# Patient Record
Sex: Female | Born: 1980
Health system: Southern US, Community
[De-identification: ages and names within clinical notes are randomized; demographics above are authoritative.]

## PROBLEM LIST (undated history)

## (undated) ENCOUNTER — Inpatient Hospital Stay (HOSPITAL_COMMUNITY): Payer: Self-pay

## (undated) DIAGNOSIS — E669 Obesity, unspecified: Secondary | ICD-10-CM

## (undated) DIAGNOSIS — R519 Headache, unspecified: Secondary | ICD-10-CM

## (undated) DIAGNOSIS — Z87891 Personal history of nicotine dependence: Secondary | ICD-10-CM

## (undated) DIAGNOSIS — K219 Gastro-esophageal reflux disease without esophagitis: Secondary | ICD-10-CM

## (undated) DIAGNOSIS — IMO0002 Reserved for concepts with insufficient information to code with codable children: Secondary | ICD-10-CM

## (undated) DIAGNOSIS — R87629 Unspecified abnormal cytological findings in specimens from vagina: Secondary | ICD-10-CM

## (undated) DIAGNOSIS — K449 Diaphragmatic hernia without obstruction or gangrene: Secondary | ICD-10-CM

## (undated) DIAGNOSIS — R51 Headache: Secondary | ICD-10-CM

## (undated) DIAGNOSIS — E079 Disorder of thyroid, unspecified: Secondary | ICD-10-CM

## (undated) DIAGNOSIS — B009 Herpesviral infection, unspecified: Secondary | ICD-10-CM

## (undated) DIAGNOSIS — F32A Depression, unspecified: Secondary | ICD-10-CM

## (undated) DIAGNOSIS — Z8669 Personal history of other diseases of the nervous system and sense organs: Secondary | ICD-10-CM

## (undated) DIAGNOSIS — T7840XA Allergy, unspecified, initial encounter: Secondary | ICD-10-CM

## (undated) HISTORY — PX: CHOLECYSTECTOMY: SHX55

## (undated) HISTORY — DX: Obesity, unspecified: E66.9

## (undated) HISTORY — DX: Depression, unspecified: F32.A

## (undated) HISTORY — DX: Allergy, unspecified, initial encounter: T78.40XA

## (undated) HISTORY — DX: Disorder of thyroid, unspecified: E07.9

## (undated) HISTORY — DX: Diaphragmatic hernia without obstruction or gangrene: K44.9

## (undated) HISTORY — DX: Personal history of other diseases of the nervous system and sense organs: Z86.69

## (undated) HISTORY — DX: Personal history of nicotine dependence: Z87.891

## (undated) HISTORY — DX: Gastro-esophageal reflux disease without esophagitis: K21.9

## (undated) HISTORY — DX: Herpesviral infection, unspecified: B00.9

## (undated) HISTORY — PX: ESOPHAGOGASTRODUODENOSCOPY: SHX1529

## (undated) HISTORY — DX: Reserved for concepts with insufficient information to code with codable children: IMO0002

---

## 2000-11-18 DIAGNOSIS — R87619 Unspecified abnormal cytological findings in specimens from cervix uteri: Secondary | ICD-10-CM

## 2000-11-18 DIAGNOSIS — IMO0002 Reserved for concepts with insufficient information to code with codable children: Secondary | ICD-10-CM

## 2000-11-18 HISTORY — PX: TONSILLECTOMY AND ADENOIDECTOMY: SHX28

## 2000-11-18 HISTORY — PX: COLPOSCOPY: SHX161

## 2000-11-18 HISTORY — DX: Unspecified abnormal cytological findings in specimens from cervix uteri: R87.619

## 2000-11-18 HISTORY — DX: Reserved for concepts with insufficient information to code with codable children: IMO0002

## 2002-11-18 HISTORY — PX: CHOLECYSTECTOMY, LAPAROSCOPIC: SHX56

## 2004-01-18 ENCOUNTER — Other Ambulatory Visit: Admission: RE | Admit: 2004-01-18 | Discharge: 2004-01-18 | Payer: Self-pay | Admitting: Obstetrics and Gynecology

## 2004-07-20 ENCOUNTER — Other Ambulatory Visit: Admission: RE | Admit: 2004-07-20 | Discharge: 2004-07-20 | Payer: Self-pay | Admitting: *Deleted

## 2005-03-19 ENCOUNTER — Other Ambulatory Visit: Admission: RE | Admit: 2005-03-19 | Discharge: 2005-03-19 | Payer: Self-pay | Admitting: Obstetrics and Gynecology

## 2006-02-27 ENCOUNTER — Other Ambulatory Visit: Admission: RE | Admit: 2006-02-27 | Discharge: 2006-02-27 | Payer: Self-pay | Admitting: Obstetrics & Gynecology

## 2006-04-25 ENCOUNTER — Other Ambulatory Visit: Admission: RE | Admit: 2006-04-25 | Discharge: 2006-04-25 | Payer: Self-pay | Admitting: Obstetrics & Gynecology

## 2007-04-27 ENCOUNTER — Other Ambulatory Visit: Admission: RE | Admit: 2007-04-27 | Discharge: 2007-04-27 | Payer: Self-pay | Admitting: Obstetrics and Gynecology

## 2007-07-17 ENCOUNTER — Other Ambulatory Visit: Admission: RE | Admit: 2007-07-17 | Discharge: 2007-07-17 | Payer: Self-pay | Admitting: Obstetrics and Gynecology

## 2007-10-28 ENCOUNTER — Other Ambulatory Visit: Admission: RE | Admit: 2007-10-28 | Discharge: 2007-10-28 | Payer: Self-pay | Admitting: Obstetrics and Gynecology

## 2008-05-02 ENCOUNTER — Other Ambulatory Visit: Admission: RE | Admit: 2008-05-02 | Discharge: 2008-05-02 | Payer: Self-pay | Admitting: Obstetrics and Gynecology

## 2010-02-16 DIAGNOSIS — B009 Herpesviral infection, unspecified: Secondary | ICD-10-CM

## 2010-02-16 HISTORY — DX: Herpesviral infection, unspecified: B00.9

## 2011-01-17 ENCOUNTER — Other Ambulatory Visit: Payer: Self-pay | Admitting: Gastroenterology

## 2011-01-17 DIAGNOSIS — R1012 Left upper quadrant pain: Secondary | ICD-10-CM

## 2011-01-22 ENCOUNTER — Ambulatory Visit
Admission: RE | Admit: 2011-01-22 | Discharge: 2011-01-22 | Disposition: A | Discharge status: Home / Self Care | Payer: BC Managed Care – PPO | Source: Ambulatory Visit | Attending: Gastroenterology | Admitting: Gastroenterology

## 2011-01-22 DIAGNOSIS — R1012 Left upper quadrant pain: Secondary | ICD-10-CM

## 2011-01-22 MED ORDER — IOHEXOL 300 MG/ML  SOLN
100.0000 mL | Freq: Once | INTRAMUSCULAR | Status: AC | PRN
  Administered 2011-01-22: 100 mL via INTRAVENOUS

## 2011-11-17 ENCOUNTER — Ambulatory Visit (INDEPENDENT_AMBULATORY_CARE_PROVIDER_SITE_OTHER): Payer: BC Managed Care – PPO

## 2011-11-17 DIAGNOSIS — J029 Acute pharyngitis, unspecified: Secondary | ICD-10-CM

## 2011-11-17 DIAGNOSIS — J069 Acute upper respiratory infection, unspecified: Secondary | ICD-10-CM

## 2012-07-24 ENCOUNTER — Ambulatory Visit (INDEPENDENT_AMBULATORY_CARE_PROVIDER_SITE_OTHER): Payer: BC Managed Care – PPO | Admitting: Physician Assistant

## 2012-07-24 VITALS — BP 103/67 | HR 59 | Temp 97.8°F | Resp 16 | Ht 63.0 in | Wt 145.0 lb

## 2012-07-24 DIAGNOSIS — R21 Rash and other nonspecific skin eruption: Secondary | ICD-10-CM

## 2012-07-24 DIAGNOSIS — J029 Acute pharyngitis, unspecified: Secondary | ICD-10-CM

## 2012-07-24 LAB — POCT CBC
Granulocyte percent: 58 %G (ref 37–80)
HCT, POC: 46.3 % (ref 37.7–47.9)
Hemoglobin: 14.4 g/dL (ref 12.2–16.2)
Lymph, poc: 2.9 (ref 0.6–3.4)
MCH, POC: 30.4 pg (ref 27–31.2)
MCHC: 31.1 g/dL — AB (ref 31.8–35.4)
MCV: 97.7 fL — AB (ref 80–97)
MID (cbc): 0.7 (ref 0–0.9)
MPV: 8.9 fL (ref 0–99.8)
POC Granulocyte: 5 (ref 2–6.9)
POC LYMPH PERCENT: 33.9 %L (ref 10–50)
POC MID %: 8.1 %M (ref 0–12)
Platelet Count, POC: 379 10*3/uL (ref 142–424)
RBC: 4.74 M/uL (ref 4.04–5.48)
RDW, POC: 12.7 %
WBC: 8.6 10*3/uL (ref 4.6–10.2)

## 2012-07-24 MED ORDER — DOXYCYCLINE HYCLATE 100 MG PO TABS: 100.0000 mg | ORAL_TABLET | Freq: Two times a day (BID) | ORAL | Status: DC

## 2012-07-24 NOTE — Progress Notes (Signed)
Subjective:    Patient ID: [name] [name], female    DOB: November 27, 1980, 31 y.o.   MRN: [id number]  HPI Pt presents to clinic with several problems that she is not sure if they are related.  About 2 wks ago she developed cold like symptoms with congestion and sore throat with fatigue and 24h fever at 101F.  The symptoms have mostly resolved but she still has intermittent sore throat throughout the day.  About the same time frame she noticed a bump on her L side that had a dark center but ignored it.  Then over the last couple of days since she has not been feeling normal she relooked at the spot on her L side and noticed that the rash was bigger but the dark spot was gone.  She then got concerned that the dark area might have been a tick.  She is not really having myalgias.   Review of Systems  Constitutional: Positive for fever (resolved) and fatigue. Negative for chills.  HENT: Positive for congestion (improved) and sore throat (improved no exposures to strep known).   Respiratory: Negative for cough.   Skin: Positive for rash (no itching or pain).  Neurological: Negative for headaches.       Objective:   Physical Exam  Vitals reviewed. Constitutional: She is oriented to person, place, and time. She appears well-developed and well-nourished.  HENT:  Head: Normocephalic and atraumatic.  Right Ear: Hearing, tympanic membrane, external ear and ear canal normal.  Left Ear: Hearing, tympanic membrane, external ear and ear canal normal.  Nose: Nose normal.  Mouth/Throat: Uvula is midline.  Neck: Neck supple.  Cardiovascular: Normal rate, regular rhythm and normal heart sounds.  Exam reveals no gallop.   No murmur heard. Pulmonary/Chest: Effort normal and breath sounds normal.  Lymphadenopathy:    She has cervical adenopathy (AC nodes 1+ nontender).  Neurological: She is alert and oriented to person, place, and time.  Skin: Skin is warm and dry. Petechiae and rash noted. Rash is  urticarial.     Psychiatric: She has a normal mood and affect. Her behavior is normal. Judgment and thought content normal.     Results for orders placed in visit on 07/24/12  POCT CBC      Component Value Range   WBC 8.6  4.6 - 10.2 K/uL   Lymph, poc 2.9  0.6 - 3.4   POC LYMPH PERCENT 33.9  10 - 50 %L   MID (cbc) 0.7  0 - 0.9   POC MID % 8.1  0 - 12 %M   POC Granulocyte 5.0  2 - 6.9   Granulocyte percent 58.0  37 - 80 %G   RBC 4.74  4.04 - 5.48 M/uL   Hemoglobin 14.4  12.2 - 16.2 g/dL   HCT, POC 78.4  69.6 - 47.9 %   MCV 97.7 (*) 80 - 97 fL   MCH, POC 30.4  27 - 31.2 pg   MCHC 31.1 (*) 31.8 - 35.4 g/dL   RDW, POC 29.5     Platelet Count, POC 379  142 - 424 K/uL   MPV 8.9  0 - 99.8 fL      Assessment & Plan:   1. Rash  POCT CBC, doxycycline (VIBRA-TABS) 100 MG tablet  2. Sore throat  POCT CBC   I think pt did have a tick bite even though she did not pull off the tick so I will cover for Lyme disease.  Pt can use OTC  antihistamine for allergic reaction.  Can continue to use OTC hydrocortisone crm.

## 2012-07-26 ENCOUNTER — Ambulatory Visit (INDEPENDENT_AMBULATORY_CARE_PROVIDER_SITE_OTHER): Payer: BC Managed Care – PPO | Admitting: Family Medicine

## 2012-07-26 VITALS — BP 121/77 | HR 76 | Temp 98.1°F | Resp 16 | Ht 63.0 in | Wt 144.0 lb

## 2012-07-26 DIAGNOSIS — T148 Other injury of unspecified body region: Secondary | ICD-10-CM

## 2012-07-26 DIAGNOSIS — R21 Rash and other nonspecific skin eruption: Secondary | ICD-10-CM

## 2012-07-26 DIAGNOSIS — A6920 Lyme disease, unspecified: Secondary | ICD-10-CM

## 2012-07-26 DIAGNOSIS — W57XXXA Bitten or stung by nonvenomous insect and other nonvenomous arthropods, initial encounter: Secondary | ICD-10-CM

## 2012-07-26 LAB — POCT CBC
Granulocyte percent: 68.8 %G (ref 37–80)
HCT, POC: 46.2 % (ref 37.7–47.9)
Hemoglobin: 14.3 g/dL (ref 12.2–16.2)
Lymph, poc: 2.3 (ref 0.6–3.4)
MCH, POC: 30.1 pg (ref 27–31.2)
MCHC: 31 g/dL — AB (ref 31.8–35.4)
MCV: 97.3 fL — AB (ref 80–97)
MID (cbc): 0.6 (ref 0–0.9)
MPV: 8.9 fL (ref 0–99.8)
POC Granulocyte: 6.4 (ref 2–6.9)
POC LYMPH PERCENT: 24.8 %L (ref 10–50)
POC MID %: 6.4 %M (ref 0–12)
Platelet Count, POC: 360 10*3/uL (ref 142–424)
RBC: 4.75 M/uL (ref 4.04–5.48)
RDW, POC: 13.1 %
WBC: 9.3 10*3/uL (ref 4.6–10.2)

## 2012-07-26 MED ORDER — DOXYCYCLINE HYCLATE 100 MG PO TABS: 100.0000 mg | ORAL_TABLET | Freq: Two times a day (BID) | ORAL | Status: AC

## 2012-07-26 NOTE — Progress Notes (Signed)
Subjective: Patient was concerned because the rash seems to have gotten a little worse. It feels hot to touch. Does not really itch much. She has a few little paresthesias, but she says she gets that from her other medication anyhow. She is concerned about the possibility of lyme.  Objective: No lymphadenopathy. No hepatosplenomegaly. Rash measured 20 x 9.3 cm. We took a photograph.  Assessment: Dermatitis, probably secondary to Lyme disease  Plan: Lyme titer and CBC. Continue the doxycycline for a total of 3 weeks.  Results for orders placed in visit on 07/26/12  POCT CBC      Component Value Range   WBC 9.3  4.6 - 10.2 K/uL   Lymph, poc 2.3  0.6 - 3.4   POC LYMPH PERCENT 24.8  10 - 50 %L   MID (cbc) 0.6  0 - 0.9   POC MID % 6.4  0 - 12 %M   POC Granulocyte 6.4  2 - 6.9   Granulocyte percent 68.8  37 - 80 %G   RBC 4.75  4.04 - 5.48 M/uL   Hemoglobin 14.3  12.2 - 16.2 g/dL   HCT, POC 04.5  40.9 - 47.9 %   MCV 97.3 (*) 80 - 97 fL   MCH, POC 30.1  27 - 31.2 pg   MCHC 31.0 (*) 31.8 - 35.4 g/dL   RDW, POC 81.1     Platelet Count, POC 360  142 - 424 K/uL   MPV 8.9  0 - 99.8 fL

## 2012-07-26 NOTE — Patient Instructions (Signed)
Continue the doxycycline for 3 weeks. I called him one refill for you.  Return in one week, sooner if problems

## 2012-07-28 LAB — B. BURGDORFI ANTIBODIES: B burgdorferi Ab IgG+IgM: 0.46 {ISR}

## 2012-07-30 ENCOUNTER — Other Ambulatory Visit: Payer: Self-pay | Admitting: Family Medicine

## 2012-07-30 DIAGNOSIS — R21 Rash and other nonspecific skin eruption: Secondary | ICD-10-CM

## 2012-08-04 ENCOUNTER — Telehealth: Payer: Self-pay

## 2012-08-04 DIAGNOSIS — R21 Rash and other nonspecific skin eruption: Secondary | ICD-10-CM

## 2012-08-04 NOTE — Telephone Encounter (Signed)
[name] would like referral to Dr. Lovenia Kim at [facility] if can be seen within a month for rash.  If not, then first available dermatology office.    [phone]

## 2012-08-04 NOTE — Telephone Encounter (Signed)
Dr. Alwyn Ren, do you want to refer patient or have them RTC?

## 2012-08-04 NOTE — Telephone Encounter (Signed)
Make patient a referral to dermatology.( If necessary pass this message on to [place].  )  Called patient, and tell her that if the rash is worse or if she is feeling worse she should come back in before she sees the dermatologist if it takes too long, but proceed on making the referral.

## 2012-08-05 NOTE — Telephone Encounter (Signed)
Referral made, have called patient to advise.

## 2012-08-18 ENCOUNTER — Other Ambulatory Visit (INDEPENDENT_AMBULATORY_CARE_PROVIDER_SITE_OTHER): Payer: BC Managed Care – PPO | Admitting: Family Medicine

## 2012-08-18 VITALS — BP 100/60 | Wt 146.6 lb

## 2012-08-18 DIAGNOSIS — A6920 Lyme disease, unspecified: Secondary | ICD-10-CM

## 2012-08-18 DIAGNOSIS — R21 Rash and other nonspecific skin eruption: Secondary | ICD-10-CM

## 2012-08-19 ENCOUNTER — Encounter: Payer: Self-pay | Admitting: Family Medicine

## 2012-08-19 LAB — B. BURGDORFI ANTIBODIES: B burgdorferi Ab IgG+IgM: 1.09 {ISR} — ABNORMAL HIGH

## 2012-08-20 ENCOUNTER — Telehealth: Payer: Self-pay

## 2012-08-20 LAB — B. BURGDORFI ANTIBODIES BY WB
B burgdorferi IgG Abs (IB): NEGATIVE
B burgdorferi IgM Abs (IB): NEGATIVE

## 2012-08-20 NOTE — Telephone Encounter (Signed)
Pt is calling about blood work results, please contact pt [phone]

## 2012-08-21 LAB — HM PAP SMEAR

## 2012-08-21 NOTE — Telephone Encounter (Signed)
Her Lyme antibody test done on 08/18/12 did come back higher than her initial test 3 weeks ago, but it still falls in the "equivocal" range, meaning we do not definitively know if she was in fact exposed to Lyme.  However, she has completed a course of doxycycline, which is the treatment of choice, so there is no further evaluation or treatment that she needs.

## 2012-08-21 NOTE — Telephone Encounter (Signed)
I see that Dr Alwyn Ren reviewed the results, but do not see any comments from him as to what to advise pt about results. This was a repeat test done two weeks after initial test - checking to see if the result increased since first test. Can you please comment on results vs previous results and what that means for pt's Dx of Lyme?

## 2012-08-21 NOTE — Telephone Encounter (Signed)
Patient is wondering what the results are, nobody ever called her to tell her we had received the results and what they are.  Best [phone]

## 2012-08-21 NOTE — Telephone Encounter (Signed)
Please review

## 2012-08-21 NOTE — Telephone Encounter (Signed)
Labs have been reviewed, what are her specific questions?

## 2012-08-21 NOTE — Telephone Encounter (Signed)
Explained results to pt and that she has had the tx of choice for Lyme. Pt reports that the rash did resolve after about 12 days of tx, but she is still experiencing a great deal of fatigue. Advised pt that she should RTC if fatigue persists/worsens. Pt agreed.

## 2012-08-23 ENCOUNTER — Telehealth: Payer: Self-pay

## 2012-09-02 ENCOUNTER — Ambulatory Visit (INDEPENDENT_AMBULATORY_CARE_PROVIDER_SITE_OTHER): Payer: BC Managed Care – PPO | Admitting: Family Medicine

## 2012-09-02 VITALS — BP 100/64 | HR 63 | Temp 97.9°F | Resp 16 | Ht 63.0 in | Wt 147.0 lb

## 2012-09-02 DIAGNOSIS — R5383 Other fatigue: Secondary | ICD-10-CM

## 2012-09-02 DIAGNOSIS — R5381 Other malaise: Secondary | ICD-10-CM

## 2012-09-02 LAB — COMPREHENSIVE METABOLIC PANEL
ALT: 9 U/L (ref 0–35)
AST: 12 U/L (ref 0–37)
Albumin: 4.2 g/dL (ref 3.5–5.2)
Alkaline Phosphatase: 51 U/L (ref 39–117)
BUN: 15 mg/dL (ref 6–23)
CO2: 20 mEq/L (ref 19–32)
Calcium: 9.3 mg/dL (ref 8.4–10.5)
Chloride: 110 mEq/L (ref 96–112)
Creat: 0.81 mg/dL (ref 0.50–1.10)
Glucose, Bld: 80 mg/dL (ref 70–99)
Potassium: 4.4 mEq/L (ref 3.5–5.3)
Sodium: 140 mEq/L (ref 135–145)
Total Bilirubin: 0.5 mg/dL (ref 0.3–1.2)
Total Protein: 6.9 g/dL (ref 6.0–8.3)

## 2012-09-02 LAB — POCT CBC
Granulocyte percent: 51.8 %G (ref 37–80)
HCT, POC: 47.1 % (ref 37.7–47.9)
Hemoglobin: 14.5 g/dL (ref 12.2–16.2)
Lymph, poc: 2.5 (ref 0.6–3.4)
MCH, POC: 30.1 pg (ref 27–31.2)
MCHC: 30.8 g/dL — AB (ref 31.8–35.4)
MCV: 97.9 fL — AB (ref 80–97)
MID (cbc): 0.5 (ref 0–0.9)
MPV: 8.9 fL (ref 0–99.8)
POC Granulocyte: 3.2 (ref 2–6.9)
POC LYMPH PERCENT: 40.4 %L (ref 10–50)
POC MID %: 7.8 %M (ref 0–12)
Platelet Count, POC: 353 10*3/uL (ref 142–424)
RBC: 4.81 M/uL (ref 4.04–5.48)
RDW, POC: 13.7 %
WBC: 6.1 10*3/uL (ref 4.6–10.2)

## 2012-09-02 LAB — TSH: TSH: 1.289 u[IU]/mL (ref 0.350–4.500)

## 2012-09-02 LAB — POCT SEDIMENTATION RATE: POCT SED RATE: 27 mm/hr — AB (ref 0–22)

## 2012-09-02 NOTE — Progress Notes (Signed)
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  She was tested for Lyme and treated for it x 3 weeks with doxycycline.  She saw a dermatologist last week who felt that clinically the patient had Lyme rash.  Now for several weeks she has had extreme exhaustion.  Objective:  We reviewed her rash photograph from her i-phone  Alert, cooperative. Eyes, including fundi, normal Tm's normal Oroph: normal Neck: no adenopathy Chest:  Clear Heart: reg without gallop or murmur Abdomen: no HSM, tenderness or mass Skin:  Clear  Assessment: extreme fatigue following bull's eye rash on left flank  Plan: 1. Fatigue  POCT CBC, POCT SEDIMENTATION RATE, Epstein-Barr virus VCA antibody panel, Comprehensive metabolic panel

## 2012-09-03 LAB — EPSTEIN-BARR VIRUS VCA ANTIBODY PANEL
EBV EA IgG: <5 U/mL (ref <9.0)
EBV NA IgG: 58.5 U/mL — ABNORMAL HIGH (ref <18.0)
EBV VCA IgG: 567 U/mL — ABNORMAL HIGH (ref <18.0)
EBV VCA IgM: <10 U/mL (ref <36.0)

## 2013-02-05 ENCOUNTER — Telehealth: Payer: Self-pay | Admitting: Nurse Practitioner

## 2013-02-05 NOTE — Telephone Encounter (Signed)
Patient started Wellbutrin 6 weeks ago and reports that although symptoms have improved she has had hair loss that started subtly and has increased to very noticeable.  She likes this med but not willing to tolerate this side effect.  Would like to try SSRI again even though she did not like in the past.  Advised patient she may need OV but will check with provider and call Monday.

## 2013-02-05 NOTE — Telephone Encounter (Signed)
[name] [name] is she willing to see Lyanne Co first and see which SSRI he suggest for her situation.  Or He can call me to discuss. Thanks, [employer]

## 2013-02-05 NOTE — Telephone Encounter (Signed)
Pt is having side effects

## 2013-02-05 NOTE — Telephone Encounter (Signed)
Pt is having a side effect to a medication

## 2013-02-07 ENCOUNTER — Telehealth: Payer: Self-pay | Admitting: *Deleted

## 2013-02-08 ENCOUNTER — Telehealth: Payer: Self-pay | Admitting: *Deleted

## 2013-02-08 NOTE — Telephone Encounter (Signed)
PATIENT NOTIFIED OF PATTY GRUBB RESPONSE TO MEDICATION MANAGEMENT OF WELLBUTRIN. PATIENT STATES WILL CALL DR. ED LUREY TO DISCUSS THIS . SUE

## 2013-02-09 ENCOUNTER — Telehealth: Payer: Self-pay | Admitting: *Deleted

## 2013-02-09 NOTE — Telephone Encounter (Signed)
Kennon Rounds did you talk to Henrietta again? Thanks Patty.  I still have her on my in basket, just making sure

## 2013-02-10 NOTE — Telephone Encounter (Signed)
Dr Lovett Sox calling to talk to you concerning Thomas. Please return the call to [phone]

## 2013-02-11 MED ORDER — ESCITALOPRAM OXALATE 10 MG PO TABS: 10.0000 mg | ORAL_TABLET | Freq: Every day | ORAL | Status: DC

## 2013-02-11 NOTE — Telephone Encounter (Signed)
Dr. Lyanne Co calls back 02/11/13 and feels that patient would do well on Lexapro low dose and titrate up.  He will continue to see her and monitor outcome of meds.  Would you send order for Lexapro 10 mg 1 tab daily #90/ no refill. To pharmacy and let pt. Know RX is sent in. Thanks, PG

## 2013-02-11 NOTE — Telephone Encounter (Signed)
PATIENT NOTIFIED OF RESPONSE FROM DR. E. LUREY AND PATTY GRUBB. MEDICATION SENT IN TO [facility],[place].

## 2013-04-29 ENCOUNTER — Telehealth: Payer: Self-pay | Admitting: Nurse Practitioner

## 2013-04-29 ENCOUNTER — Other Ambulatory Visit: Payer: Self-pay | Admitting: Nurse Practitioner

## 2013-04-29 MED ORDER — ESCITALOPRAM OXALATE 10 MG PO TABS: 10.0000 mg | ORAL_TABLET | Freq: Every day | ORAL | Status: DC

## 2013-04-29 NOTE — Telephone Encounter (Signed)
Dr. Lyanne Co calls with a progress report on [name] [name].  He thinks she is doing very well on Lexapro and he wants her to continue with med's.  She felt the Wellbutrin makes her hair thinned out so is not on this.  He will continue to see her in therapy.  She will need a refill on med's.  That will be sent to pharmacy.

## 2013-08-19 ENCOUNTER — Other Ambulatory Visit: Payer: Self-pay | Admitting: Nurse Practitioner

## 2013-08-19 NOTE — Telephone Encounter (Signed)
Rx denied refill given 04/29/13 #90 w/4 refills cm

## 2013-10-19 ENCOUNTER — Other Ambulatory Visit: Payer: Self-pay | Admitting: Nurse Practitioner

## 2013-10-25 ENCOUNTER — Encounter: Payer: Self-pay | Admitting: Nurse Practitioner

## 2013-10-25 ENCOUNTER — Ambulatory Visit (INDEPENDENT_AMBULATORY_CARE_PROVIDER_SITE_OTHER): Payer: BC Managed Care – PPO | Admitting: Nurse Practitioner

## 2013-10-25 VITALS — BP 112/70 | HR 60 | Resp 16 | Ht 63.25 in | Wt 161.0 lb

## 2013-10-25 DIAGNOSIS — Z0000 Encounter for general adult medical examination without abnormal findings: Secondary | ICD-10-CM

## 2013-10-25 DIAGNOSIS — R8290 Unspecified abnormal findings in urine: Secondary | ICD-10-CM

## 2013-10-25 DIAGNOSIS — R82998 Other abnormal findings in urine: Secondary | ICD-10-CM

## 2013-10-25 DIAGNOSIS — Z01419 Encounter for gynecological examination (general) (routine) without abnormal findings: Secondary | ICD-10-CM

## 2013-10-25 LAB — POCT URINALYSIS DIPSTICK
Bilirubin, UA: NEGATIVE
Blood, UA: NEGATIVE
Glucose, UA: NEGATIVE
Ketones, UA: NEGATIVE
Nitrite, UA: NEGATIVE
Protein, UA: NEGATIVE
Urobilinogen, UA: NEGATIVE
pH, UA: 8

## 2013-10-25 LAB — HEMOGLOBIN, FINGERSTICK: Hemoglobin, fingerstick: 14.9 g/dL (ref 12.0–16.0)

## 2013-10-25 MED ORDER — NONFORMULARY OR COMPOUNDED ITEM: Status: DC

## 2013-10-25 MED ORDER — METRONIDAZOLE 0.75 % VA GEL: 1.0000 | Freq: Every day | VAGINAL | Status: DC

## 2013-10-25 MED ORDER — NORETHIN ACE-ETH ESTRAD-FE 1-20 MG-MCG PO TABS: ORAL_TABLET | ORAL | Status: DC

## 2013-10-25 NOTE — Progress Notes (Signed)
Patient ID: [name] [name], female   DOB: 1980-11-28, 32 y.o.   MRN: [id number] 32 y.o. G0P0 Single Caucasian Fe here for annual exam.   Menses lasting 4 days. Moderate to light.  Not SA in 3 years.  Still seeing Dr. Cyndia Skeeters and now reduced Lexapro to 5 mg. for 1 month.  No change in appetite but now has regained her weight.   Now on gluten free diet.  Has vaginal discharge with odor on and off.  Has recently been off Boric acid capsules.    Patient's last menstrual period was 07/26/2013.          Sexually active: no  The current method of family planning is OCP (estrogen/progesterone).    Exercising: yes  walking one mile daily Smoker:  no  Health Maintenance: Pap: 08/21/12, WNL TD: 2004 - needs TDaP will get at PCP Gardasil: completed in 2007 Labs: HB: 14.9  Urine: 1+ WBC, pH 8.0   reports that she has quit smoking. She has never used smokeless tobacco. She reports that she drinks about 1.0 ounces of alcohol per week. She reports that she does not use illicit drugs.  Past Medical History  Diagnosis Date  . Former smoker   . Obesity   . HSV-1 (herpes simplex virus 1) infection 4/11    positive on peri-anal c & s  . Abnormal Pap smear 2002    Colpo and Laser HPV    Past Surgical History  Procedure Laterality Date  . Cholecystectomy, laparoscopic  2004  . Tonsillectomy and adenoidectomy  2002  . Colposcopy  2002    HPV, laser    Current Outpatient Prescriptions  Medication Sig Dispense Refill  . escitalopram (LEXAPRO) 10 MG tablet Take 5 mg by mouth daily.      . montelukast (SINGULAIR) 10 MG tablet Take 10 mg by mouth at bedtime.      . norethindrone-ethinyl estradiol (JUNEL FE,GILDESS FE,LOESTRIN FE) 1-20 MG-MCG tablet 1 tablet daily of continuous active pills.  4 packs=3 months  4 Package  3  . rizatriptan (MAXALT) 10 MG tablet Take 10 mg by mouth as needed. May repeat in 2 hours if needed      . topiramate (TOPAMAX) 200 MG tablet Take 200 mg by mouth 2 (two) times daily.       . valACYclovir (VALTREX) 500 MG tablet Take 500 mg by mouth 2 (two) times daily.      . metroNIDAZOLE (METROGEL) 0.75 % vaginal gel Place 1 Applicatorful vaginally at bedtime.  70 g  0  . NONFORMULARY OR COMPOUNDED ITEM Boric acid suppository 200mg , size 0 gelatin capsule,1 pv twice weekly.  With flare use daily pv for 3 days.  30 each  12   No current facility-administered medications for this visit.    Family History  Problem Relation Age of Onset  . Family history unknown: Yes    ROS:  Pertinent items are noted in HPI.  Otherwise, a comprehensive ROS was negative.  Exam:   BP 112/70  Pulse 60  Resp 16  Ht 5' 3.25" (1.607 m)  Wt 161 lb (73.029 kg)  BMI 28.28 kg/m2  LMP 07/26/2013 Height: 5' 3.25" (160.7 cm)  Ht Readings from Last 3 Encounters:  10/25/13 5' 3.25" (1.607 m)  09/02/12 5\' 3"  (1.6 m)  07/26/12 5\' 3"  (1.6 m)  weight 139 in January 2014  General appearance: alert, cooperative and appears stated age Head: Normocephalic, without obvious abnormality, atraumatic Neck: no adenopathy, supple, symmetrical, trachea midline and  thyroid normal to inspection and palpation Lungs: clear to auscultation bilaterally Breasts: normal appearance, no masses or tenderness Heart: regular rate and rhythm Abdomen: soft, non-tender; no masses,  no organomegaly.  No mass but there is a small lymph node or lipoma right mid abdomen that is non tender and mobile. Extremities: extremities normal, atraumatic, no cyanosis or edema Skin: Skin color, texture, turgor normal. No rashes or lesions Lymph nodes: Cervical, supraclavicular, and axillary nodes normal. No abnormal inguinal nodes palpated Neurologic: Grossly normal   Pelvic: External genitalia:  no lesions              Urethra:  normal appearing urethra with no masses, tenderness or lesions              Bartholin's and Skene's: normal                 Vagina: normal appearing vagina with normal color and discharge, no lesions               Cervix: anteverted              Pap taken: no Bimanual Exam:  Uterus:  normal size, contour, position, consistency, mobility, non-tender              Adnexa: no mass, fullness, tenderness               Rectovaginal: Confirms               Anus:  normal sphincter tone, no lesions  A:  Well Woman with normal exam  Contraception for cycle regulation  Weight gain - wants a recheck on thyroid   History of vaginal odor on / off with history of BV  Recent lower abdominal pain with coughing episode that has improved  History of abnormal pap 2002 - normal since  P:   Pap smear as per guidelines not done  Refill on Boric acid capsule and Metrogel to use prn after menses  Will follow with labs.  If continues with pain in abdomen to call back. (CT scan of abdomen 2012 normal)   Counseled on breast self exam, adequate intake of calcium and vitamin D, diet and exercise return annually or prn  An After Visit Summary was printed and given to the patient.

## 2013-10-25 NOTE — Patient Instructions (Signed)

## 2013-10-26 LAB — LIPID PANEL
Cholesterol: 143 mg/dL (ref 0–200)
HDL: 71 mg/dL (ref >39)
LDL Cholesterol: 61 mg/dL (ref 0–99)
Total CHOL/HDL Ratio: 2 Ratio
Triglycerides: 53 mg/dL (ref <150)
VLDL: 11 mg/dL (ref 0–40)

## 2013-10-26 LAB — THYROID PANEL WITH TSH
Free Thyroxine Index: 3.5 (ref 1.0–3.9)
T3 Uptake: 29.1 % (ref 22.5–37.0)
T4, Total: 12.1 ug/dL (ref 5.0–12.5)
TSH: 1.302 u[IU]/mL (ref 0.350–4.500)

## 2013-10-28 LAB — URINE CULTURE: Colony Count: 50000

## 2013-10-28 NOTE — Progress Notes (Signed)
Reviewed personally.  M. Suzanne Zulay Corrie, MD.  

## 2014-07-06 ENCOUNTER — Other Ambulatory Visit: Payer: Self-pay | Admitting: Nurse Practitioner

## 2014-07-06 NOTE — Telephone Encounter (Signed)
Last AEX 10/25/13 Last refill 04/29/13 #90/ 4 R No future Appt.   Please advise.

## 2014-07-06 NOTE — Telephone Encounter (Signed)
Patient will need phone call of status with medication, per last note she was only taking 5 mg.  And is she in counseling.

## 2014-07-06 NOTE — Telephone Encounter (Signed)
LM to call back.

## 2014-07-07 NOTE — Telephone Encounter (Signed)
S/W with pt and she stated she is seeing Dr. Rubye OaksLaury once a month. Pt states she started on 10 mg and then went to 5mg  and she has been back on 10mg  for the past 3mths. AEX has been scheduled for 10/31/14

## 2014-10-31 ENCOUNTER — Ambulatory Visit: Payer: Self-pay | Admitting: Nurse Practitioner

## 2014-11-24 ENCOUNTER — Ambulatory Visit (INDEPENDENT_AMBULATORY_CARE_PROVIDER_SITE_OTHER): Payer: BLUE CROSS/BLUE SHIELD | Admitting: Nurse Practitioner

## 2014-11-24 ENCOUNTER — Encounter: Payer: Self-pay | Admitting: Nurse Practitioner

## 2014-11-24 VITALS — BP 110/64 | HR 60 | Ht 63.25 in | Wt 176.0 lb

## 2014-11-24 DIAGNOSIS — Z0000 Encounter for general adult medical examination without abnormal findings: Secondary | ICD-10-CM

## 2014-11-24 DIAGNOSIS — Z01419 Encounter for gynecological examination (general) (routine) without abnormal findings: Secondary | ICD-10-CM

## 2014-11-24 DIAGNOSIS — N760 Acute vaginitis: Secondary | ICD-10-CM

## 2014-11-24 DIAGNOSIS — A609 Anogenital herpesviral infection, unspecified: Secondary | ICD-10-CM

## 2014-11-24 DIAGNOSIS — Z658 Other specified problems related to psychosocial circumstances: Secondary | ICD-10-CM

## 2014-11-24 DIAGNOSIS — F439 Reaction to severe stress, unspecified: Secondary | ICD-10-CM

## 2014-11-24 LAB — POCT URINALYSIS DIPSTICK
Bilirubin, UA: NEGATIVE
Blood, UA: NEGATIVE
Glucose, UA: NEGATIVE
Ketones, UA: NEGATIVE
Leukocytes, UA: NEGATIVE
Nitrite, UA: NEGATIVE
Protein, UA: NEGATIVE
Urobilinogen, UA: NEGATIVE
pH, UA: 7

## 2014-11-24 MED ORDER — NORETHIN ACE-ETH ESTRAD-FE 1-20 MG-MCG PO TABS: ORAL_TABLET | ORAL | Status: DC

## 2014-11-24 MED ORDER — ESCITALOPRAM OXALATE 10 MG PO TABS: 10.0000 mg | ORAL_TABLET | Freq: Every day | ORAL | Status: DC

## 2014-11-24 MED ORDER — VALACYCLOVIR HCL 500 MG PO TABS: 500.0000 mg | ORAL_TABLET | Freq: Two times a day (BID) | ORAL | Status: DC

## 2014-11-24 NOTE — Progress Notes (Signed)
Patient ID: [name] [name], female   DOB: 1981/09/30, 34 y.o.   MRN: [id number] 34 y.o. G0P0 Single Caucasian Fe here for annual exam.  Not SA. She had been off maintenance of Valtrex for a few years since not SA.  Had an outbreak of HSV before Thanksgiving.  She also had URI symptoms that could have made this outbreak worse.  Took 4 days of Valtrex and symptoms were control  Since then on / off vagina irritation despite treatment with Diflucan for yeast.  Sees no other lesions from HSV.  she is also very concerned about her ability to conceive if she desires to have sperm donor - wants to have AMH testing done.   Patient's last menstrual period was 11/16/2014.          Sexually active: no  The current method of family planning is OCP (estrogen/progesterone).  Exercising: yes walking one mile daily  Smoker: no   Health Maintenance:  Pap: 08/21/12, negative, colpo and laser in 2002, normal since 2005 TD: 2004 - needs TDaP will get at work [employer]) Gardasil: completed in 2007  Labs:  HB:  14.5   Urine:  Negative     reports that she has quit smoking. She has never used smokeless tobacco. She reports that she drinks about 1.0 oz of alcohol per week. She reports that she does not use illicit drugs.  Past Medical History  Diagnosis Date  . Former smoker   . Obesity   . HSV-1 (herpes simplex virus 1) infection 4/11    positive on peri-anal c & s  . Abnormal Pap smear 2002    Colpo and Laser HPV    Past Surgical History  Procedure Laterality Date  . Cholecystectomy, laparoscopic  2004  . Tonsillectomy and adenoidectomy  2002  . Colposcopy  2002    HPV, laser    Current Outpatient Prescriptions  Medication Sig Dispense Refill  . baclofen (LIORESAL) 10 MG tablet Take 1 tablet by mouth 2 (two) times daily as needed. Limit to 2 times per week  0  . escitalopram (LEXAPRO) 10 MG tablet Take 1 tablet (10 mg total) by mouth daily. 90 tablet 3  . NONFORMULARY OR COMPOUNDED ITEM Boric  acid suppository , size 0 gelatin capsule,1 pv twice weekly.  With flare use daily pv for 3 days. 30 each 12  . norethindrone-ethinyl estradiol (JUNEL FE,GILDESS FE,LOESTRIN FE) 1-20 MG-MCG tablet 1 tablet daily of continuous active pills.  4 packs=3 months 4 Package 3  . rizatriptan (MAXALT) 10 MG tablet Take 10 mg by mouth as needed. May repeat in 2 hours if needed    . topiramate (TOPAMAX) 200 MG tablet Take 200 mg by mouth at bedtime.     . valACYclovir (VALTREX) 500 MG tablet Take 1 tablet (500 mg total) by mouth 2 (two) times daily. 90 tablet 3   No current facility-administered medications for this visit.    Family History  Problem Relation Age of Onset  . Family history unknown: Yes    ROS:  Pertinent items are noted in HPI.  Otherwise, a comprehensive ROS was negative.  Exam:   BP 110/64 mmHg  Pulse 60  Ht 5' 3.25" (1.607 m)  Wt 176 lb (79.833 kg)  BMI 30.91 kg/m2  LMP 11/16/2014 Height: 5' 3.25" (160.7 cm)  Ht Readings from Last 3 Encounters:  11/24/14 5' 3.25" (1.607 m)  10/25/13 5' 3.25" (1.607 m)  09/02/12  (1.6 m)    General appearance: alert,  cooperative and appears stated age Head: Normocephalic, without obvious abnormality, atraumatic Neck: no adenopathy, supple, symmetrical, trachea midline and thyroid normal to inspection and palpation Lungs: clear to auscultation bilaterally Breasts: normal appearance, no masses or tenderness Heart: regular rate and rhythm Abdomen: soft, non-tender; no masses,  no organomegaly Extremities: extremities normal, atraumatic, no cyanosis or edema Skin: Skin color, texture, turgor normal. No rashes or lesions Lymph nodes: Cervical, supraclavicular, and axillary nodes normal. No abnormal inguinal nodes palpated Neurologic: Grossly normal   Pelvic: External genitalia:  no lesions              Urethra:  normal appearing urethra with no masses, tenderness or lesions              Bartholin's and Skene's: normal                  Vagina: normal appearing vagina with normal color and discharge, no lesions              Cervix: anteverted              Pap taken: No. Bimanual Exam:  Uterus:  normal size, contour, position, consistency, mobility, non-tender              Adnexa: no mass, fullness, tenderness               Rectovaginal: Confirms               Anus:  normal sphincter tone, no lesions  A:  Well Woman with normal exam  Contraception for cycle regulation Weight gain - sees [name] [name] History of vaginal odor and irritation on / off with history of BV History of abnormal pap 2002 - normal since  Situational stressors  P:   Reviewed health and wellness pertinent to exam  Pap smear not taken today  Affirm testing today along with HV  Refill on Junel 1/20, Valtrex, and Lexapro for a year  Counseled on breast self exam, use and side effects of OCP's, adequate intake of calcium and vitamin D, diet and exercise return annually or prn  An After Visit Summary was printed and given to the patient.

## 2014-11-24 NOTE — Patient Instructions (Signed)

## 2014-11-25 LAB — WET PREP BY MOLECULAR PROBE
Candida species: NEGATIVE
Gardnerella vaginalis: NEGATIVE
Trichomonas vaginosis: NEGATIVE

## 2014-11-25 LAB — HEMOGLOBIN, FINGERSTICK: Hemoglobin, fingerstick: 14.5 g/dL (ref 12.0–16.0)

## 2014-11-25 LAB — HIV ANTIBODY (ROUTINE TESTING W REFLEX): HIV 1&2 Ab, 4th Generation: NONREACTIVE

## 2014-11-27 NOTE — Progress Notes (Signed)
Encounter reviewed by Dr. Beckham Capistran Silva.  

## 2014-11-29 LAB — ANTI MULLERIAN HORMONE: AMH AssessR: 1.63 ng/mL

## 2014-11-30 ENCOUNTER — Telehealth: Payer: Self-pay | Admitting: Nurse Practitioner

## 2014-11-30 NOTE — Telephone Encounter (Signed)
Left a message to call back about test results.

## 2014-12-01 NOTE — Telephone Encounter (Signed)
Patient calls back to discuss results of AMH.  Discussed with her that I spoke to Dr. Edward JollySilva and we feel her best bet to see if fertility is still viable option for her over the next several years is to see an infertility specialist.  Given this test results and his information may be a better marker than this test alone.  She is agreeable and will consider an consult especially if she is still considering freezing eggs vs. egg donor in the future plans.

## 2015-02-14 ENCOUNTER — Telehealth: Payer: Self-pay | Admitting: Nurse Practitioner

## 2015-02-14 ENCOUNTER — Other Ambulatory Visit: Payer: Self-pay | Admitting: Nurse Practitioner

## 2015-02-14 MED ORDER — ESCITALOPRAM OXALATE 20 MG PO TABS: 20.0000 mg | ORAL_TABLET | Freq: Every day | ORAL | Status: DC

## 2015-02-14 NOTE — Telephone Encounter (Signed)
Routing message to PG to review and advise if okay for increase in dosage.

## 2015-02-14 NOTE — Telephone Encounter (Signed)
I approved for 90 days of med's - she will need a follow up visit in 90 days.  She may get her therapist to see her and send us a note in place of us seeing her.

## 2015-02-14 NOTE — Telephone Encounter (Signed)
Spoke with patient and message from Lauro FranklinPatricia Rolen-Grubb, FNP given. She states she sees therapist weekly and will have therapist send us a note on her progress. Pharmacy changed in system to reflect new preference. Routing to provider for final review. Patient agreeable to disposition. Will close encounter

## 2015-02-14 NOTE — Telephone Encounter (Signed)
Pt's therapist switched the dosage on the Lexapro from 10mg  to 20mg . Would like a rx called in to the [facility]/ [address] at [phone].

## 2015-03-28 ENCOUNTER — Ambulatory Visit (INDEPENDENT_AMBULATORY_CARE_PROVIDER_SITE_OTHER): Payer: BLUE CROSS/BLUE SHIELD | Admitting: Certified Nurse Midwife

## 2015-03-28 ENCOUNTER — Encounter: Payer: Self-pay | Admitting: Certified Nurse Midwife

## 2015-03-28 ENCOUNTER — Telehealth: Payer: Self-pay | Admitting: Certified Nurse Midwife

## 2015-03-28 VITALS — BP 102/68 | HR 68 | Temp 97.5°F | Resp 16 | Ht 63.25 in | Wt 179.0 lb

## 2015-03-28 DIAGNOSIS — N390 Urinary tract infection, site not specified: Secondary | ICD-10-CM | POA: Diagnosis not present

## 2015-03-28 DIAGNOSIS — R319 Hematuria, unspecified: Secondary | ICD-10-CM

## 2015-03-28 DIAGNOSIS — Z113 Encounter for screening for infections with a predominantly sexual mode of transmission: Secondary | ICD-10-CM

## 2015-03-28 LAB — POCT URINALYSIS DIPSTICK
Bilirubin, UA: NEGATIVE
Glucose, UA: NEGATIVE
Ketones, UA: NEGATIVE
Nitrite, UA: NEGATIVE
Protein, UA: NEGATIVE
Urobilinogen, UA: NEGATIVE
pH, UA: 5

## 2015-03-28 MED ORDER — NITROFURANTOIN MONOHYD MACRO 100 MG PO CAPS: 100.0000 mg | ORAL_CAPSULE | Freq: Two times a day (BID) | ORAL | Status: DC

## 2015-03-28 MED ORDER — PHENAZOPYRIDINE HCL 200 MG PO TABS: 200.0000 mg | ORAL_TABLET | Freq: Three times a day (TID) | ORAL | Status: DC | PRN

## 2015-03-28 NOTE — Patient Instructions (Signed)

## 2015-03-28 NOTE — Telephone Encounter (Signed)
Patient calling requesting an appointment today for a "uti?"

## 2015-03-28 NOTE — Progress Notes (Signed)
33 y.o.Single white female g0p0 here with complaint of UTI, with onset  on 3 days ago. Patient complaining of urinary frequency/urgency/ and pain with urination. Patient denies fever, chills, nausea or back pain. No new personal products. Patient feels related to sexual activity. Denies any vaginal symptoms.  Contraception is condoms. Patient taking adequate water intake. Desires Gc,Chlamydia screening also   O: Healthy female WDWN Affect: Normal, orientation x 3 Skin : warm and dry CVAT: Negative bilateral Abdomen: positive for suprapubic tenderness  Pelvic exam: External genital area: normal, no lesions Bladder,Urethra tender, Urethral meatus: tender, red Vagina: normal vaginal discharge, normal appearance   Cervix: normal, non tender Uterus:normal,non tender Adnexa: normal non tender, no fullness or masses   A: UTI Normal pelvic exam STD screening  P: Reviewed findings of UTI and need for treatment. Rx Macrobid see order Rx Pyridium see order ZOX:[id number]:Urine micro, culture Reviewed warning signs and symptoms of UTI and need to advise if occurring. Encouraged to limit soda, tea, and coffee. Increase water intake Discussed condom use for protection for STD. Lab Gc,Chlamydia  Poct urine- wbc 1+, rbc 2+ RV prn

## 2015-03-28 NOTE — Telephone Encounter (Signed)
Spoke with patient. She requests office visit for today for dysuria. Patient at work, requests appointment for after 3 PM. Patient given appointment for 1600 with Verner Choleborah S. Leonard CNM  okay per Billie RuddySally Yeakley, RN.  Routing to provider for final review. Patient agreeable to disposition. Will close encounter.

## 2015-03-29 LAB — URINALYSIS, MICROSCOPIC ONLY
Casts: NONE SEEN
Crystals: NONE SEEN

## 2015-03-30 LAB — URINE CULTURE: Colony Count: 55000

## 2015-03-31 ENCOUNTER — Telehealth: Payer: Self-pay | Admitting: Certified Nurse Midwife

## 2015-03-31 ENCOUNTER — Other Ambulatory Visit: Payer: Self-pay | Admitting: Certified Nurse Midwife

## 2015-03-31 DIAGNOSIS — N390 Urinary tract infection, site not specified: Secondary | ICD-10-CM

## 2015-03-31 LAB — IPS N GONORRHOEA AND CHLAMYDIA BY PCR

## 2015-03-31 MED ORDER — AMOXICILLIN 250 MG PO CAPS: 250.0000 mg | ORAL_CAPSULE | Freq: Three times a day (TID) | ORAL | Status: DC

## 2015-03-31 MED ORDER — FLUCONAZOLE 150 MG PO TABS: 150.0000 mg | ORAL_TABLET | Freq: Once | ORAL | Status: DC

## 2015-03-31 NOTE — Telephone Encounter (Signed)
Ok to do Diflucan  X 1

## 2015-03-31 NOTE — Telephone Encounter (Signed)
Patient calling for her recent results. Her pharmacy called to let her know there is an Rx ready for her and she is not sure why.

## 2015-03-31 NOTE — Telephone Encounter (Signed)
Rx for Diflucan 150 mg #1 0RF sent to pharmacy on file.   Routing to provider for final review. Patient agreeable to disposition. Patient aware provider will review message and nurse will return call with any additional instructions or change of disposition. Will close encounter.

## 2015-03-31 NOTE — Telephone Encounter (Signed)
Spoke with patient. Advised of results as seen below from Verner Choleborah S. Leonard CNM. Patient is agreeable and verbalizes understanding. Patient states "I am really prone to yeast infections. I already take a daily probiotic. Is there any way she could call in diflucan for me to have just in case? I always get a yeast infection when I take antibiotics." Advised would speak with Verner Choleborah S. Leonard CNM and return call with further recommendations. "I am a pharmacist at the pharmacy you have on file so I will see if something gets called in. You don't have to call me." Advised will speak with Verner Choleborah S. Leonard CNM regarding rx. Patient is agreeable.  Notes Recorded by Verner Choleborah S Leonard, CNM on 03/31/2015 at 10:15 AM Notify patient that urine micro is positive for bacteria, urine culture positive for Group B strep. Patient needs medication change to Amoxicillin 250 mg one tid x 3 days. Start on oral probiotic to prevent yeast infection. Order in [facility], Chlamydia pending

## 2015-04-03 ENCOUNTER — Ambulatory Visit (INDEPENDENT_AMBULATORY_CARE_PROVIDER_SITE_OTHER): Payer: BLUE CROSS/BLUE SHIELD | Admitting: Nurse Practitioner

## 2015-04-03 ENCOUNTER — Telehealth: Payer: Self-pay | Admitting: Certified Nurse Midwife

## 2015-04-03 ENCOUNTER — Encounter: Payer: Self-pay | Admitting: Nurse Practitioner

## 2015-04-03 VITALS — BP 104/64 | Temp 98.2°F | Resp 20 | Ht 63.25 in | Wt 180.0 lb

## 2015-04-03 DIAGNOSIS — B373 Candidiasis of vulva and vagina: Secondary | ICD-10-CM

## 2015-04-03 DIAGNOSIS — N390 Urinary tract infection, site not specified: Secondary | ICD-10-CM | POA: Diagnosis not present

## 2015-04-03 DIAGNOSIS — R319 Hematuria, unspecified: Secondary | ICD-10-CM

## 2015-04-03 DIAGNOSIS — B3731 Acute candidiasis of vulva and vagina: Secondary | ICD-10-CM

## 2015-04-03 LAB — POCT URINALYSIS DIPSTICK
Urobilinogen, UA: NEGATIVE
pH, UA: 5

## 2015-04-03 MED ORDER — FLUCONAZOLE 150 MG PO TABS: 150.0000 mg | ORAL_TABLET | Freq: Once | ORAL | Status: DC

## 2015-04-03 NOTE — Telephone Encounter (Signed)
Patient was sen recently 03/28/15 for a uti. Patient says her symptoms have returned after taking medication.

## 2015-04-03 NOTE — Telephone Encounter (Addendum)
Spoke with patient. She states she completed her amoxicillin treatment yesterday and this morning woke up with lower abdominal pressure and hematuria, again. States this is how prior infection presented itself.  No fevers or flank pain.  Patient requests appointment with Lauro FranklinPatricia Rolen-Grubb, FNP her primary provider here at this office and appointment scheduled for today at 3:30 for recurring UTI symptoms.   Routing to provider for final review. Patient agreeable to disposition. Will close encounter.

## 2015-04-03 NOTE — Progress Notes (Signed)
S:  34 y.o.Single white female G0 presents with complaint of UTI. Symptoms began about 5/7.   With symptoms of burning with urination, dysuria. She came in to be seen and was started on Macrobid 5/10.  The urine culture showed + beta strep and antibiotic was changed to Amoxil for 3 days.  As soon as she completed antibiotics she had a 'raging' urethral discomfort and a yeast infection.  She was given Diflucan X 1 with some help.Pertinent negatives include The patient is having no constitutional symptoms, denying fever, chills, anorexia, or weight loss. Sexually active: yes.  Symptoms related to post coital Yes.   Current method of birth control is condoms.  Same partner for just a month. Last  UTI documented a week ago.  Her lst visit the [facility] and Chlamydia also is negative.  ROS: no weight loss, fever, night sweats and feels well  O alert, oriented to person, place, and time   healthy,  alert,  not in acute distress, well developed and well nourished  Abdomen: mild  No CVA tenderness  cervix normal in appearance, external genitalia normal, no adnexal masses or tenderness, no cervical motion tenderness and pink tinged thick vaginak discharge.   Diagnostic Test:    Urinalysis:  2+ RBC, 1+ WBC's   urine culture, micro  Affirm is done  Assessment: R/O UTI   R/O yeast  Plan:  Maintain adequate hydration. Follow up if symptoms not improving, and as needed.   Medication Therapy: she has Macrobid on hand and will start that back until culture is returned   FGH:WEXH follow with Affirm testing  RV

## 2015-04-03 NOTE — Patient Instructions (Signed)

## 2015-04-03 NOTE — Progress Notes (Signed)
Reviewed personally.  M. Suzanne Nikaya Nasby, MD.  

## 2015-04-04 LAB — URINALYSIS, MICROSCOPIC ONLY
Bacteria, UA: NONE SEEN
Casts: NONE SEEN
Crystals: NONE SEEN

## 2015-04-04 LAB — WET PREP BY MOLECULAR PROBE
Candida species: NEGATIVE
Gardnerella vaginalis: NEGATIVE
Trichomonas vaginosis: NEGATIVE

## 2015-04-05 LAB — URINE CULTURE
Colony Count: NO GROWTH
Organism ID, Bacteria: NO GROWTH

## 2015-04-06 ENCOUNTER — Telehealth: Payer: Self-pay | Admitting: Nurse Practitioner

## 2015-04-06 NOTE — Telephone Encounter (Signed)
Patient calling for results.

## 2015-04-06 NOTE — Telephone Encounter (Signed)
Spoke with patient. Advised of results as seen below. Patient is agreeable. Denies any current symptoms. Will complete the two Diflucan. Will monitor symptoms and if they return will return call to office to be seen for further evaluation.  Notes Recorded by Verner Choleborah S Leonard, CNM on 04/05/2015 at 5:28 PM Notify patient that Urine culture is negative Notes Recorded by Ria CommentPatricia Grubb, FNP on 04/04/2015 at 8:04 AM Results via my chart:  Delice Bisonara, the Affirm test is negative, however I do want you to complete 2 of the Diflucan for yeast now as we discussed. No other bacterial infection like trichomonas. Urine culture is pending.  Routing to provider for final review. Patient agreeable to disposition. Will close encounter.

## 2015-04-09 NOTE — Progress Notes (Signed)
Encounter reviewed by Dr. Conley SimmondsBrook Silva. G/CT both negative on 03/29/15.

## 2015-04-14 ENCOUNTER — Telehealth: Payer: Self-pay | Admitting: *Deleted

## 2015-04-14 ENCOUNTER — Encounter: Payer: Self-pay | Admitting: Nurse Practitioner

## 2015-04-14 ENCOUNTER — Other Ambulatory Visit: Payer: Self-pay | Admitting: Nurse Practitioner

## 2015-04-14 MED ORDER — NITROFURANTOIN MONOHYD MACRO 100 MG PO CAPS
ORAL_CAPSULE | ORAL | Status: DC
Start: 2015-04-14 — End: 2015-04-14

## 2015-04-14 MED ORDER — NITROFURANTOIN MONOHYD MACRO 100 MG PO CAPS: ORAL_CAPSULE | ORAL | Status: DC

## 2015-04-14 NOTE — Telephone Encounter (Signed)
See My Chart encounter. Was seen in office on 5-10 and 04-03-15 for UTI.  Patient reports history of frequent UTI's in past. No evidence of this noted in Epic but patient states this was 6-7 years ago. Reports she has a new partner. States she discussed this with Patty at last OV and STD testing was negative.  She took one Macrobid this am and has one left, Complains of urgency, burning at end of urine flow and slow urine stream. Denies back pain, fever, pelvic pain or vaginal discharge. Advised this is not usually treated over phone and OV recommended. Will review with provider but may not be available. Recommend urgent care over weekend if worsens before can be seen.

## 2015-04-14 NOTE — Telephone Encounter (Signed)
I will refill Macrobid for her and will follow if symptoms persist.

## 2015-04-14 NOTE — Telephone Encounter (Signed)
Macrobid refill sent to pharmacy last in computer.  Call to patient, left message that prescription was refilled and will need to call back if symptoms persist or if any questions.  Encounter closed.

## 2015-05-17 ENCOUNTER — Other Ambulatory Visit: Payer: Self-pay | Admitting: Nurse Practitioner

## 2015-05-17 NOTE — Telephone Encounter (Signed)
Medication refill request: Lexapro  Last AEX:  11-24-14  Next AEX: not scheduled yet Last MMG (if hormonal medication request): N/A Refill authorized: please advise

## 2015-07-10 ENCOUNTER — Encounter: Payer: Self-pay | Admitting: Nurse Practitioner

## 2015-07-10 ENCOUNTER — Telehealth: Payer: Self-pay | Admitting: Nurse Practitioner

## 2015-07-10 ENCOUNTER — Ambulatory Visit (INDEPENDENT_AMBULATORY_CARE_PROVIDER_SITE_OTHER): Payer: BLUE CROSS/BLUE SHIELD | Admitting: Nurse Practitioner

## 2015-07-10 VITALS — BP 110/72 | HR 64 | Ht 63.25 in | Wt 189.0 lb

## 2015-07-10 DIAGNOSIS — N926 Irregular menstruation, unspecified: Secondary | ICD-10-CM

## 2015-07-10 LAB — HCG, QUANTITATIVE, PREGNANCY: hCG, Beta Chain, Quant, S: <2 m[IU]/mL

## 2015-07-10 LAB — POCT URINE PREGNANCY: Preg Test, Ur: NEGATIVE

## 2015-07-10 NOTE — Telephone Encounter (Signed)
Spoke with patient. Patient states that she had "very light spotting" for 6 days then had light bleeding for 2 days on 8/11-8/12. Patient is currently having cramping like she is going to start her cycle, but has not had any further bleeding. LMP was 7/14. Not currently on any form of birth control. Took UPT which was negative. Patient is concerned as she states "My cycles are usually very regular." Advised will need to be seen in office for further evaluation with Lauro Franklin, FNP. Patient is agreeable and verbalizes understanding. Appointment scheduled for today 8/22 at 12:45pm with Lauro Franklin, FNP. Patient is agreeable to date and time.  Routing to provider for final review. Patient agreeable to disposition. Will close encounter.   Patient aware provider will review message and nurse will return call if any additional advice or change of disposition.

## 2015-07-10 NOTE — Telephone Encounter (Signed)
Patient has a question for Patty Grubb's nurse. No details given.

## 2015-07-10 NOTE — Progress Notes (Signed)
Subjective:     Patient ID: [name] [name], female   DOB: 1981/09/24, 34 y.o.   MRN: [id number]  HPI this 34 yo SW female presents with a history of normal menses on 06/01/15.  She then had spotting on 8/6 - 8/11 with a spell of BRB on 8/11 and 8/12.  She has associated breast tenderness, nausea, and lower pelvic cramping like a menses was going to start.  No concerns about STD's.   She has been off OCP since January 2016.  She had not been SA.  She then started dating someone for 3 months and was using condoms.  They broke up by mutual consent but, she was with him again on July 21 and August 1st with using withdrawal only for birth control.  She has taken several OTC UPT and they were all negative.     Review of Systems  Constitutional: Negative for fever, diaphoresis and fatigue.  Respiratory: Negative.   Cardiovascular: Negative.   Gastrointestinal: Positive for nausea.       Slight nausea at times  Genitourinary: Positive for vaginal bleeding and pelvic pain. Negative for vaginal discharge and vaginal pain.       Cramps that are mild.  abnormal bleeding as noted.  Musculoskeletal: Negative.   Skin: Negative.   Neurological: Negative.   Psychiatric/Behavioral: Negative.        Objective:   Physical Exam  Constitutional: She is oriented to person, place, and time. She appears well-developed and well-nourished. No distress.  Cardiovascular: Normal heart sounds.   Pulmonary/Chest: Effort normal.  Abdominal: Soft. She exhibits no distension and no mass. There is no tenderness. There is no rebound and no guarding.  Genitourinary:  Cervical os is closed.  No problems with adnexal pain. Not able to illicit same pain as she felt.  No vaginal discharge.  No vaginal bleeding  Neurological: She is alert and oriented to person, place, and time.  Psychiatric: She has a normal mood and affect. Her behavior is normal. Judgment and thought content normal.   UPT is negative    Assessment:     Irregular menses Recent sexual activity off OCP    Plan:     Serum HCG - stat and follow If she continues to have pelvic pain or abdominal pain to call and will recheck STD"s       She is called with STAT HCG results and it was normal

## 2015-07-11 ENCOUNTER — Encounter: Payer: Self-pay | Admitting: Nurse Practitioner

## 2015-07-13 NOTE — Progress Notes (Signed)
Encounter reviewed by Dr. Brook Amundson C. Silva.  

## 2015-10-20 ENCOUNTER — Telehealth: Payer: Self-pay | Admitting: Nurse Practitioner

## 2015-10-20 NOTE — Telephone Encounter (Signed)
Patient called and requested some name of OB doctors. She said, "I am pregnant and already verified it with a blood test so I don't need to come and see Patty. I just need some names of practices that do OB care." Gave patient three names off of our referral list for OB's. Patient appreciative. FYI only.

## 2015-10-26 NOTE — Telephone Encounter (Signed)
Patient is still waiting on call with Patty Grubb's OB recommendation.

## 2015-10-26 NOTE — Telephone Encounter (Signed)
Sharma CovertStarla Curl provided the patient with three locations off our referrals list. Patient would like name of specific provider to see for OB from Ria CommentPatricia Grubb, FNP. Routing to PepsiCoDeborah Leonard CNM as Ria CommentPatricia Grubb, FNP is out of the office today.

## 2015-11-06 ENCOUNTER — Inpatient Hospital Stay (HOSPITAL_COMMUNITY)
Admission: AD | Admit: 2015-11-06 | Discharge: 2015-11-06 | Disposition: A | Discharge status: Home / Self Care | Payer: BLUE CROSS/BLUE SHIELD | Source: Ambulatory Visit | Attending: Obstetrics and Gynecology | Admitting: Obstetrics and Gynecology

## 2015-11-06 ENCOUNTER — Encounter: Payer: Self-pay | Admitting: Nurse Practitioner

## 2015-11-06 ENCOUNTER — Encounter (HOSPITAL_COMMUNITY): Payer: Self-pay | Admitting: *Deleted

## 2015-11-06 DIAGNOSIS — O30001 Twin pregnancy, unspecified number of placenta and unspecified number of amniotic sacs, first trimester: Secondary | ICD-10-CM | POA: Insufficient documentation

## 2015-11-06 DIAGNOSIS — Z87891 Personal history of nicotine dependence: Secondary | ICD-10-CM | POA: Insufficient documentation

## 2015-11-06 DIAGNOSIS — N9489 Other specified conditions associated with female genital organs and menstrual cycle: Secondary | ICD-10-CM

## 2015-11-06 DIAGNOSIS — O23591 Infection of other part of genital tract in pregnancy, first trimester: Secondary | ICD-10-CM | POA: Diagnosis not present

## 2015-11-06 DIAGNOSIS — A499 Bacterial infection, unspecified: Secondary | ICD-10-CM | POA: Diagnosis not present

## 2015-11-06 DIAGNOSIS — Z3A01 Less than 8 weeks gestation of pregnancy: Secondary | ICD-10-CM | POA: Diagnosis not present

## 2015-11-06 DIAGNOSIS — N760 Acute vaginitis: Secondary | ICD-10-CM | POA: Diagnosis not present

## 2015-11-06 DIAGNOSIS — N898 Other specified noninflammatory disorders of vagina: Secondary | ICD-10-CM | POA: Diagnosis present

## 2015-11-06 DIAGNOSIS — B9689 Other specified bacterial agents as the cause of diseases classified elsewhere: Secondary | ICD-10-CM

## 2015-11-06 HISTORY — DX: Unspecified abnormal cytological findings in specimens from vagina: R87.629

## 2015-11-06 HISTORY — DX: Headache: R51

## 2015-11-06 HISTORY — DX: Headache, unspecified: R51.9

## 2015-11-06 LAB — WET PREP, GENITAL
Sperm: NONE SEEN
Trich, Wet Prep: NONE SEEN
Yeast Wet Prep HPF POC: NONE SEEN

## 2015-11-06 LAB — URINE MICROSCOPIC-ADD ON

## 2015-11-06 LAB — URINALYSIS, ROUTINE W REFLEX MICROSCOPIC
Bilirubin Urine: NEGATIVE
Glucose, UA: NEGATIVE mg/dL
Ketones, ur: NEGATIVE mg/dL
Nitrite: NEGATIVE
Protein, ur: NEGATIVE mg/dL
Specific Gravity, Urine: >1.03 — ABNORMAL HIGH (ref 1.005–1.030)
pH: 5.5 (ref 5.0–8.0)

## 2015-11-06 LAB — POCT PREGNANCY, URINE: Preg Test, Ur: POSITIVE — AB

## 2015-11-06 MED ORDER — METRONIDAZOLE 500 MG PO TABS: 500.0000 mg | ORAL_TABLET | Freq: Two times a day (BID) | ORAL | Status: AC

## 2015-11-06 NOTE — MAU Note (Signed)
Twin gestation 

## 2015-11-06 NOTE — MAU Note (Signed)
Patient presents with lower abdominal cramping and yellow vaginal discharge fishy odor.

## 2015-11-06 NOTE — Discharge Instructions (Signed)

## 2015-11-06 NOTE — MAU Provider Note (Signed)
Chief Complaint: Abdominal Cramping and Vaginal Discharge   First Provider Initiated Contact with Patient 11/06/15 1852     SUBJECTIVE HPI  [name] [name] is a 34 y.o. G1P0 at 1314w4d by LMP who presents to maternity admissions reporting intermittent pelvic cramping with new onset yellow vaginal discharge today, with slight odor. She denies vaginal bleeding, vaginal itching/burning, urinary symptoms, h/a, dizziness, n/v, or fever/chills.    This is a twin gestation conceived through injectables and IUI.  US in the office confirmed two viable fetuses.  Plans to go to [facility] but not until January. 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 No bleeding.    Past Medical History  Diagnosis Date  . Former smoker   . Obesity   . HSV-1 (herpes simplex virus 1) infection 4/11    positive on peri-anal c & s  . Abnormal Pap smear 2002    Colpo and Laser HPV  . Vaginal Pap smear, abnormal   . Headache     migraines   Past Surgical History  Procedure Laterality Date  . Cholecystectomy, laparoscopic  2004  . Tonsillectomy and adenoidectomy  2002  . Colposcopy  2002    HPV, laser   Social History   Social History  . Marital Status: Single    Spouse Name: N/A  . Number of Children: 0  . Years of Education: N/A   Occupational History  .     Social History Main Topics  . Smoking status: Former Games developermoker  . Smokeless tobacco: Never Used  . Alcohol Use: 1.2 oz/week    2 Standard drinks or equivalent per week  . Drug Use: No  . Sexual Activity:    Partners: Male    Birth Control/ Protection: Condom   Other Topics Concern  . Not on file   Social History Narrative   No current facility-administered medications on file prior to encounter.   Current Outpatient Prescriptions on File Prior to Encounter  Medication Sig Dispense Refill  . baclofen (LIORESAL) 10 MG tablet Take 1 tablet by mouth 2 (two) times daily as needed. Limit to 2 times per week  0  . escitalopram  (LEXAPRO) 20 MG tablet take 1 tablet by mouth once daily 90 tablet 0  . fluconazole (DIFLUCAN) 150 MG tablet Take 1 tablet (150 mg total) by mouth once. Take one tablet.  Repeat in 48 hours if symptoms are not completely resolved. 2 tablet 1  . NONFORMULARY OR COMPOUNDED ITEM Boric acid suppository 200mg , size 0 gelatin capsule,1 pv twice weekly.  With flare use daily pv for 3 days. 30 each 12  . topiramate (TOPAMAX) 200 MG tablet Take 200 mg by mouth at bedtime.     . valACYclovir (VALTREX) 500 MG tablet Take 1 tablet (500 mg total) by mouth 2 (two) times daily. 90 tablet 3  . zolmitriptan (ZOMIG) 5 MG nasal solution Place into the nose as needed for migraine.     No Known Allergies  ROS:  Review of Systems   Constitutional: Negative for fever and chills.  Gastrointestinal: Negative for nausea, vomiting, diarrhea and constipation.  Positive for cramping Genitourinary: Negative for dysuria.  Musculoskeletal: Negative for back pain.  Neurological: Negative for dizziness and weakness.   I have reviewed patient's Past Medical Hx, Surgical Hx, Family Hx, Social Hx, medications and allergies.  Physical Exam   Physical Exam  Patient Vitals for the past 24 hrs:  BP Temp Temp src Pulse Resp Height Weight  11/06/15 1730 124/71  mmHg 97.9 F (36.6 C) Oral 66 18  (1.575 m) 89.721 kg (197 lb 12.8 oz)   Constitutional: Well-developed, well-nourished female in no acute distress.  Cardiovascular: normal rate Respiratory: normal effort GI: Abd soft, non-tender. Pos BS x 4 MS: Extremities nontender, no edema, normal ROM Neurologic: Alert and oriented x 4.  GU: Neg CVAT.  PELVIC EXAM: Cervix pink, visually closed, without lesion, scant yellow creamy discharge, vaginal walls and external genitalia normal Bimanual exam: Cervix 0/long/high, firm, anterior, neg CMT, uterus nontender, nonenlarged, adnexa without tenderness, enlargement, or mass  FHT visible on bedside US, two embryos seen   LAB  RESULTS Results for orders placed or performed during the hospital encounter of 11/06/15 (from the past 24 hour(s))  Urinalysis, Routine w reflex microscopic (not at [facility])     Status: Abnormal   Collection Time: 11/06/15  5:30 PM  Result Value Ref Range   Color, Urine YELLOW YELLOW   APPearance HAZY (A) CLEAR   Specific Gravity, Urine >1.030 (H) 1.005 - 1.030   pH 5.5 5.0 - 8.0   Glucose, UA NEGATIVE NEGATIVE mg/dL   Hgb urine dipstick SMALL (A) NEGATIVE   Bilirubin Urine NEGATIVE NEGATIVE   Ketones, ur NEGATIVE NEGATIVE mg/dL   Protein, ur NEGATIVE NEGATIVE mg/dL   Nitrite NEGATIVE NEGATIVE   Leukocytes, UA SMALL (A) NEGATIVE  Urine microscopic-add on     Status: Abnormal   Collection Time: 11/06/15  5:30 PM  Result Value Ref Range   Squamous Epithelial / LPF 6-30 (A) NONE SEEN   WBC, UA 0-5 0 - 5 WBC/hpf   RBC / HPF 0-5 0 - 5 RBC/hpf   Bacteria, UA RARE (A) NONE SEEN  Pregnancy, urine POC     Status: Abnormal   Collection Time: 11/06/15  6:44 PM  Result Value Ref Range   Preg Test, Ur POSITIVE (A) NEGATIVE  Wet prep, genital     Status: Abnormal   Collection Time: 11/06/15  7:26 PM  Result Value Ref Range   Yeast Wet Prep HPF POC NONE SEEN NONE SEEN   Trich, Wet Prep NONE SEEN NONE SEEN   Clue Cells Wet Prep HPF POC PRESENT (A) NONE SEEN   WBC, Wet Prep HPF POC MODERATE (A) NONE SEEN   Sperm NONE SEEN     IMAGING No results found.  MAU Management/MDM: Ordered labs and reviewed results.   Pt stable at time of discharge.   ASSESSMENT Twin IUP at [redacted]w[redacted]d  Uterine cramping with closed cervix, probably due to rapid growth of uterus Bacterial Vaginosis  PLAN Discharge home Rx Flagyl x 7 days for BV Discussed probiotics for prevention Follow up at [facility] for prenatal care    Medication List    ASK your doctor about these medications        baclofen 10 MG tablet  Commonly known as:  LIORESAL  Take 1 tablet by mouth 2 (two) times daily as needed. Limit to 2 times  per week     escitalopram 20 MG tablet  Commonly known as:  LEXAPRO  take 1 tablet by mouth once daily     fluconazole 150 MG tablet  Commonly known as:  DIFLUCAN  Take 1 tablet (150 mg total) by mouth once. Take one tablet.  Repeat in 48 hours if symptoms are not completely resolved.     NONFORMULARY OR COMPOUNDED ITEM  Boric acid suppository , size 0 gelatin capsule,1 pv twice weekly.  With flare use daily pv for 3 days.  topiramate 200 MG tablet  Commonly known as:  TOPAMAX  Take 200 mg by mouth at bedtime.     valACYclovir 500 MG tablet  Commonly known as:  VALTREX  Take 1 tablet (500 mg total) by mouth 2 (two) times daily.     zolmitriptan 5 MG nasal solution  Commonly known as:  ZOMIG  Place into the nose as needed for migraine.         Wynelle Bourgeois CNM, MSN Certified Nurse-Midwife 11/06/2015  7:50 PM

## 2015-11-06 NOTE — MAU Note (Signed)
Single mom. Went to fertility specialist to become pregnant.

## 2015-11-07 ENCOUNTER — Telehealth: Payer: Self-pay | Admitting: Emergency Medicine

## 2015-11-07 NOTE — Telephone Encounter (Signed)
Message left to return call to [place] at [phone] for follow up.

## 2015-11-07 NOTE — Telephone Encounter (Signed)
Patient seen in Maternal Admissions Unit at [facility]  11/06/15. Telephone call to patient and Message left to return call to [place] at [phone] for follow up.

## 2015-11-07 NOTE — Telephone Encounter (Signed)
Chief Complaint  Patient presents with  . Advice Only    Patient sent mychart message    Patient sent mychart message with request for appointment on 11/06/15 when office was closed.    I tried to call the office but the phones are off and I can not leave a message. I am pregnant and I do not see my OB until jan 2017. Today I started to have a yellow discharge and was wondering if I could come in for an appt with patty. If possible to call me back today Monday due to the fact I work 12 hour days and would need to find coverage for a sick visit appointment.. thanks you   Patient seen in Maternal Admissions Unit at [facility] on 11/06/15: Plan per Notes:  ASSESSMENT Twin IUP at 5256w4d  Uterine cramping with closed cervix, probably due to rapid growth of uterus Bacterial Vaginosis  PLAN Discharge home Rx Flagyl x 7 days for BV Discussed probiotics for prevention Follow up at [facility] for prenatal care

## 2015-11-07 NOTE — Telephone Encounter (Signed)
Okay for provider to close encounter or is further follow up necessary? °

## 2015-11-22 LAB — OB RESULTS CONSOLE ABO/RH: RH Type: POSITIVE

## 2015-11-22 LAB — OB RESULTS CONSOLE HIV ANTIBODY (ROUTINE TESTING): HIV: NONREACTIVE

## 2015-11-22 LAB — OB RESULTS CONSOLE HEPATITIS B SURFACE ANTIGEN: Hepatitis B Surface Ag: NEGATIVE

## 2015-11-22 LAB — OB RESULTS CONSOLE RPR: RPR: NONREACTIVE

## 2015-11-22 LAB — OB RESULTS CONSOLE GC/CHLAMYDIA
Chlamydia: NEGATIVE
Gonorrhea: NEGATIVE

## 2015-11-22 LAB — OB RESULTS CONSOLE GBS: GBS: POSITIVE

## 2015-11-22 LAB — OB RESULTS CONSOLE ANTIBODY SCREEN: Antibody Screen: NEGATIVE

## 2015-11-22 LAB — OB RESULTS CONSOLE RUBELLA ANTIBODY, IGM: Rubella: NON-IMMUNE/NOT IMMUNE

## 2015-12-01 NOTE — Telephone Encounter (Signed)
Alexia Freestoneatty, is this message okay to close?  Patient was seen in Maternal Admissions Unit at [facility] on 11/07/15.

## 2015-12-01 NOTE — Telephone Encounter (Signed)
Called patient back and unable to leave a MSG both at home and work number - will close the encounter.

## 2016-03-07 ENCOUNTER — Inpatient Hospital Stay (HOSPITAL_COMMUNITY)
Admission: AD | Admit: 2016-03-07 | Discharge: 2016-03-07 | Disposition: A | Discharge status: Home / Self Care | Payer: 59 | Source: Ambulatory Visit | Attending: Obstetrics and Gynecology | Admitting: Obstetrics and Gynecology

## 2016-03-07 ENCOUNTER — Encounter (HOSPITAL_COMMUNITY): Payer: Self-pay | Admitting: *Deleted

## 2016-03-07 DIAGNOSIS — O1202 Gestational edema, second trimester: Secondary | ICD-10-CM

## 2016-03-07 DIAGNOSIS — Z3A24 24 weeks gestation of pregnancy: Secondary | ICD-10-CM | POA: Insufficient documentation

## 2016-03-07 DIAGNOSIS — Z8669 Personal history of other diseases of the nervous system and sense organs: Secondary | ICD-10-CM

## 2016-03-07 HISTORY — DX: Personal history of other diseases of the nervous system and sense organs: Z86.69

## 2016-03-07 NOTE — Discharge Instructions (Signed)
Second Trimester of Pregnancy The second trimester is from week 13 through week 28, months 4 through 6. The second trimester is often a time when you feel your best. Your body has also adjusted to being pregnant, and you begin to feel better physically. Usually, morning sickness has lessened or quit completely, you may have more energy, and you may have an increase in appetite. The second trimester is also a time when the fetus is growing rapidly. At the end of the sixth month, the fetus is about 9 inches long and weighs about 1 pounds. You will likely begin to feel the baby move (quickening) between 18 and 20 weeks of the pregnancy. BODY CHANGES Your body goes through many changes during pregnancy. The changes vary from woman to woman.   Your weight will continue to increase. You will notice your lower abdomen bulging out.  You may begin to get stretch marks on your hips, abdomen, and breasts.  You may develop headaches that can be relieved by medicines approved by your health care provider.  You may urinate more often because the fetus is pressing on your bladder.  You may develop or continue to have heartburn as a result of your pregnancy.  You may develop constipation because certain hormones are causing the muscles that push waste through your intestines to slow down.  You may develop hemorrhoids or swollen, bulging veins (varicose veins).  You may have back pain because of the weight gain and pregnancy hormones relaxing your joints between the bones in your pelvis and as a result of a shift in weight and the muscles that support your balance.  Your breasts will continue to grow and be tender.  Your gums may bleed and may be sensitive to brushing and flossing.  Dark spots or blotches (chloasma, mask of pregnancy) may develop on your face. This will likely fade after the baby is born.  A dark line from your belly button to the pubic area (linea nigra) may appear. This will likely fade  after the baby is born.  You may have changes in your hair. These can include thickening of your hair, rapid growth, and changes in texture. Some women also have hair loss during or after pregnancy, or hair that feels dry or thin. Your hair will most likely return to normal after your baby is born. WHAT TO EXPECT AT YOUR PRENATAL VISITS During a routine prenatal visit:  You will be weighed to make sure you and the fetus are growing normally.  Your blood pressure will be taken.  Your abdomen will be measured to track your baby's growth.  The fetal heartbeat will be listened to.  Any test results from the previous visit will be discussed. Your health care provider may ask you:  How you are feeling.  If you are feeling the baby move.  If you have had any abnormal symptoms, such as leaking fluid, bleeding, severe headaches, or abdominal cramping.  If you are using any tobacco products, including cigarettes, chewing tobacco, and electronic cigarettes.  If you have any questions. Other tests that may be performed during your second trimester include:  Blood tests that check for:  Low iron levels (anemia).  Gestational diabetes (between 24 and 28 weeks).  Rh antibodies.  Urine tests to check for infections, diabetes, or protein in the urine.  An ultrasound to confirm the proper growth and development of the baby.  An amniocentesis to check for possible genetic problems.  Fetal screens for spina bifida  and Down syndrome.  HIV (human immunodeficiency virus) testing. Routine prenatal testing includes screening for HIV, unless you choose not to have this test. HOME CARE INSTRUCTIONS   Avoid all smoking, herbs, alcohol, and unprescribed drugs. These chemicals affect the formation and growth of the baby.  Do not use any tobacco products, including cigarettes, chewing tobacco, and electronic cigarettes. If you need help quitting, ask your health care provider. You may receive  counseling support and other resources to help you quit.  Follow your health care provider's instructions regarding medicine use. There are medicines that are either safe or unsafe to take during pregnancy.  Exercise only as directed by your health care provider. Experiencing uterine cramps is a good sign to stop exercising.  Continue to eat regular, healthy meals.  Wear a good support bra for breast tenderness.  Do not use hot tubs, steam rooms, or saunas.  Wear your seat belt at all times when driving.  Avoid raw meat, uncooked cheese, cat litter boxes, and soil used by cats. These carry germs that can cause birth defects in the baby.  Take your prenatal vitamins.  Take 1500-2000 mg of calcium daily starting at the 20th week of pregnancy until you deliver your baby.  Try taking a stool softener (if your health care provider approves) if you develop constipation. Eat more high-fiber foods, such as fresh vegetables or fruit and whole grains. Drink plenty of fluids to keep your urine clear or pale yellow.  Take warm sitz baths to soothe any pain or discomfort caused by hemorrhoids. Use hemorrhoid cream if your health care provider approves.  If you develop varicose veins, wear support hose. Elevate your feet for 15 minutes, 3-4 times a day. Limit salt in your diet.  Avoid heavy lifting, wear low heel shoes, and practice good posture.  Rest with your legs elevated if you have leg cramps or low back pain.  Visit your dentist if you have not gone yet during your pregnancy. Use a soft toothbrush to brush your teeth and be gentle when you floss.  A sexual relationship may be continued unless your health care provider directs you otherwise.  Continue to go to all your prenatal visits as directed by your health care provider. SEEK MEDICAL CARE IF:   You have dizziness.  You have mild pelvic cramps, pelvic pressure, or nagging pain in the abdominal area.  You have persistent nausea,  vomiting, or diarrhea.  You have a bad smelling vaginal discharge.  You have pain with urination. SEEK IMMEDIATE MEDICAL CARE IF:   You have a fever.  You are leaking fluid from your vagina.  You have spotting or bleeding from your vagina.  You have severe abdominal cramping or pain.  You have rapid weight gain or loss.  You have shortness of breath with chest pain.  You notice sudden or extreme swelling of your face, hands, ankles, feet, or legs.  You have not felt your baby move in over an hour.  You have severe headaches that do not go away with medicine.  You have vision changes.   This information is not intended to replace advice given to you by your health care provider. Make sure you discuss any questions you have with your health care provider.   Document Released: 10/29/2001 Document Revised: 11/25/2014 Document Reviewed: 01/05/2013 Elsevier Interactive Patient Education 2016 Elsevier Inc. Edema Edema is an abnormal buildup of fluids in your bodytissues. Edema is somewhatdependent on gravity to pull the fluid to the  lowest place in your body. That makes the condition more common in the legs and thighs (lower extremities). Painless swelling of the feet and ankles is common and becomes more likely as you get older. It is also common in looser tissues, like around your eyes.  When the affected area is squeezed, the fluid may move out of that spot and leave a dent for a few moments. This dent is called pitting.  CAUSES  There are many possible causes of edema. Eating too much salt and being on your feet or sitting for a long time can cause edema in your legs and ankles. Hot weather may make edema worse. Common medical causes of edema include:  Heart failure.  Liver disease.  Kidney disease.  Weak blood vessels in your legs.  Cancer.  An injury.  Pregnancy.  Some medications.  Obesity. SYMPTOMS  Edema is usually painless.Your skin may look swollen or  shiny.  DIAGNOSIS  Your health care provider may be able to diagnose edema by asking about your medical history and doing a physical exam. You may need to have tests such as X-rays, an electrocardiogram, or blood tests to check for medical conditions that may cause edema.  TREATMENT  Edema treatment depends on the cause. If you have heart, liver, or kidney disease, you need the treatment appropriate for these conditions. General treatment may include:  Elevation of the affected body part above the level of your heart.  Compression of the affected body part. Pressure from elastic bandages or support stockings squeezes the tissues and forces fluid back into the blood vessels. This keeps fluid from entering the tissues.  Restriction of fluid and salt intake.  Use of a water pill (diuretic). These medications are appropriate only for some types of edema. They pull fluid out of your body and make you urinate more often. This gets rid of fluid and reduces swelling, but diuretics can have side effects. Only use diuretics as directed by your health care provider. HOME CARE INSTRUCTIONS   Keep the affected body part above the level of your heart when you are lying down.   Do not sit still or stand for prolonged periods.   Do not put anything directly under your knees when lying down.  Do not wear constricting clothing or garters on your upper legs.   Exercise your legs to work the fluid back into your blood vessels. This may help the swelling go down.   Wear elastic bandages or support stockings to reduce ankle swelling as directed by your health care provider.   Eat a low-salt diet to reduce fluid if your health care provider recommends it.   Only take medicines as directed by your health care provider. SEEK MEDICAL CARE IF:   Your edema is not responding to treatment.  You have heart, liver, or kidney disease and notice symptoms of edema.  You have edema in your legs that does not  improve after elevating them.   You have sudden and unexplained weight gain. SEEK IMMEDIATE MEDICAL CARE IF:   You develop shortness of breath or chest pain.   You cannot breathe when you lie down.  You develop pain, redness, or warmth in the swollen areas.   You have heart, liver, or kidney disease and suddenly get edema.  You have a fever and your symptoms suddenly get worse. MAKE SURE YOU:   Understand these instructions.  Will watch your condition.  Will get help right away if you are not doing  well or get worse.   This information is not intended to replace advice given to you by your health care provider. Make sure you discuss any questions you have with your health care provider.   Document Released: 11/04/2005 Document Revised: 11/25/2014 Document Reviewed: 08/27/2013 Elsevier Interactive Patient Education [employer]2016 Elsevier Inc.

## 2016-03-07 NOTE — [facility] Provider Note (Signed)
[name] [name], [name] [name] is a 35 yo, G1P0 at 24.6 weeks presents to [facility] with complaints of bilateral lower extremities non pitting edema with left ankle more edematous than other leg.   Report edema for past two weeks.  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  Wears compression hose during the day and elevates her legs at the end of the day and swelling typically goes away. This morning her swelling was persistent from last night.  Denies redness, soreness or pain when walking. Also reports a headache yesterday that resolved with fioricet. No headache today.  Denies abd pain, bleeding or LOF. BP has been normal.  [place] earlier today and was informed by Midwife on call that the symptoms she described are common discomforts of pregnancy. Pt opted to come to [facility] anyway because [place] a [employer] fast stream[employer]aby shower tomorrow and wanted to "make sure everything was all right".    History     Patient Active Problem List   Diagnosis Date Noted  . Edema during pregnancy in second trimester 03/07/2016  . Situational stress 11/24/2014  . HSV (herpes simplex virus) anogenital infection 11/24/2014    Chief Complaint  Patient presents with  . Leg Swelling   HPI  OB History    Gravida Para Term Preterm AB TAB SAB Ectopic Multiple Living   1 0              Past Medical History  Diagnosis Date  . Former smoker   . Obesity   . HSV-1 (herpes simplex virus 1) infection 4/11    positive on peri-anal c & s  . Abnormal Pap smear 2002    Colpo and Laser HPV  . Vaginal Pap smear, abnormal   . Headache     migraines    Past Surgical History  Procedure Laterality Date  . Cholecystectomy, laparoscopic  2004  . Tonsillectomy and adenoidectomy  2002  . Colposcopy  2002    HPV, laser    Family History  Problem Relation Age of Onset  . Lung cancer Paternal Grandfather     Social History  Substance Use Topics  . Smoking status: Former Games developermoker  .  Smokeless tobacco: Never Used  . Alcohol Use: 1.2 oz/week    2 Standard drinks or equivalent per week    Allergies:  Allergies  Allergen Reactions  . Other     steroid eye drops-causes glaucoma     Prescriptions prior to admission  Medication Sig Dispense Refill Last Dose  . acetaminophen (TYLENOL) 500 MG tablet Take 1,000 mg by mouth every 6 (six) hours as needed for moderate pain.   Past Week at Unknown time  . Butalbital-APAP-Caffeine 50-300-40 MG CAPS Take 1 tablet by mouth every 4 (four) hours as needed (headache).   0 03/06/2016 at Unknown time  . calcium carbonate (TUMS - DOSED IN MG ELEMENTAL CALCIUM) 500 MG chewable tablet Chew 2 tablets by mouth daily as needed for indigestion or heartburn.   03/06/2016 at Unknown time  . diphenhydrAMINE (BENADRYL) 25 MG tablet Take 25 mg by mouth every 6 (six) hours as needed for allergies (pain).   Past Week at Unknown time  . folic acid (FOLVITE) 400 MCG tablet Take 400 mcg by mouth daily.   03/07/2016 at Unknown time  . Prenatal Vit-Fe Fumarate-FA (PRENATAL MULTIVITAMIN) TABS tablet Take 1 tablet by mouth daily at 12 noon.   03/07/2016 at Unknown time  . escitalopram (  LEXAPRO) 20 MG tablet take 1 tablet by mouth once daily (Patient not taking: Reported on 03/07/2016) 90 tablet 0 Taking  . NONFORMULARY OR COMPOUNDED ITEM Boric acid suppository , size 0 gelatin capsule,1 pv twice weekly.  With flare use daily pv for 3 days. (Patient not taking: Reported on 03/07/2016) 30 each 12 Taking  . valACYclovir (VALTREX) 500 MG tablet Take 1 tablet (500 mg total) by mouth 2 (two) times daily. (Patient not taking: Reported on 03/07/2016) 90 tablet 3 Taking    ROS Physical Exam   Blood pressure 111/68, pulse 78, temperature 97.9 F (36.6 C), temperature source Oral, resp. rate 16, last menstrual period 06/01/2015.  No results found for this or any previous visit (from the past 24 hour(s)).   Physical Exam  Constitutional: She is oriented to person,  place, and time. She appears well-developed and well-nourished.  HENT:  Head: Normocephalic.  Eyes: Pupils are equal, round, and reactive to light.  Neck: Normal range of motion. Neck supple.  Cardiovascular: Normal rate and regular rhythm.   Respiratory: Effort normal and breath sounds normal.  GI: Soft.  Musculoskeletal: Normal range of motion. She exhibits edema.  Bilateral non pitting ankle and pedal edema.  No redness, tenderness, Negative Homan sign   Neurological: She is alert and oriented to person, place, and time. She has normal reflexes.  Skin: Skin is warm and dry.  Psychiatric: She has a normal mood and affect. Her behavior is normal. Judgment and thought content normal.    ED Course  Assessment: iuP 24.6 wks Bilateral ankle/pedal non pitting edema   Plan: DC home  Discussed common discomforts of pregnancy - HA & edema,  Continue to take tylenol, Fioricet for headaches  When traveling via air: - Wear compression hose -frequent walking/stretching recommended  Call PRN if symptoms worsen Seek medical attention/ if pain, redness and warmth with increased swelling noted in extremities  Keep scheduled return OB appointment with [facility]   Alphonzo Severance CNM, MSN 03/07/2016 5:17 PM

## 2016-03-07 NOTE — [facility] Note (Signed)
Urine sent to lab 

## 2016-03-07 NOTE — [facility]U Note (Signed)
Pt C/O edema in legs & feet for the last 2 weeks, has been wearing compression socks.  Is usually better in the mornings, but not this morning, now L ankle is swollen double the size of the R ankle.  HA yesterday, not today.  Denies abd pain, bleeding or LOF.  BP has been normal.

## 2016-04-29 ENCOUNTER — Inpatient Hospital Stay (HOSPITAL_COMMUNITY)
Admission: AD | Admit: 2016-04-29 | Discharge: 2016-04-29 | Disposition: A | Discharge status: Home / Self Care | Payer: 59 | Source: Ambulatory Visit | Attending: Obstetrics and Gynecology | Admitting: Obstetrics and Gynecology

## 2016-04-29 ENCOUNTER — Encounter (HOSPITAL_COMMUNITY): Payer: Self-pay

## 2016-04-29 DIAGNOSIS — B3731 Acute candidiasis of vulva and vagina: Secondary | ICD-10-CM

## 2016-04-29 DIAGNOSIS — B373 Candidiasis of vulva and vagina: Secondary | ICD-10-CM | POA: Insufficient documentation

## 2016-04-29 DIAGNOSIS — Z22330 Carrier of Group B streptococcus: Secondary | ICD-10-CM

## 2016-04-29 DIAGNOSIS — E860 Dehydration: Secondary | ICD-10-CM | POA: Diagnosis not present

## 2016-04-29 DIAGNOSIS — R1030 Lower abdominal pain, unspecified: Secondary | ICD-10-CM | POA: Diagnosis present

## 2016-04-29 DIAGNOSIS — Z87891 Personal history of nicotine dependence: Secondary | ICD-10-CM | POA: Diagnosis not present

## 2016-04-29 DIAGNOSIS — Z3A32 32 weeks gestation of pregnancy: Secondary | ICD-10-CM | POA: Diagnosis not present

## 2016-04-29 DIAGNOSIS — O98813 Other maternal infectious and parasitic diseases complicating pregnancy, third trimester: Secondary | ICD-10-CM | POA: Diagnosis not present

## 2016-04-29 DIAGNOSIS — O4703 False labor before 37 completed weeks of gestation, third trimester: Secondary | ICD-10-CM

## 2016-04-29 LAB — URINALYSIS, ROUTINE W REFLEX MICROSCOPIC
Bilirubin Urine: NEGATIVE
Glucose, UA: NEGATIVE mg/dL
Ketones, ur: 15 mg/dL — AB
Nitrite: NEGATIVE
Protein, ur: NEGATIVE mg/dL
Specific Gravity, Urine: <1.005 — ABNORMAL LOW (ref 1.005–1.030)
pH: 5.5 (ref 5.0–8.0)

## 2016-04-29 LAB — URINE MICROSCOPIC-ADD ON

## 2016-04-29 LAB — FETAL FIBRONECTIN: Fetal Fibronectin: NEGATIVE

## 2016-04-29 LAB — WET PREP, GENITAL
Clue Cells Wet Prep HPF POC: NONE SEEN
Sperm: NONE SEEN
Trich, Wet Prep: NONE SEEN

## 2016-04-29 MED ORDER — LACTATED RINGERS IV BOLUS (SEPSIS)
1000.0000 mL | Freq: Once | INTRAVENOUS | Status: AC
  Administered 2016-04-29: 1000 mL via INTRAVENOUS

## 2016-04-29 MED ORDER — NIFEDIPINE 10 MG PO CAPS: 10.0000 mg | ORAL_CAPSULE | Freq: Four times a day (QID) | ORAL | Status: DC | PRN

## 2016-04-29 MED ORDER — TERCONAZOLE 0.4 % VA CREA: 1.0000 | TOPICAL_CREAM | Freq: Every day | VAGINAL | Status: DC

## 2016-04-29 MED ORDER — NIFEDIPINE 10 MG PO CAPS
10.0000 mg | ORAL_CAPSULE | Freq: Once | ORAL | Status: AC
  Administered 2016-04-29: 10 mg via ORAL
  Filled 2016-04-29: qty 1

## 2016-04-29 MED ORDER — NIFEDIPINE 10 MG PO CAPS
10.0000 mg | ORAL_CAPSULE | Freq: Three times a day (TID) | ORAL | Status: DC
  Administered 2016-04-29: 10 mg via ORAL
  Filled 2016-04-29: qty 1

## 2016-04-29 NOTE — Discharge Instructions (Signed)
Monilial Vaginitis Vaginitis in a soreness, swelling and redness (inflammation) of the vagina and vulva. Monilial vaginitis is not a sexually transmitted infection. CAUSES  Yeast vaginitis is caused by yeast (candida) that is normally found in your vagina. With a yeast infection, the candida has overgrown in number to a point that upsets the chemical balance. SYMPTOMS   White, thick vaginal discharge.  Swelling, itching, redness and irritation of the vagina and possibly the lips of the vagina (vulva).  Burning or painful urination.  Painful intercourse. DIAGNOSIS  Things that may contribute to monilial vaginitis are:  Postmenopausal and virginal states.  Pregnancy.  Infections.  Being tired, sick or stressed, especially if you had monilial vaginitis in the past.  Diabetes. Good control will help lower the chance.  Birth control pills.  Tight fitting garments.  Using bubble bath, feminine sprays, douches or deodorant tampons.  Taking certain medications that kill germs (antibiotics).  Sporadic recurrence can occur if you become ill. TREATMENT  Your caregiver will give you medication.  There are several kinds of anti monilial vaginal creams and suppositories specific for monilial vaginitis. For recurrent yeast infections, use a suppository or cream in the vagina 2 times a week, or as directed.  Anti-monilial or steroid cream for the itching or irritation of the vulva may also be used. Get your caregiver's permission.  Painting the vagina with methylene blue solution may help if the monilial cream does not work.  Eating yogurt may help prevent monilial vaginitis. HOME CARE INSTRUCTIONS   Finish all medication as prescribed.  Do not have sex until treatment is completed or after your caregiver tells you it is okay.  Take warm sitz baths.  Do not douche.  Do not use tampons, especially scented ones.  Wear cotton underwear.  Avoid tight pants and panty  hose.  Tell your sexual partner that you have a yeast infection. They should go to their caregiver if they have symptoms such as mild rash or itching.  Your sexual partner should be treated as well if your infection is difficult to eliminate.  Practice safer sex. Use condoms.  Some vaginal medications cause latex condoms to fail. Vaginal medications that harm condoms are:  Cleocin cream.  Butoconazole (Femstat).  Terconazole (Terazol) vaginal suppository.  Miconazole (Monistat) (may be purchased over the counter). SEEK MEDICAL CARE IF:   You have a temperature by mouth above 102 F (38.9 C).  The infection is getting worse after 2 days of treatment.  The infection is not getting better after 3 days of treatment.  You develop blisters in or around your vagina.  You develop vaginal bleeding, and it is not your menstrual period.  You have pain when you urinate.  You develop intestinal problems.  You have pain with sexual intercourse.   This information is not intended to replace advice given to you by your health care provider. Make sure you discuss any questions you have with your health care provider.   Document Released: 08/14/2005 Document Revised: 01/27/2012 Document Reviewed: 05/08/2015 Elsevier Interactive Patient Education 2016 Elsevier Inc.       [employer] of the uterus can occur throughout pregnancy. Contractions are not always a sign that you are in labor.  WHAT ARE BRAXTON HICKS CONTRACTIONS?  Contractions that occur before labor are called Braxton Hicks contractions, or false labor. Toward the end of pregnancy (32-34 weeks), these contractions can develop more often and may become more forceful. This is not true labor  because these contractions do not result in opening (dilatation) and thinning of the cervix. They are sometimes difficult to tell apart from true labor because these contractions can be forceful and people have  different pain tolerances. You should not feel embarrassed if you go to the hospital with false labor. Sometimes, the only way to tell if you are in true labor is for your health care provider to look for changes in the cervix. If there are no prenatal problems or other health problems associated with the pregnancy, it is completely safe to be sent home with false labor and await the onset of true labor. HOW CAN YOU TELL THE DIFFERENCE BETWEEN TRUE AND FALSE LABOR? False Labor  The contractions of false labor are usually shorter and not as hard as those of true labor.   The contractions are usually irregular.   The contractions are often felt in the front of the lower abdomen and in the groin.   The contractions may go away when you walk around or change positions while lying down.   The contractions get weaker and are shorter lasting as time goes on.   The contractions do not usually become progressively stronger, regular, and closer together as with true labor.  True Labor  Contractions in true labor last 30-70 seconds, become very regular, usually become more intense, and increase in frequency.   The contractions do not go away with walking.   The discomfort is usually felt in the top of the uterus and spreads to the lower abdomen and low back.   True labor can be determined by your health care provider with an exam. This will show that the cervix is dilating and getting thinner.  WHAT TO REMEMBER  Keep up with your usual exercises and follow other instructions given by your health care provider.   Take medicines as directed by your health care provider.   Keep your regular prenatal appointments.   Eat and drink lightly if you think you are going into labor.   If Braxton Hicks contractions are making you uncomfortable:   Change your position from lying down or resting to walking, or from walking to resting.   Sit and rest in a tub of warm water.   Drink  2-3 glasses of water. Dehydration may cause these contractions.   Do slow and deep breathing several times an hour.  WHEN SHOULD I SEEK IMMEDIATE MEDICAL CARE? Seek immediate medical care if:  Your contractions become stronger, more regular, and closer together.   You have fluid leaking or gushing from your vagina.   You have a fever.   You pass blood-tinged mucus.   You have vaginal bleeding.   You have continuous abdominal pain.   You have low back pain that you never had before.   You feel your baby's head pushing down and causing pelvic pressure.   Your baby is not moving as much as it used to.    This information is not intended to replace advice given to you by your health care provider. Make sure you discuss any questions you have with your health care provider.   Document Released: 11/04/2005 Document Revised: 11/09/2013 Document Reviewed: 08/16/2013 Elsevier Interactive Patient Education 2016 Elsevier Inc. Dehydration, Adult Dehydration is a condition in which you do not have enough fluid or water in your body. It happens when you take in less fluid than you lose. Vital organs such as the kidneys, brain, and heart cannot function without a proper amount  of fluids. Any loss of fluids from the body can cause dehydration.  Dehydration can range from mild to severe. This condition should be treated right away to help prevent it from becoming severe. CAUSES  This condition may be caused by:  Vomiting.  Diarrhea.  Excessive sweating, such as when exercising in hot or humid weather.  Not drinking enough fluid during strenuous exercise or during an illness.  Excessive urine output.  Fever.  Certain medicines. RISK FACTORS This condition is more likely to develop in:  People who are taking certain medicines that cause the body to lose excess fluid (diuretics).   People who have a chronic illness, such as diabetes, that may increase urination.  Older  adults.   People who live at high altitudes.   People who participate in endurance sports.  SYMPTOMS  Mild Dehydration  Thirst.  Dry lips.  Slightly dry mouth.  Dry, warm skin. Moderate Dehydration  Very dry mouth.   Muscle cramps.   Dark urine and decreased urine production.   Decreased tear production.   Headache.   Light-headedness, especially when you stand up from a sitting position.  Severe Dehydration  Changes in skin.   Cold and clammy skin.   Skin does not spring back quickly when lightly pinched and released.   Changes in body fluids.   Extreme thirst.   No tears.   Not able to sweat when body temperature is high, such as in hot weather.   Minimal urine production.   Changes in vital signs.   Rapid, weak pulse (more than 100 beats per minute when you are sitting still).   Rapid breathing.   Low blood pressure.   Other changes.   Sunken eyes.   Cold hands and feet.   Confusion.  Lethargy and difficulty being awakened.  Fainting (syncope).   Short-term weight loss.   Unconsciousness. DIAGNOSIS  This condition may be diagnosed based on your symptoms. You may also have tests to determine how severe your dehydration is. These tests may include:   Urine tests.   Blood tests.  TREATMENT  Treatment for this condition depends on the severity. Mild or moderate dehydration can often be treated at home. Treatment should be started right away. Do not wait until dehydration becomes severe. Severe dehydration needs to be treated at the hospital. Treatment for Mild Dehydration  Drinking plenty of water to replace the fluid you have lost.   Replacing minerals in your blood (electrolytes) that you may have lost.  Treatment for Moderate Dehydration  Consuming oral rehydration solution (ORS). Treatment for Severe Dehydration  Receiving fluid through an IV tube.   Receiving electrolyte solution through a  feeding tube that is passed through your nose and into your stomach (nasogastric tube or NG tube).  Correcting any abnormalities in electrolytes. HOME CARE INSTRUCTIONS   Drink enough fluid to keep your urine clear or pale yellow.   Drink water or fluid slowly by taking small sips. You can also try sucking on ice cubes.  Have food or beverages that contain electrolytes. Examples include bananas and sports drinks.  Take over-the-counter and prescription medicines only as told by your health care provider.   Prepare ORS according to the manufacturer's instructions. Take sips of ORS every 5 minutes until your urine returns to normal.  If you have vomiting or diarrhea, continue to try to drink water, ORS, or both.   If you have diarrhea, avoid:   Beverages that contain caffeine.   Fruit juice.  Milk.   Carbonated soft drinks.  Do not take salt tablets. This can lead to the condition of having too much sodium in your body (hypernatremia).  SEEK MEDICAL CARE IF:  You cannot eat or drink without vomiting.  You have had moderate diarrhea during a period of more than 24 hours.  You have a fever. SEEK IMMEDIATE MEDICAL CARE IF:   You have extreme thirst.  You have severe diarrhea.  You have not urinated in 6-8 hours, or you have urinated only a small amount of very dark urine.  You have shriveled skin.  You are dizzy, confused, or both.   This information is not intended to replace advice given to you by your health care provider. Make sure you discuss any questions you have with your health care provider.   Document Released: 11/04/2005 Document Revised: 07/26/2015 Document Reviewed: 03/22/2015 Elsevier Interactive Patient Education [employer]

## 2016-04-29 NOTE — MAU Provider Note (Signed)
[name] [name] is a 35yo, G1P0, 32.4 wks presenting to [facility] announced for lower abdominal and back pain, pressure that started earlier today. Denies bleeding or leaking.   Endorse good FM. Feels more pressure when standing and walking, pain is not relieved with tylenol.    History     Patient Active Problem List   Diagnosis Date Noted  . Yeast infection of the vagina 04/30/2016  . Braxton Hicks contractions 04/30/2016  . Edema during pregnancy in second trimester 03/07/2016  . Hx of migraines 03/07/2016  . Situational stress 11/24/2014  . HSV (herpes simplex virus) anogenital infection 11/24/2014    Chief Complaint  Patient presents with  . Abdominal Pain  . Nausea   HPI  OB History    Gravida Para Term Preterm AB TAB SAB Ectopic Multiple Living   1 0              Past Medical History  Diagnosis Date  . Former smoker   . Obesity   . HSV-1 (herpes simplex virus 1) infection 4/11    positive on peri-anal c & s  . Abnormal Pap smear 2002    Colpo and Laser HPV  . Vaginal Pap smear, abnormal   . Headache     migraines    Past Surgical History  Procedure Laterality Date  . Cholecystectomy, laparoscopic  2004  . Tonsillectomy and adenoidectomy  2002  . Colposcopy  2002    HPV, laser    Family History  Problem Relation Age of Onset  . Lung cancer Paternal Grandfather     Social History  Substance Use Topics  . Smoking status: Former Games developer  . Smokeless tobacco: Never Used  . Alcohol Use: 1.2 oz/week    2 Standard drinks or equivalent per week    Allergies:  Allergies  Allergen Reactions  . Other Other (See Comments)    steroid eye drops-causes glaucoma     No prescriptions prior to admission    ROS Physical Exam   Blood pressure 117/64, pulse 85, temperature 98.3 F (36.8 C), temperature source Oral, resp. rate 18, last menstrual period 06/01/2015.    Results for orders placed or performed during the hospital encounter of 04/29/16 (from  the past 24 hour(s))  Urinalysis, Routine w reflex microscopic (not at [facility])     Status: Abnormal   Collection Time: 04/29/16  7:02 PM  Result Value Ref Range   Color, Urine YELLOW YELLOW   APPearance CLEAR CLEAR   Specific Gravity, Urine <1.005 (L) 1.005 - 1.030   pH 5.5 5.0 - 8.0   Glucose, UA NEGATIVE NEGATIVE mg/dL   Hgb urine dipstick SMALL (A) NEGATIVE   Bilirubin Urine NEGATIVE NEGATIVE   Ketones, ur 15 (A) NEGATIVE mg/dL   Protein, ur NEGATIVE NEGATIVE mg/dL   Nitrite NEGATIVE NEGATIVE   Leukocytes, UA LARGE (A) NEGATIVE  Urine microscopic-add on     Status: Abnormal   Collection Time: 04/29/16  7:02 PM  Result Value Ref Range   Squamous Epithelial / LPF 6-30 (A) NONE SEEN   WBC, UA TOO NUMEROUS TO COUNT 0 - 5 WBC/hpf   RBC / HPF 0-5 0 - 5 RBC/hpf   Bacteria, UA FEW (A) NONE SEEN  Fetal fibronectin     Status: None   Collection Time: 04/29/16  8:22 PM  Result Value Ref Range   Fetal Fibronectin NEGATIVE NEGATIVE  Wet prep, genital     Status: Abnormal   Collection Time: 04/29/16  8:22 PM  Result Value Ref Range   Yeast Wet Prep HPF POC PRESENT (A) NONE SEEN   Trich, Wet Prep NONE SEEN NONE SEEN   Clue Cells Wet Prep HPF POC NONE SEEN NONE SEEN   WBC, Wet Prep HPF POC MANY (A) NONE SEEN   Sperm NONE SEEN    FHT:  Cat 1 tracing, 130-140 bpm, moderate variability, +accels, no decels UC:  Irregular, 2-4 min, unable to palpate Dilation: Fingertip Effacement (%): 50 Station: Ballotable Exam by:: Alphonzo Severanceachel Miri Jose CNM  Physical Exam  ED Course  Assessment: IUP 32.4  Preterm contractions Vaginal Yeast infection Dehydration, +15 ketones FFN negative SVE:  Fingertip/50/ballotable unchanged Cat 1 FT GBS carrier  Plan: Procardia 10 mg , now (x2) IV hydration, LR DC home in stable condition Rx procardia 10mg  q6hrs, prn RX Terazol 7 Keep scheduled ROB  Call PRN Letter for out of work 05/02/16   Alphonzo Severanceachel Ayisha Pol CNM, MSN 04/30/2016 2:20 AM

## 2016-04-29 NOTE — MAU Note (Signed)
Pt presents complaining of lower abdominal and lower back cramping today. Denies bleeding or leaking. Reports good fetal movement. Having groin pain with walking. Some nausea this am. Tried 2 tylenol at 1515 with no relief.

## 2016-04-30 DIAGNOSIS — Z22330 Carrier of Group B streptococcus: Secondary | ICD-10-CM

## 2016-04-30 LAB — GC/CHLAMYDIA PROBE AMP (~~LOC~~) NOT AT ARMC
Chlamydia: NEGATIVE
Neisseria Gonorrhea: NEGATIVE

## 2016-06-19 ENCOUNTER — Encounter (HOSPITAL_COMMUNITY): Payer: Self-pay

## 2016-06-19 ENCOUNTER — Inpatient Hospital Stay (HOSPITAL_COMMUNITY)
Admission: AD | Admit: 2016-06-19 | Discharge: 2016-06-23 | DRG: 765 | Disposition: A | Discharge status: Home / Self Care | Payer: 59 | Source: Ambulatory Visit | Attending: Obstetrics and Gynecology | Admitting: Obstetrics and Gynecology

## 2016-06-19 DIAGNOSIS — O4202 Full-term premature rupture of membranes, onset of labor within 24 hours of rupture: Principal | ICD-10-CM | POA: Diagnosis present

## 2016-06-19 DIAGNOSIS — Z87891 Personal history of nicotine dependence: Secondary | ICD-10-CM

## 2016-06-19 DIAGNOSIS — O99824 Streptococcus B carrier state complicating childbirth: Secondary | ICD-10-CM | POA: Diagnosis present

## 2016-06-19 DIAGNOSIS — Z98891 History of uterine scar from previous surgery: Secondary | ICD-10-CM

## 2016-06-19 DIAGNOSIS — O99214 Obesity complicating childbirth: Secondary | ICD-10-CM | POA: Diagnosis present

## 2016-06-19 DIAGNOSIS — Z6841 Body Mass Index (BMI) 40.0 and over, adult: Secondary | ICD-10-CM | POA: Diagnosis not present

## 2016-06-19 DIAGNOSIS — Z3A39 39 weeks gestation of pregnancy: Secondary | ICD-10-CM

## 2016-06-19 LAB — POCT FERN TEST: POCT Fern Test: POSITIVE

## 2016-06-19 LAB — CBC
HCT: 36.4 % (ref 36.0–46.0)
Hemoglobin: 12.1 g/dL (ref 12.0–15.0)
MCH: 29.8 pg (ref 26.0–34.0)
MCHC: 33.2 g/dL (ref 30.0–36.0)
MCV: 89.7 fL (ref 78.0–100.0)
Platelets: 268 10*3/uL (ref 150–400)
RBC: 4.06 MIL/uL (ref 3.87–5.11)
RDW: 15.4 % (ref 11.5–15.5)
WBC: 11.4 10*3/uL — ABNORMAL HIGH (ref 4.0–10.5)

## 2016-06-19 LAB — TYPE AND SCREEN
ABO/RH(D): A POS
Antibody Screen: NEGATIVE

## 2016-06-19 MED ORDER — OXYCODONE-ACETAMINOPHEN 5-325 MG PO TABS: 1.0000 | ORAL_TABLET | ORAL | Status: DC | PRN

## 2016-06-19 MED ORDER — PENICILLIN G POTASSIUM 5000000 UNITS IJ SOLR
5.0000 10*6.[IU] | Freq: Once | INTRAVENOUS | Status: AC
  Administered 2016-06-19: 5 10*6.[IU] via INTRAVENOUS
  Filled 2016-06-19: qty 5

## 2016-06-19 MED ORDER — OXYTOCIN 40 UNITS IN LACTATED RINGERS INFUSION - SIMPLE MED
2.5000 [IU]/h | INTRAVENOUS | Status: DC
  Filled 2016-06-19: qty 1000

## 2016-06-19 MED ORDER — ONDANSETRON HCL 4 MG/2ML IJ SOLN: 4.0000 mg | Freq: Four times a day (QID) | INTRAMUSCULAR | Status: DC | PRN

## 2016-06-19 MED ORDER — ACETAMINOPHEN 325 MG PO TABS: 650.0000 mg | ORAL_TABLET | ORAL | Status: DC | PRN

## 2016-06-19 MED ORDER — LACTATED RINGERS IV SOLN: 500.0000 mL | INTRAVENOUS | Status: DC | PRN

## 2016-06-19 MED ORDER — ZOLPIDEM TARTRATE 5 MG PO TABS: 5.0000 mg | ORAL_TABLET | Freq: Every evening | ORAL | Status: DC | PRN

## 2016-06-19 MED ORDER — LIDOCAINE HCL (PF) 1 % IJ SOLN: 30.0000 mL | INTRAMUSCULAR | Status: DC | PRN

## 2016-06-19 MED ORDER — FLEET ENEMA 7-19 GM/118ML RE ENEM: 1.0000 | ENEMA | RECTAL | Status: DC | PRN

## 2016-06-19 MED ORDER — FENTANYL CITRATE (PF) 100 MCG/2ML IJ SOLN: 100.0000 ug | INTRAMUSCULAR | Status: DC | PRN

## 2016-06-19 MED ORDER — TERBUTALINE SULFATE 1 MG/ML IJ SOLN
0.2500 mg | Freq: Once | INTRAMUSCULAR | Status: DC | PRN
Start: 2016-06-19 — End: 2016-06-21

## 2016-06-19 MED ORDER — OXYTOCIN 40 UNITS IN LACTATED RINGERS INFUSION - SIMPLE MED
1.0000 m[IU]/min | INTRAVENOUS | Status: DC
  Administered 2016-06-19: 1 m[IU]/min via INTRAVENOUS

## 2016-06-19 MED ORDER — SOD CITRATE-CITRIC ACID 500-334 MG/5ML PO SOLN
30.0000 mL | ORAL | Status: DC | PRN
  Administered 2016-06-21: 30 mL via ORAL
  Filled 2016-06-19: qty 15

## 2016-06-19 MED ORDER — OXYCODONE-ACETAMINOPHEN 5-325 MG PO TABS: 2.0000 | ORAL_TABLET | ORAL | Status: DC | PRN

## 2016-06-19 MED ORDER — OXYTOCIN BOLUS FROM INFUSION: 500.0000 mL | Freq: Once | INTRAVENOUS | Status: DC

## 2016-06-19 MED ORDER — DEXTROSE 5 % IV SOLN
2.5000 10*6.[IU] | INTRAVENOUS | Status: DC
  Administered 2016-06-20 – 2016-06-21 (×7): 2.5 10*6.[IU] via INTRAVENOUS
  Filled 2016-06-19 (×10): qty 2.5

## 2016-06-19 MED ORDER — LACTATED RINGERS IV SOLN
INTRAVENOUS | Status: DC
  Administered 2016-06-20 – 2016-06-21 (×4): via INTRAVENOUS

## 2016-06-19 NOTE — MAU Note (Signed)
ROM and contractions 

## 2016-06-19 NOTE — H&P (Signed)
[name] [name] is a 35 y.o. female, G1P0 at 64 5/7 weeks, presenting for SROM around 5pm, leaking clear fluid, minimal contractions, +FM.  Denies HSV lesions or prodrome.  Patient Active Problem List   Diagnosis Date Noted  . GBS carrier 04/30/2016  . Edema during pregnancy in second trimester 03/07/2016  . Hx of migraines 03/07/2016  . Situational stress 11/24/2014  . HSV (herpes simplex virus) anogenital infection 11/24/2014    History of present pregnancy: Patient entered care at 9 2/7 weeks.   EDC of  06/21/16 was established by LMP.   Anatomy scan:  19 5/7 weeks, with limited anatomy and a posterior low-lying  placenta.   Additional [place] evaluations:   23 6/7 weeks:  Completion of anatomy, growth at 50%ile, normal placental position. 38 5/7 weeks:  Vtx, AFI 19.2, 80%ile. Significant prenatal events:  Initial twin pregnancy by 6 week [place], conceived by IUI, with fetal demise of Twin B at 8 weeks, single viable gestation by 12 week [place].  GBS positive on NOB urine, treated, still positive on TOC.  Elevated 1 hour GTT, single abnormal value on 3 hour GTT.  Pedal edema noted at 24 6/7 weeks.  MAU visit at 32 weeks for abd/back pain, UCs noted, given Procardia in MAU. Last evaluation:  06/17/16--3 cm, 70%, vtx, -3, BP 120/70, weight 235.  OB History    Gravida Para Term Preterm AB Living   1 0           SAB TAB Ectopic Multiple Live Births                 Past Medical History:  Diagnosis Date  . Abnormal Pap smear 2002   Colpo and Laser HPV  . Former smoker   . Headache    migraines  . HSV-1 (herpes simplex virus 1) infection 4/11   positive on peri-anal c & s  . Obesity   . Vaginal Pap smear, abnormal    Past Surgical History:  Procedure Laterality Date  . CHOLECYSTECTOMY, LAPAROSCOPIC  2004  . COLPOSCOPY  2002   HPV, laser  . TONSILLECTOMY AND ADENOIDECTOMY  2002   Family History: family history includes Lung cancer in her paternal grandfather. MGM cancer; MGF pulmonary  embolism.  Social History:  reports that she has quit smoking. She has never used smokeless tobacco. She reports that she drinks about 1.2 oz of alcohol per week . She reports that she does not use drugs.   She is Caucasian, post-grad educated, employed as a Teacher, early years/pre, single.   Prenatal Transfer Tool  Maternal Diabetes: No Genetic Screening: Normal 1st trimester screen and AFP Maternal Ultrasounds/Referrals: Normal Fetal Ultrasounds or other Referrals:  None Maternal Substance Abuse:  No Significant Maternal Medications:  None Significant Maternal Lab Results: Lab values include: Group B Strep positive  TDAP 2017 Flu 2017  ROS:  Leaking clear fluid, +FM  Allergies  Allergen Reactions  . Other Other (See Comments)    steroid eye drops-causes glaucoma      Dilation: 3 Effacement (%): 60 Station: -3 Exam by:: Ginnie Smart RN Blood pressure 129/78, pulse 97, temperature 98.3 F (36.8 C), temperature source Oral, resp. rate 16, height  (1.575 m), weight 105.2 kg (232 lb), last menstrual period 06/01/2015.  Chest clear Heart RRR without murmur Abd gravid, NT, FH 37 cm Pelvic: As above Ext: WNL.  FHR: Category 1 UCs:  Occasional  Prenatal labs: ABO, Rh: A/Positive/-- (01/04 0000)A+ Antibody: Negative (01/04 0000)Neg Rubella:  Immune  RPR: Nonreactive (01/04 0000) NR HBsAg: Negative (01/04 0000) NR HIV: Non-reactive (01/04 0000) NR GBS: Positive (01/04 0000)Positive Sickle cell/Hgb electrophoresis:  NA Pap:  10/2015 WNL GC:  Negative 04/29/16 Chlamydia:  Negative 04/29/16 Genetic screenings:  Normal 1st trimester screen and AFP Glucola:  Elevated 1 hour GTT, then single abnormal value on 3 hour GTT Other:   Hgb 13.3 at NOB, 11.2 at 28 weeks    Assessment/Plan: IUP at 39 5/7 weeks SROM at 5p, latent labor AMA Hx HSV 1, no recent/current lesions.  Plan: Admit to Birthing Suite per consult with Dr. Richardson Dopp Routine [facility] orders Pain med/epidural prn PCN G  for GBS prophylaxis  Discussed options for observation or augmentation with pitocin.  Patient agreeable with augmentation. Will recheck cervix after initial ATB dose infused, then start pitocin.  Nyra Capes, [place] 06/19/2016, 8:43 PM

## 2016-06-19 NOTE — Anesthesia Pain Management Evaluation Note (Signed)
  [facility] Pain Management Visit Note  Patient: [name] [name], 35 y.o., female  "Hello I am a member of the anesthesia team at [facility]. We have an anesthesia team available at all times to provide care throughout the hospital, including epidural management and anesthesia for C-section. I don't know your plan for the delivery whether it a natural birth, water birth, IV sedation, nitrous supplementation, doula or epidural, but we want to meet your pain goals."   1.Was your pain managed to your expectations on prior hospitalizations?   No prior hospitalizations  2.What is your expectation for pain management during this hospitalization?     Labor support without medications, Epidural, IV pain meds and Nitrous Oxide  3.How can we help you reach that goal? Pt is open to discussion about all pain control methods  Record the patient's initial score and the patient's pain goal.   Pain: 2  Pain Goal: 8 [facility] wants you to be able to say your pain was always managed very well.  Gerrell Tabet 06/19/2016

## 2016-06-19 NOTE — Progress Notes (Addendum)
  Subjective: Aware of some mild cramping, but not uncomfortable.  Objective: BP 108/65   Pulse 85   Temp 97.8 F (36.6 C) (Oral)   Resp 18   Ht 5\' 2"  (1.575 m)   Wt 105.2 kg (232 lb)   LMP 06/01/2015 (Exact Date)   BMI 42.43 kg/m  No intake/output data recorded. No intake/output data recorded.  FHT: Category 1 UC:   Occasional SVE:   2-3 cm, 70%, vtx, -3, posterior, tight outlet, EFW 8 lbs.  No HSV lesions noted on external/internal exam, no prodrome. Leaking clear fluid.  Assessment:  PROM at term, no labor GBS positive Hx HSV--no lesions or prodrome.  Plan: Discussed recommendation for pitocin augmentation--patient agreeable with plan. Pain med prn.  Nigel Bridgeman CNM 06/19/2016, 11:19 PM

## 2016-06-20 LAB — HIV ANTIBODY (ROUTINE TESTING W REFLEX): HIV Screen 4th Generation wRfx: NONREACTIVE

## 2016-06-20 LAB — RPR: RPR Ser Ql: NONREACTIVE

## 2016-06-20 LAB — ABO/RH: ABO/RH(D): A POS

## 2016-06-20 NOTE — Progress Notes (Addendum)
Shakilah Linnebur is a 35 y.o. G1P0 at [redacted]w[redacted]d admitted for  rupture of membranes  Subjective: Patient comfortable with contractions.   Objective: BP 126/77   Pulse 86   Temp 97.6 F (36.4 C) (Oral)   Resp 16   Ht 5\' 2"  (1.575 m)   Wt 105.2 kg (232 lb)   LMP 06/01/2015 (Exact Date)   BMI 42.43 kg/m  No intake/output data recorded. No intake/output data recorded.  FHT:  FHR: 140 bpm, variability: moderate,  accelerations:  Present,  decelerations:  Absent UC:   regular, every 2- 4 minutes SVE:   Dilation: 2.5 Effacement (%): 70 Station: -3 Exam by:: V Latham   IUPC placed in.  Cvx 3/50%/-3.    Labs: Lab Results  Component Value Date   WBC 11.4 (H) 06/19/2016   HGB 12.1 06/19/2016   HCT 36.4 06/19/2016   MCV 89.7 06/19/2016   PLT 268 06/19/2016    Assessment / Plan: Augmentation of labor after SROM on 06/19/16 at 1700  Labor: C/w pitocin titration.  Has been on pitocin since 11 pm last night.  Fetal Wellbeing:  Category I Pain Control:  Labor support without medications and may request and receive pain meds prn Anticipated MOD:  NSVD  Trinten Boudoin WAKURU 06/20/2016, 11:38 AM

## 2016-06-20 NOTE — Progress Notes (Signed)
Assuming care of [name] [name], 35 yo G1P0 @ 39.6 wks admitted for ROM since 5 pm on 06/19/16. Friend at bedside and put off by my request for an action plan as we approach 30 hrs of SROM.  Subjective: No pain. Used Nitrous earlier in the day for increased pain, but none lately. Leaking tiny amounts of clear amniotic fluid. Reports very little rest. +FM. No VB.  Objective: BP 109/62 (BP Location: Right Arm)   Pulse 89   Temp 98.8 F (37.1 C) (Oral)   Resp 15   Ht 5\' 2"  (1.575 m)   Wt 105.2 kg (232 lb)   LMP 06/01/2015 (Exact Date)   SpO2 100%   BMI 42.43 kg/m  No intake/output data recorded. No intake/output data recorded.  Today's Vitals   06/20/16 1904 06/20/16 2015 06/20/16 2145 06/20/16 2225  BP:  (!) 109/58 (!) 109/49 109/62  Pulse:  96 76 89  Resp:  19 16 15   Temp: 97.1 F (36.2 C) 98.6 F (37 C) 98.8 F (37.1 C)   TempSrc: Axillary Oral Oral   SpO2:      Weight:      Height:      PainSc:   Asleep     FHT: BL 135 w/ moderate variability, +accels, no decels UC:   none SVE:   Dilation: 3.5 Effacement (%): 50 Station: -3 Exam by:: Craige Cotta, RN @ [phone] Pitocin at 3 mU/min (stopped at 1835 and restarted at 2145)  Assessment:  IUP at 39.6 wks AMA Prolonged ROM -- 29+ hrs w/o s/s of infection - T max 98.6 GBS positive - adequately treated  Plan: Continue Pitocin w/ re-evaluation at midnight per pt's request. Monitor closely.  Sherre Scarlet, CNM 06/20/16, 10:51 PM  Addendum: Received call from RN. Pt desires change in plan - desires to be re-evaluated after discussion with aunt. Cat 1 tracing.  06/21/16, 12 am  Addendum: Called to bedside at ~ 1 AM. Plan of care discussed. Decision made by pt to proceed w/ c-section. After discussion w/ attending of record, Risks, Benefits, Alternatives including but not limited to bleeding, infection and injury were discussed with the patient. She  verbalized understanding and consent signed and witnessed. Ancef 2  grams IV on call to OR.  Sherre Scarlet, CNM 06/21/16, 1:15 AM

## 2016-06-20 NOTE — Progress Notes (Signed)
  Subjective: Has been sleeping at intervals.  Objective: BP 128/78   Pulse 75   Temp 97.6 F (36.4 C) (Oral)   Resp 16   Ht 5\' 2"  (1.575 m)   Wt 105.2 kg (232 lb)   LMP 06/01/2015 (Exact Date)   BMI 42.43 kg/m  No intake/output data recorded. No intake/output data recorded.  FHT: Category 1 UC:   irregular, every 2-6 minutes SVE:   Dilation: 2.5 Effacement (%): 70 Station: -3 Exam by:: V Shlonda Dolloff at  Pitocin at 19 mu/min  Assessment:  PROM at term x 14 hours GBS positive On pitocin  Plan: Continue current care.  Nigel Bridgeman CNM 06/20/2016, 6:44 AM

## 2016-06-20 NOTE — Progress Notes (Signed)
  Subjective: Feeling more cramping at times, but still comfortable.  Objective: BP 124/73   Pulse 76   Temp 98 F (36.7 C) (Oral)   Resp 18   Ht 5\' 2"  (1.575 m)   Wt 105.2 kg (232 lb)   LMP 06/01/2015 (Exact Date)   BMI 42.43 kg/m  No intake/output data recorded. No intake/output data recorded.  FHT: Category 1 UC:   irregular, every 3-5 minutes SVE:   Dilation: 2.5 Effacement (%): 70 Station: -3 Exam by:: Manfred Arch at 11:19p Pitocin at 9 mu/min  Assessment:  PROM at term x 10 hours GBS positive  Plan: Continue current care Pain med prn.  Nigel Bridgeman CNM 06/20/2016, 2:39 AM

## 2016-06-20 NOTE — Progress Notes (Signed)
Pitocin d/c'd for a three hour break per Dr Sallye Ober. Restart pitocin at 2135. Night shift RN aware. Patient verbalizes understanding with no further questions or concerns at this time. Will continue to monitor.

## 2016-06-21 ENCOUNTER — Encounter (HOSPITAL_COMMUNITY): Payer: Self-pay | Admitting: Anesthesiology

## 2016-06-21 ENCOUNTER — Encounter (HOSPITAL_COMMUNITY)
Admission: AD | Disposition: A | Discharge status: Home / Self Care | Payer: Self-pay | Source: Ambulatory Visit | Attending: Obstetrics and Gynecology

## 2016-06-21 ENCOUNTER — Inpatient Hospital Stay (HOSPITAL_COMMUNITY): Payer: 59 | Admitting: Anesthesiology

## 2016-06-21 LAB — CBC
HCT: 35 % — ABNORMAL LOW (ref 36.0–46.0)
Hemoglobin: 11.5 g/dL — ABNORMAL LOW (ref 12.0–15.0)
MCH: 29.6 pg (ref 26.0–34.0)
MCHC: 32.9 g/dL (ref 30.0–36.0)
MCV: 90.2 fL (ref 78.0–100.0)
Platelets: 241 10*3/uL (ref 150–400)
RBC: 3.88 MIL/uL (ref 3.87–5.11)
RDW: 15.4 % (ref 11.5–15.5)
WBC: 15.9 10*3/uL — ABNORMAL HIGH (ref 4.0–10.5)

## 2016-06-21 SURGERY — Surgical Case
Anesthesia: Spinal | Site: Abdomen

## 2016-06-21 MED ORDER — MORPHINE SULFATE (PF) 0.5 MG/ML IJ SOLN
INTRAMUSCULAR | Status: DC | PRN
  Administered 2016-06-21: .2 mg via INTRATHECAL

## 2016-06-21 MED ORDER — ACETAMINOPHEN 325 MG PO TABS
650.0000 mg | ORAL_TABLET | ORAL | Status: DC | PRN
  Administered 2016-06-23: 650 mg via ORAL
  Filled 2016-06-21: qty 2

## 2016-06-21 MED ORDER — SIMETHICONE 80 MG PO CHEW
80.0000 mg | CHEWABLE_TABLET | ORAL | Status: DC
  Administered 2016-06-21 – 2016-06-22 (×2): 80 mg via ORAL
  Filled 2016-06-21 (×2): qty 1

## 2016-06-21 MED ORDER — IBUPROFEN 600 MG PO TABS: 600.0000 mg | ORAL_TABLET | Freq: Four times a day (QID) | ORAL | Status: DC

## 2016-06-21 MED ORDER — PHENYLEPHRINE HCL 10 MG/ML IJ SOLN
INTRAMUSCULAR | Status: DC | PRN
  Administered 2016-06-21: 80 ug via INTRAVENOUS

## 2016-06-21 MED ORDER — SCOPOLAMINE 1 MG/3DAYS TD PT72
MEDICATED_PATCH | TRANSDERMAL | Status: AC
  Filled 2016-06-21: qty 1

## 2016-06-21 MED ORDER — PHENYLEPHRINE 8 MG IN D5W 100 ML (0.08MG/ML) PREMIX OPTIME
INJECTION | INTRAVENOUS | Status: AC
  Filled 2016-06-21: qty 100

## 2016-06-21 MED ORDER — DIPHENHYDRAMINE HCL 50 MG/ML IJ SOLN
12.5000 mg | INTRAMUSCULAR | Status: DC | PRN
Start: 2016-06-21 — End: 2016-06-23

## 2016-06-21 MED ORDER — SIMETHICONE 80 MG PO CHEW
80.0000 mg | CHEWABLE_TABLET | Freq: Three times a day (TID) | ORAL | Status: DC
  Administered 2016-06-21 – 2016-06-23 (×7): 80 mg via ORAL
  Filled 2016-06-21 (×7): qty 1

## 2016-06-21 MED ORDER — FENTANYL CITRATE (PF) 100 MCG/2ML IJ SOLN
INTRAMUSCULAR | Status: AC
  Filled 2016-06-21: qty 2

## 2016-06-21 MED ORDER — BUPIVACAINE IN DEXTROSE 0.75-8.25 % IT SOLN
INTRATHECAL | Status: DC | PRN
  Administered 2016-06-21: 1.6 mL via INTRATHECAL

## 2016-06-21 MED ORDER — SENNOSIDES-DOCUSATE SODIUM 8.6-50 MG PO TABS
2.0000 | ORAL_TABLET | ORAL | Status: DC
  Administered 2016-06-21 – 2016-06-22 (×2): 2 via ORAL
  Filled 2016-06-21 (×2): qty 2

## 2016-06-21 MED ORDER — IBUPROFEN 600 MG PO TABS
600.0000 mg | ORAL_TABLET | Freq: Four times a day (QID) | ORAL | Status: DC
  Administered 2016-06-21 – 2016-06-23 (×10): 600 mg via ORAL
  Filled 2016-06-21 (×10): qty 1

## 2016-06-21 MED ORDER — OXYTOCIN 40 UNITS IN LACTATED RINGERS INFUSION - SIMPLE MED: 2.5000 [IU]/h | INTRAVENOUS | Status: AC

## 2016-06-21 MED ORDER — SIMETHICONE 80 MG PO CHEW: 80.0000 mg | CHEWABLE_TABLET | ORAL | Status: DC | PRN

## 2016-06-21 MED ORDER — MENTHOL 3 MG MT LOZG: 1.0000 | LOZENGE | OROMUCOSAL | Status: DC | PRN

## 2016-06-21 MED ORDER — ONDANSETRON HCL 4 MG/2ML IJ SOLN
INTRAMUSCULAR | Status: AC
  Filled 2016-06-21: qty 2

## 2016-06-21 MED ORDER — DEXAMETHASONE SODIUM PHOSPHATE 4 MG/ML IJ SOLN
INTRAMUSCULAR | Status: AC
  Filled 2016-06-21: qty 1

## 2016-06-21 MED ORDER — CEFAZOLIN SODIUM-DEXTROSE 2-4 GM/100ML-% IV SOLN
2.0000 g | Freq: Once | INTRAVENOUS | Status: DC
Start: 2016-06-21 — End: 2016-06-21
  Filled 2016-06-21: qty 100

## 2016-06-21 MED ORDER — OXYCODONE-ACETAMINOPHEN 5-325 MG PO TABS: 2.0000 | ORAL_TABLET | ORAL | Status: DC | PRN

## 2016-06-21 MED ORDER — MEPERIDINE HCL 25 MG/ML IJ SOLN: 6.2500 mg | INTRAMUSCULAR | Status: DC | PRN

## 2016-06-21 MED ORDER — PRENATAL MULTIVITAMIN CH
1.0000 | ORAL_TABLET | Freq: Every day | ORAL | Status: DC
Start: 2016-06-21 — End: 2016-06-23
  Administered 2016-06-21 – 2016-06-23 (×3): 1 via ORAL
  Filled 2016-06-21 (×3): qty 1

## 2016-06-21 MED ORDER — NALBUPHINE HCL 10 MG/ML IJ SOLN: 5.0000 mg | INTRAMUSCULAR | Status: DC | PRN

## 2016-06-21 MED ORDER — COCONUT OIL OIL
1.0000 "application " | TOPICAL_OIL | Status: DC | PRN
  Administered 2016-06-22: 1 via TOPICAL
  Filled 2016-06-21: qty 120

## 2016-06-21 MED ORDER — PHENYLEPHRINE 8 MG IN D5W 100 ML (0.08MG/ML) PREMIX OPTIME
INJECTION | INTRAVENOUS | Status: DC | PRN
  Administered 2016-06-21: 80 ug/min via INTRAVENOUS
  Administered 2016-06-21: 60 ug/min via INTRAVENOUS

## 2016-06-21 MED ORDER — NALBUPHINE HCL 10 MG/ML IJ SOLN: 5.0000 mg | Freq: Once | INTRAMUSCULAR | Status: DC | PRN

## 2016-06-21 MED ORDER — DIPHENHYDRAMINE HCL 25 MG PO CAPS: 25.0000 mg | ORAL_CAPSULE | Freq: Four times a day (QID) | ORAL | Status: DC | PRN

## 2016-06-21 MED ORDER — KETOROLAC TROMETHAMINE 30 MG/ML IJ SOLN: 30.0000 mg | Freq: Once | INTRAMUSCULAR | Status: DC

## 2016-06-21 MED ORDER — KETOROLAC TROMETHAMINE 30 MG/ML IJ SOLN: 30.0000 mg | Freq: Four times a day (QID) | INTRAMUSCULAR | Status: DC | PRN

## 2016-06-21 MED ORDER — CEFAZOLIN SODIUM-DEXTROSE 2-3 GM-% IV SOLR
INTRAVENOUS | Status: DC | PRN
  Administered 2016-06-21: 2 g via INTRAVENOUS

## 2016-06-21 MED ORDER — FAMOTIDINE 20 MG PO TABS: 20.0000 mg | ORAL_TABLET | Freq: Every day | ORAL | Status: DC | PRN

## 2016-06-21 MED ORDER — OXYCODONE-ACETAMINOPHEN 5-325 MG PO TABS
1.0000 | ORAL_TABLET | ORAL | Status: DC | PRN
Start: 2016-06-21 — End: 2016-06-23
  Administered 2016-06-21 – 2016-06-23 (×7): 1 via ORAL
  Filled 2016-06-21 (×7): qty 1

## 2016-06-21 MED ORDER — CEFAZOLIN SODIUM-DEXTROSE 2-4 GM/100ML-% IV SOLN
INTRAVENOUS | Status: AC
  Filled 2016-06-21: qty 100

## 2016-06-21 MED ORDER — NALBUPHINE HCL 10 MG/ML IJ SOLN
5.0000 mg | INTRAMUSCULAR | Status: DC | PRN
Start: 2016-06-21 — End: 2016-06-23

## 2016-06-21 MED ORDER — DIBUCAINE 1 % RE OINT: 1.0000 "application " | TOPICAL_OINTMENT | RECTAL | Status: DC | PRN

## 2016-06-21 MED ORDER — KETOROLAC TROMETHAMINE 30 MG/ML IJ SOLN
30.0000 mg | Freq: Four times a day (QID) | INTRAMUSCULAR | Status: DC | PRN
  Administered 2016-06-21: 30 mg via INTRAMUSCULAR

## 2016-06-21 MED ORDER — PROMETHAZINE HCL 25 MG/ML IJ SOLN: 6.2500 mg | INTRAMUSCULAR | Status: DC | PRN

## 2016-06-21 MED ORDER — HYDROMORPHONE HCL 1 MG/ML IJ SOLN: 0.2500 mg | INTRAMUSCULAR | Status: DC | PRN

## 2016-06-21 MED ORDER — KETOROLAC TROMETHAMINE 30 MG/ML IJ SOLN
INTRAMUSCULAR | Status: AC
  Filled 2016-06-21: qty 1

## 2016-06-21 MED ORDER — SODIUM CHLORIDE 0.9% FLUSH: 3.0000 mL | INTRAVENOUS | Status: DC | PRN

## 2016-06-21 MED ORDER — ZOLPIDEM TARTRATE 5 MG PO TABS: 5.0000 mg | ORAL_TABLET | Freq: Every evening | ORAL | Status: DC | PRN

## 2016-06-21 MED ORDER — LACTATED RINGERS IV SOLN
INTRAVENOUS | Status: DC
  Administered 2016-06-21: 12:00:00 via INTRAVENOUS

## 2016-06-21 MED ORDER — SCOPOLAMINE 1 MG/3DAYS TD PT72
MEDICATED_PATCH | TRANSDERMAL | Status: DC | PRN
  Administered 2016-06-21: 1 via TRANSDERMAL

## 2016-06-21 MED ORDER — FENTANYL CITRATE (PF) 100 MCG/2ML IJ SOLN
INTRAMUSCULAR | Status: DC | PRN
  Administered 2016-06-21: 20 ug via INTRATHECAL

## 2016-06-21 MED ORDER — OXYTOCIN 10 UNIT/ML IJ SOLN
INTRAMUSCULAR | Status: AC
  Filled 2016-06-21: qty 4

## 2016-06-21 MED ORDER — NALOXONE HCL 0.4 MG/ML IJ SOLN: 0.4000 mg | INTRAMUSCULAR | Status: DC | PRN

## 2016-06-21 MED ORDER — ONDANSETRON HCL 4 MG/2ML IJ SOLN
INTRAMUSCULAR | Status: DC | PRN
  Administered 2016-06-21: 4 mg via INTRAVENOUS

## 2016-06-21 MED ORDER — SCOPOLAMINE 1 MG/3DAYS TD PT72
1.0000 | MEDICATED_PATCH | Freq: Once | TRANSDERMAL | Status: DC
  Filled 2016-06-21: qty 1

## 2016-06-21 MED ORDER — IBUPROFEN 600 MG PO TABS: 600.0000 mg | ORAL_TABLET | Freq: Four times a day (QID) | ORAL | Status: DC | PRN

## 2016-06-21 MED ORDER — DIPHENHYDRAMINE HCL 25 MG PO CAPS: 25.0000 mg | ORAL_CAPSULE | ORAL | Status: DC | PRN

## 2016-06-21 MED ORDER — TETANUS-DIPHTH-ACELL PERTUSSIS 5-2.5-18.5 LF-MCG/0.5 IM SUSP: 0.5000 mL | Freq: Once | INTRAMUSCULAR | Status: DC

## 2016-06-21 MED ORDER — ACETAMINOPHEN 500 MG PO TABS
1000.0000 mg | ORAL_TABLET | Freq: Four times a day (QID) | ORAL | Status: AC
  Administered 2016-06-21 (×2): 1000 mg via ORAL
  Filled 2016-06-21 (×2): qty 2

## 2016-06-21 MED ORDER — NALOXONE HCL 2 MG/2ML IJ SOSY
1.0000 ug/kg/h | PREFILLED_SYRINGE | INTRAVENOUS | Status: DC | PRN
  Filled 2016-06-21: qty 2

## 2016-06-21 MED ORDER — MORPHINE SULFATE-NACL 0.5-0.9 MG/ML-% IV SOSY
PREFILLED_SYRINGE | INTRAVENOUS | Status: AC
  Filled 2016-06-21: qty 1

## 2016-06-21 MED ORDER — ONDANSETRON HCL 4 MG/2ML IJ SOLN: 4.0000 mg | Freq: Three times a day (TID) | INTRAMUSCULAR | Status: DC | PRN

## 2016-06-21 MED ORDER — WITCH HAZEL-GLYCERIN EX PADS: 1.0000 "application " | MEDICATED_PAD | CUTANEOUS | Status: DC | PRN

## 2016-06-21 MED ORDER — DEXAMETHASONE SODIUM PHOSPHATE 4 MG/ML IJ SOLN
INTRAMUSCULAR | Status: DC | PRN
  Administered 2016-06-21: 4 mg via INTRAVENOUS

## 2016-06-21 MED ORDER — SODIUM CHLORIDE 0.9 % IR SOLN
Status: DC | PRN
  Administered 2016-06-21: 1000 mL

## 2016-06-21 SURGICAL SUPPLY — 38 items
APL SKNCLS STERI-STRIP NONHPOA (GAUZE/BANDAGES/DRESSINGS) ×1
BENZOIN TINCTURE PRP APPL 2/3 (GAUZE/BANDAGES/DRESSINGS) ×2 IMPLANT
CHLORAPREP W/TINT 26ML (MISCELLANEOUS) ×2 IMPLANT
CLAMP CORD UMBIL (MISCELLANEOUS) IMPLANT
CLOTH BEACON ORANGE TIMEOUT ST (SAFETY) ×2 IMPLANT
CONTAINER PREFILL 10% NBF 15ML (MISCELLANEOUS) IMPLANT
DRAIN JACKSON PRT FLT 10 (DRAIN) ×1 IMPLANT
DRSG OPSITE POSTOP 4X10 (GAUZE/BANDAGES/DRESSINGS) ×2 IMPLANT
ELECT REM PT RETURN 9FT ADLT (ELECTROSURGICAL) ×2
ELECTRODE REM PT RTRN 9FT ADLT (ELECTROSURGICAL) ×1 IMPLANT
EVACUATOR SILICONE 100CC (DRAIN) ×1 IMPLANT
EXTRACTOR VACUUM M CUP 4 TUBE (SUCTIONS) IMPLANT
GLOVE BIO SURGEON STRL SZ 6.5 (GLOVE) ×2 IMPLANT
GLOVE BIO SURGEON STRL SZ7 (GLOVE) ×1 IMPLANT
GLOVE BIOGEL PI IND STRL 7.0 (GLOVE) ×2 IMPLANT
GLOVE BIOGEL PI INDICATOR 7.0 (GLOVE) ×5
GOWN STRL REUS W/TWL LRG LVL3 (GOWN DISPOSABLE) ×5 IMPLANT
KIT ABG SYR 3ML LUER SLIP (SYRINGE) IMPLANT
NDL HYPO 25X5/8 SAFETYGLIDE (NEEDLE) IMPLANT
NEEDLE HYPO 25X5/8 SAFETYGLIDE (NEEDLE) IMPLANT
NS IRRIG 1000ML POUR BTL (IV SOLUTION) ×2 IMPLANT
PACK C SECTION WH (CUSTOM PROCEDURE TRAY) ×2 IMPLANT
PAD OB MATERNITY 4.3X12.25 (PERSONAL CARE ITEMS) ×2 IMPLANT
PENCIL SMOKE EVAC W/HOLSTER (ELECTROSURGICAL) ×2 IMPLANT
RTRCTR C-SECT PINK 25CM LRG (MISCELLANEOUS) IMPLANT
SPONGE DRAIN TRACH 4X4 STRL 2S (GAUZE/BANDAGES/DRESSINGS) ×1 IMPLANT
STRIP CLOSURE SKIN 1/2X4 (GAUZE/BANDAGES/DRESSINGS) ×2 IMPLANT
SUT CHROMIC 0 CT 1 (SUTURE) ×2 IMPLANT
SUT MNCRL AB 3-0 PS2 27 (SUTURE) ×2 IMPLANT
SUT PLAIN 2 0 (SUTURE) ×4
SUT PLAIN 2 0 XLH (SUTURE) ×2 IMPLANT
SUT PLAIN ABS 2-0 CT1 27XMFL (SUTURE) ×2 IMPLANT
SUT SILK 2 0 SH (SUTURE) IMPLANT
SUT SILK 3 0 SH 30 (SUTURE) ×1 IMPLANT
SUT VIC AB 0 CTX 36 (SUTURE) ×8
SUT VIC AB 0 CTX36XBRD ANBCTRL (SUTURE) ×4 IMPLANT
TOWEL OR 17X24 6PK STRL BLUE (TOWEL DISPOSABLE) ×2 IMPLANT
TRAY FOLEY CATH SILVER 14FR (SET/KITS/TRAYS/PACK) ×2 IMPLANT

## 2016-06-21 NOTE — Lactation Note (Signed)
This note was copied from a baby's chart. Lactation Consultation Note New mom, labored long, has generalized edema. Long pendulum breast, hand expression taught, no colostrum noted at this time. Baby sleepy at this time. BF well in PACU. Mom encouraged to feed baby 8-12 times/24 hours and with feeding cues. Mom encouraged to waken baby for feeds. Referred to Baby and Me Book in Breastfeeding section Pg. 22-23 for position options and Proper latch demonstration. Educated about newborn behavior, I&O, cluster feeding, supply and demand. Mom knows to pump q3h for 15-20 min. Mom encouraged to do skin-to-skin. WH/LC brochure given w/resources, support groups and LC services. Mom plans to BF for 8 weeks until she returns to work, then pump and bottle while working 12 hr days, BF when home. Mom will be allowed to pump at work. Encouraged to BF if possibile before and after returns home from work. Mom has DEBP at home.  Patient Name: [name] [name] [id number] Date: 06/21/2016 Reason for consult: Initial assessment   Maternal Data Has patient been taught Hand Expression?: Yes Does the patient have breastfeeding experience prior to this delivery?: No  Feeding    LATCH Score/Interventions       Type of Nipple: Everted at rest and after stimulation  Comfort (Breast/Nipple): Soft / non-tender     Intervention(s): Breastfeeding basics reviewed;Support Pillows;Position options;Skin to skin     Lactation Tools Discussed/Used WIC Program: No   Consult Status Consult Status: Follow-up Date: 06/21/16 Follow-up type: In-patient    Urias Sheek, Diamond Nickel 06/21/2016, 10:50 AM

## 2016-06-21 NOTE — Plan of Care (Signed)
Problem: Life Cycle: Goal: Ability to make normal progression through stages of labor will improve Outcome: Not Met (add Reason) Failed induction  Goal: Ability to effectively push during vaginal delivery will improve Outcome: Not Applicable Date Met: 94/70/96 Pt to deliver via C/S

## 2016-06-21 NOTE — Progress Notes (Signed)
Patient ID: [name] [name], female   DOB: February 12, 1981, 35 y.o.   MRN: [id number] PT states she now wants a CS BP 111/65   Pulse 82   Temp 98 F (36.7 C) (Oral)   Resp 19   Ht 5\' 2"  (1.575 m)   Wt 232 lb (105.2 kg)   LMP 06/01/2015 (Exact Date)   SpO2 100%   BMI 42.43 kg/m  Cat 1 PROM failed induction Ancef surgical prophylaxsis R&B reviewed

## 2016-06-21 NOTE — Lactation Note (Signed)
This note was copied from a baby's chart. Lactation Consultation Note  Patient Name: [name] [name] WNUUV'O Date: 06/21/2016 Reason for consult: Follow-up assessment  Mom had called out for assist, but Mom was able to successfully latch infant before I arrived. I observed swallows initially. Sleepy behavior of infant during 1st 24 hrs discussed. Mom praised for her efforts.   Lurline Hare [facility] 06/21/2016, 5:41 PM

## 2016-06-21 NOTE — Anesthesia Postprocedure Evaluation (Signed)
Anesthesia Post Note  Patient: [name] [name]  Procedure(s) Performed: Procedure(s) (LRB): CESAREAN SECTION (N/A)  Patient location during evaluation: PACU Anesthesia Type: Spinal Level of consciousness: awake Pain management: pain level controlled Vital Signs Assessment: post-procedure vital signs reviewed and stable Respiratory status: spontaneous breathing Cardiovascular status: stable Postop Assessment: no headache, no backache, spinal receding and no signs of nausea or vomiting Anesthetic complications: no     Last Vitals:  Vitals:   06/21/16 0430 06/21/16 0431  BP:    Pulse: 73 65  Resp: 16 14  Temp:      Last Pain:  Vitals:   06/21/16 0420  TempSrc: Oral  PainSc: 0-No pain   Pain Goal:                 [name] [name],[name]

## 2016-06-21 NOTE — Op Note (Signed)
Patient ID: [name] [name], female   DOB: [birth year], 35 y.o.   MRN: [id number] Cesarean Section Procedure Note   [name]  06/19/2016 - 06/21/2016  Indications: PROM failed induction of labor   Pre-operative Diagnosis: failed induction and prolonged rupture of membranes .   Post-operative Diagnosis: Same   Surgeon: Surgeon(s) and Role:    * Jaymes Graff, MD - Primary   Assistants: Sherre Scarlet CNM   Anesthesia: epidural   Procedure Details:  The patient was seen in the Holding Room. The risks, benefits, complications, treatment options, and expected outcomes were discussed with the patient. The patient concurred with the proposed plan, giving informed consent. identified as Research scientist (medical) and the procedure verified as C-Section Delivery. A Time Out was held and the above information confirmed.  After induction of anesthesia, the patient was draped and prepped in the usual sterile manner. A transverse incision was made and carried down through the subcutaneous tissue to the fascia. Fascial incision was made in the midline and extended transversely. The fascia was separated from the underlying rectus muscle superiorly and inferiorly. The peritoneum was identified and entered. Peritoneal incision was extended longitudinally with good visualization of bowel and bladder. The utero-vesical peritoneal reflection was incised transversely and the bladder flap was bluntly freed from the lower uterine segment.  An alexsis retractor was placed in the abdomen.   A low transverse uterine incision was made. Delivered from cephalic presentation was a  infant, with Apgar scores of 10 at one minute and 10 at five minutes. Cord ph was not sent the umbilical cord was clamped and cut cord blood was obtained for evaluation. The placenta was removed Intact and appeared normal. The uterine outline, tubes and ovaries appeared normal}. The uterine incision was closed with running locked  sutures of 0Vicryl. A second layer 0 vicrlyl was used to imbricate the uterine incision    Hemostasis was observed. Lavage was carried out until clear. The alexsis was removed.  The peritoneum was closed with 0 chromic.  The muscles were examined and any bleeders were made hemostatic using bovie cautery device.   The fascia was then reapproximated with running sutures of 0 vicryl.  The subcutaneous tissue was reapproximated  With interrupted stitches using 2-0 plain gut. The subcuticular closure was performed using 3-32monocryl     Instrument, sponge, and needle counts were correct prior the abdominal closure and were correct at the conclusion of the case.    Findings: infant was delivered from vtx presentation. The fluid was clear.  There was a nuchal cord times one which was reduced.  The uterus tubes and ovaries appeared normal.     Estimated Blood Loss:   Total IV Fluids:   Urine Output: 400CC OF clear urine  Specimens: placenta to path  Complications: no complications  Disposition: PACU - hemodynamically stable.   Maternal Condition: stable   Baby condition / location:  Couplet care / Skin to Skin  Attending Attestation: I performed the procedure.   Signed: Surgeon(s): Jaymes Graff, MD

## 2016-06-21 NOTE — Addendum Note (Signed)
Addendum  created 06/21/16 0654 by Collier Flowers, CRNA   Anesthesia Attestations filed

## 2016-06-21 NOTE — Anesthesia Procedure Notes (Signed)
Spinal  Patient location during procedure: OR Start time: 06/21/2016 2:40 AM End time: 06/21/2016 2:45 AM Staffing Anesthesiologist: Leilani Able Performed: anesthesiologist  Preanesthetic Checklist Completed: patient identified, surgical consent, pre-op evaluation, timeout performed, IV checked, risks and benefits discussed and monitors and equipment checked Spinal Block Patient position: sitting Prep: site prepped and draped and DuraPrep Patient monitoring: heart rate, cardiac monitor, continuous pulse ox and blood pressure Approach: midline Location: L3-4 Injection technique: single-shot Needle Needle type: Sprotte  Needle gauge: 24 G Needle length: 9 cm Needle insertion depth: 7 cm Assessment Sensory level: T6 Events: paresthesia Additional Notes R leg X 1

## 2016-06-21 NOTE — Anesthesia Preprocedure Evaluation (Signed)
Anesthesia Evaluation  Patient identified by MRN, date of birth, ID band Patient awake    Reviewed: Allergy & Precautions, H&P , NPO status , Patient's Chart, lab work & pertinent test results  Airway Mallampati: II  TM Distance: >3 FB Neck ROM: full    Dental no notable dental hx.    Pulmonary neg pulmonary ROS, former smoker,    Pulmonary exam normal        Cardiovascular negative cardio ROS Normal cardiovascular exam     Neuro/Psych negative psych ROS   GI/Hepatic negative GI ROS, Neg liver ROS,   Endo/Other  Morbid obesity  Renal/GU negative Renal ROS     Musculoskeletal   Abdominal (+) + obese,   Peds  Hematology negative hematology ROS (+)   Anesthesia Other Findings   Reproductive/Obstetrics (+) Pregnancy                             Anesthesia Physical Anesthesia Plan  ASA: III  Anesthesia Plan: Spinal   Post-op Pain Management:    Induction:   Airway Management Planned:   Additional Equipment:   Intra-op Plan:   Post-operative Plan:   Informed Consent: I have reviewed the patients History and Physical, chart, labs and discussed the procedure including the risks, benefits and alternatives for the proposed anesthesia with the patient or authorized representative who has indicated his/her understanding and acceptance.     Plan Discussed with: CRNA and Surgeon  Anesthesia Plan Comments:         Anesthesia Quick Evaluation

## 2016-06-21 NOTE — Transfer of Care (Signed)
Immediate Anesthesia Transfer of Care Note  Patient: [name] [name]  Procedure(s) Performed: Procedure(s): CESAREAN SECTION (N/A)  Patient Location: PACU  Anesthesia Type:Spinal  Level of Consciousness: awake, alert  and oriented  Airway & Oxygen Therapy: Patient Spontanous Breathing  Post-op Assessment: Report given to RN and Post -op Vital signs reviewed and stable  Post vital signs: Reviewed and stable  Last Vitals:  Vitals:   06/20/16 2321 06/21/16 0105  BP: 124/77 111/65  Pulse: 89 82  Resp: 18 19  Temp: 36.7 C     Last Pain:  Vitals:   06/21/16 0105  TempSrc:   PainSc: 0-No pain         Complications: No apparent anesthesia complications

## 2016-06-22 NOTE — Lactation Note (Addendum)
This note was copied from a baby's chart. Lactation Consultation Note follow up consult with this mom and term baby, now 59 hours old. The baby has been latching only to mom's left, smaller breast. Right breast is large and pendolous.  I helped mom position in chair for football hold, applied 20 nipple shield filled with formula, and baby then latched and finished 12 ml's of formula, and continued to suckle after finishing the formula. Baby with tight fists at beginning of feeding, looser half was through.  I set  Up DE{P and instructed mom how to pump in initiation setting.  Dr. Tommi Emery present in room at end of my consult with mom and baby. I reviewed with her what I had noted and how I had interviened with mom and baby. On exam of baby's mouth, his tongue is held far behind his gum line, and frenulum imbedded in thick tissue. Frenulum may be short, but may also appear that way due to recessed chin. I advised mom to do lots of skin to skin, and once home, tummy time, to help pull his chin forward. O/p consult to be made for mom and baby, prior to discharge.  Patient Name: [name] [name] [id number] Date: 06/22/2016 Reason for consult: Follow-up assessment   Maternal Data    Feeding Feeding Type: Formula Length of feed:  (baby still latached after 15 minutes, when I left the room)  LATCH Score/Interventions Latch: Repeated attempts needed to sustain latch, nipple held in mouth throughout feeding, stimulation needed to elicit sucking reflex. (baby latached with 20 nipple shiled filled with formula) Intervention(s): Adjust position;Assist with latch  Audible Swallowing: A few with stimulation (formula, none after formula gone) Intervention(s): Skin to skin;Hand expression  Type of Nipple: Everted at rest and after stimulation  Comfort (Breast/Nipple): Soft / non-tender (right breast twice the size of the left)     Hold (Positioning): Assistance needed to correctly position infant  at breast and maintain latch. Intervention(s): Breastfeeding basics reviewed;Support Pillows;Position options;Skin to skin  LATCH Score: 7  Lactation Tools Discussed/Used Tools: Nipple Shields Nipple shield size: 20 Pump Review: Setup, frequency, and cleaning;Milk Storage;Other (comment) (hand expression and application and care of shield) Initiated by:: Danton Clap, RN, IBCLC Date initiated:: 06/22/16   Consult Status Consult Status: Follow-up Date: 06/23/16 Follow-up type: In-patient    Alfred Levins 06/22/2016, 8:57 AM

## 2016-06-22 NOTE — Progress Notes (Signed)
Subjective: Postop Day 1: Cesarean Delivery No complaints.  Pain controlled.  Lochia normal.  Breast feeding yes.  Objective: Temp:  [97.9 F (36.6 C)-98.4 F (36.9 C)] 98.1 F (36.7 C) (08/05 0630) Pulse Rate:  [57-62] 60 (08/05 0630) Resp:  [16-18] 18 (08/05 0630) BP: (105-113)/(55-75) 111/68 (08/05 0630)  JP 42 ml out yesterday.  JP drain ~ 5 ml in the bulb currently.  Physical Exam: Gen: NAD, pt upright in chair using double electric hospital grade breast pump Lochia: Not visualized Uterine Fundus: firm, appropriately tender Incision: Dressing clean and dry.   DVT Evaluation: + Edema present, no calf tenderness bilaterally    Recent Labs  06/21/16 0615  HGB 11.5*  HCT 35.0*    Assessment/Plan: Status post C-section-doing well postoperatively. Lactation support. Encouraged ambulation in hallways TID. Continue routine postpartum/post op care.     Geryl Rankins 06/22/2016, 2:42 PM

## 2016-06-23 MED ORDER — NORETHINDRONE 0.35 MG PO TABS: 1.0000 | ORAL_TABLET | Freq: Every day | ORAL | 11 refills | Status: DC

## 2016-06-23 MED ORDER — OXYCODONE-ACETAMINOPHEN 5-325 MG PO TABS: 1.0000 | ORAL_TABLET | ORAL | 0 refills | Status: DC | PRN

## 2016-06-23 MED ORDER — IBUPROFEN 600 MG PO TABS: 600.0000 mg | ORAL_TABLET | Freq: Four times a day (QID) | ORAL | 2 refills | Status: DC | PRN

## 2016-06-23 MED ORDER — DOCUSATE SODIUM 100 MG PO CAPS: 100.0000 mg | ORAL_CAPSULE | Freq: Two times a day (BID) | ORAL | 0 refills | Status: DC

## 2016-06-23 NOTE — Discharge Summary (Signed)
Cesarean Section Delivery Discharge Summary  [name] [name]  DOB:    [birth year] MRN:    [id number] CSN:    [id number]  Date of admission:                 06/19/16   Date of discharge:                  06/23/16  Procedures this admission:  Date of Delivery: 06/21/16  Newborn Data:  Live born female  Birth Weight: 8 lb 5.3 oz (3779 g) APGARS: 10, 10  Home with mother. Name: [name] [name] Circumcision Plan: None  History of Present Illness:  [name] is a 35 y.o. female, G1P1001, who presents at 2462w0d weeks gestation. [facility]   Her pregnancy has been complicated by:  Patient Active Problem List   Diagnosis Date Noted  . Status post primary low transverse cesarean section 06/22/2016  . Edema during pregnancy in second trimester 03/07/2016  . Hx of migraines 03/07/2016  . Situational stress 11/24/2014  . HSV (herpes simplex virus) anogenital infection 11/24/2014    Hospital Course--Unscheduled Cesarean:  Admitted 06/19/16. Positive GBS. Due to failed IOL and prolonged PROM, she was consented for cesarean, with Dr. Normand Sloopillard performing a Primary LTCS under spinal anesthesia, with delivery of a viable female infant, with weight and Apgars as listed above. Infant was in good condition and remained at the patient's bedside. The patient was taken to recovery in good condition. Patient planned to breastfeed. On post-op day 1, patient was doing well, tolerating a regular diet, with Hgb of 11.5. Throughout her stay, her physical exam was WNL, her incision was CDI, and her vital signs remained stable. By post-op day 1, she was up ad lib, tolerating a regular diet, with good pain control with po med. She was deemed to have received the full benefit of her hospital stay, and was discharged home in stable condition.   Feeding:  breast  Contraception:  oral progesterone-only  contraceptive  Hemoglobin Results:  CBC Latest Ref Rng & Units 06/21/2016 06/19/2016 11/24/2014  WBC 4.0 - 10.5 K/uL 15.9(H) 11.4(H) -  Hemoglobin 12.0 - 15.0 g/dL 11.5(L) 12.1 14.5  Hematocrit 36.0 - 46.0 % 35.0(L) 36.4 -  Platelets 150 - 400 K/uL 241 268 -   Prenatal labs: ABO, Rh: A/Positive/-- (01/04 0000)A+ Antibody: Negative (01/04 0000)Neg Rubella:  Immune RPR: Nonreactive (01/04 0000) NR HBsAg: Negative (01/04 0000) NR HIV: Non-reactive (01/04 0000) NR GBS: Positive (01/04 0000)Positive Sickle cell/Hgb electrophoresis:  NA Pap:  10/2015 WNL GC:  Negative 04/29/16 Chlamydia:  Negative 04/29/16 Genetic screenings:  Normal 1st trimester screen and AFP Glucola:  Elevated 1 hour GTT, then single abnormal value on 3 hour GTT Other:   Hgb 13.3 at NOB, 11.2 at 28 weeks   Discharge Physical Exam:   General: alert, cooperative, no distress and morbidly obese Lochia: appropriate Uterine Fundus: firm Abdomen:  + bowel sounds, NTND Incision: healing well, no dehiscence, no significant erythema, JP draining appropriately DVT Evaluation: No evidence of DVT seen on physical exam. Negative Homan's sign. No cords or calf tenderness. Calf/Ankle edema is present.  Intrapartum Procedures: cesarean: low cervical, transverse, GBS prophylaxis and JP drain Postpartum Procedures: none Complications-Operative and Postpartum: Morbidity  Discharge Diagnoses: Term Pregnancy-delivered, Failed induction and PROM x30+ hours  Discharge Information:  Activity:           pelvic rest Diet:  routine Medications: PNV, Ibuprofen, Colace, Percocet and Micronor Condition:      stable Instructions:  Discharge to: home  Follow-up Information    [facility]. Schedule an appointment as soon as possible for a visit in 1 week(s).   Specialty:  Obstetrics and Gynecology Why:  JP drainage check Contact information: [address] [place]  [place] [address] [phone]        Above f/u information per Dr. Katharine Look.   Sherre Scarlet CNM 06/23/2016

## 2016-06-23 NOTE — Lactation Note (Signed)
Lactation Consultation Note Follow up consult with this mom and term baby, now  5453 hours old. Mom sent the baby to CNS last night, so she could sleep. Mom is a single mom, no one here to help her, pregnant by IUI. Mom has not pumped but twice since yesterday. She was also not using nipple shield. I reviewed with mom the importance of pumping, even if she does not express any milk. Also reviewed the importance of hand expression after pumping, and using nipple shield to help baby stay latahed. I introduced syringe and tubing SNS, and showed mom how this could be used with nipple shield, to supplemnt at the breast. Mom would lie to try this with next feeding. Pump setting reviewed with mom, and she does have a DEP at home. O/P lactation appointment made for Friday, 8/11 at 1430. Mom receptive to teaching. I told her I would recommend not going home until tomorrow, since mom's milk coming in should help keeping baby interested at the breast, and mom has time to pump and learn to use tools provided, as she desires.   Patient Name: [name] [name] NWGNF'AToday's Date: 06/23/2016 Reason for consult: Follow-up assessment   Maternal Data    Feeding Feeding Type: Breast Fed Length of feed: 15 min  LATCH Score/Interventions Latch: Repeated attempts needed to sustain latch, nipple held in mouth throughout feeding, stimulation needed to elicit sucking reflex.  Audible Swallowing: None (nione from mom, yes from SNS)  Type of Nipple: Everted at rest and after stimulation  Comfort (Breast/Nipple): Filling, red/small blisters or bruises, mild/mod discomfort  Problem noted: Mild/Moderate discomfort (painful latch)  Hold (Positioning): Assistance needed to correctly position infant at breast and maintain latch. Intervention(s): Breastfeeding basics reviewed;Support Pillows;Position options;Skin to skin  LATCH Score: 5  Lactation Tools Discussed/Used Tools: Nipple Shields Nipple shield size:  20   Consult Status Consult Status: Follow-up Date: 06/24/16 Follow-up type: In-patient    Alfred LevinsLee, Maryclare Nydam Anne 06/23/2016, 8:40 AM

## 2016-06-23 NOTE — Clinical Social Work Maternal (Signed)
  CLINICAL SOCIAL WORK MATERNAL/CHILD NOTE  Patient Details  Name: [name] [name] MRN: [id number] Date of Birth: Aug 25, 1981  Date:  06/23/2016  Clinical Social Worker Initiating Note:   ([name] Robley lcsw) Date/ Time Initiated:  06/23/16/1152     Child's Name:      Legal Guardian:  Mother   Need for Interpreter:  None   Date of Referral:  06/23/16     Reason for Referral:  Other (Comment) (single parent/support)   Referral Source:  RN   Address:   [id number] [place] [phone])  Phone number:  [phone]   Household Members:  Self   Natural Supports (not living in the home):  Extended Family, Friends, Spouse/significant other   Professional Supports:     Employment: Full-time   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  Strengths:  Pediatrician chosen , Home prepared for child , Ability to meet basic needs    Risk Factors/Current Problems:  None   Cognitive State:  Alert , Able to Concentrate    Mood/Affect:  Happy , Calm , Comfortable    CSW Assessment: CSW met with pt re: consult for single mother/support.  Per pt, she is a "single-mother by choice" and denies not having support.  Per pt, she has a "very strong" support system, including 2 aunts who are scheduled to be with her at d/c.  She has an aunt who is in [place] currently, but cannot sleep at the hospital because she has OSA and is on CPAP.  This particular aunt plans on staying with pt/baby for two weeks, then another aunt will be arriving from [place] to help pt.  Pt also states that she has a boyfriend (not FOB) who is also very supportive but who lives in [place].  Pt denies any MH concerns and  has everything she needs to care for baby [name] [name].  No other social work needs identified.  Pt appreciative of CSW visit, emotional support provided.  CSW will  sign off, please re-consult as necessary.    CSW Plan/Description:  Psychosocial Support and Ongoing Assessment of Needs    [name] [name], [name] Roux, LCSW 06/23/2016, 11:57 AM

## 2016-06-23 NOTE — Discharge Instructions (Signed)
Iron-Rich Diet Iron is a mineral that helps your body to produce hemoglobin. Hemoglobin is a protein in your red blood cells that carries oxygen to your body's tissues. Eating too little iron may cause you to feel weak and tired, and it can increase your risk for infection. Eating enough iron is necessary for your body's metabolism, muscle function, and nervous system. Iron is naturally found in many foods. It can also be added to foods or fortified in foods. There are two types of dietary iron: Heme iron. Heme iron is absorbed by the body more easily than nonheme iron. Heme iron is found in meat, poultry, and fish. Nonheme iron. Nonheme iron is found in dietary supplements, iron-fortified grains, beans, and vegetables. You may need to follow an iron-rich diet if: You have been diagnosed with iron deficiency or iron-deficiency anemia. You have a condition that prevents you from absorbing dietary iron, such as: Infection in your intestines. Celiac disease. This involves long-lasting (chronic) inflammation of your intestines. You do not eat enough iron. You eat a diet that is high in foods that impair iron absorption. You have lost a lot of blood. You have heavy bleeding during your menstrual cycle. You are pregnant. WHAT IS MY PLAN? Your health care provider may help you to determine how much iron you need per day based on your condition. Generally, when a person consumes sufficient amounts of iron in the diet, the following iron needs are met: Men. 16-78 years old: 11 mg per day. 29-66 years old: 8 mg per day. Women.  83-73 years old: 15 mg per day. 65-38 years old: 18 mg per day. Over 76 years old: 8 mg per day. Pregnant women: 27 mg per day. Breastfeeding women: 9 mg per day. WHAT DO I NEED TO KNOW ABOUT AN IRON-RICH DIET? Eat fresh fruits and vegetables that are high in vitamin C along with foods that are high in iron. This will help increase the amount of iron that your body absorbs  from food, especially with foods containing nonheme iron. Foods that are high in vitamin C include oranges, peppers, tomatoes, and mango. Take iron supplements only as directed by your health care provider. Overdose of iron can be life-threatening. If you were prescribed iron supplements, take them with orange juice or a vitamin C supplement. Cook foods in pots and pans that are made from iron.  Eat nonheme iron-containing foods alongside foods that are high in heme iron. This helps to improve your iron absorption.  Certain foods and drinks contain compounds that impair iron absorption. Avoid eating these foods in the same meal as iron-rich foods or with iron supplements. These include: Coffee, black tea, and red wine. Milk, dairy products, and foods that are high in calcium. Beans, soybeans, and peas. Whole grains. When eating foods that contain both nonheme iron and compounds that impair iron absorption, follow these tips to absorb iron better.  Soak beans overnight before cooking. Soak whole grains overnight and drain them before using. Ferment flours before baking, such as using yeast in bread dough. WHAT FOODS CAN I EAT? Grains Iron-fortified breakfast cereal. Iron-fortified whole-wheat bread. Enriched rice. Sprouted grains. Vegetables Spinach. Potatoes with skin. Green peas. Broccoli. Red and green bell peppers. Fermented vegetables. Fruits Prunes. Raisins. Oranges. Strawberries. Mango. Grapefruit. Meats and Other Protein Sources Beef liver. Oysters. Beef. Shrimp. [place]. Chicken. [place]. Sardines. Chickpeas. Nuts. Tofu. Beverages Tomato juice. Fresh orange juice. Prune juice. Hibiscus tea. Fortified instant breakfast shakes. Condiments Tahini. Fermented soy sauce. Sweets  and Desserts Black-strap molasses.  Other Wheat germ. The items listed above may not be a complete list of recommended foods or beverages. Contact your dietitian for more options. WHAT FOODS ARE NOT  RECOMMENDED? Grains Whole grains. Bran cereal. Bran flour. Oats. Vegetables Artichokes. Brussels sprouts. Kale. Fruits Blueberries. Raspberries. Strawberries. Figs. Meats and Other Protein Sources Soybeans. Products made from soy protein. Dairy Milk. Cream. Cheese. Yogurt. Cottage cheese. Beverages Coffee. Black tea. Red wine. Sweets and Desserts Cocoa. Chocolate. Ice cream. Other Basil. Oregano. Parsley. The items listed above may not be a complete list of foods and beverages to avoid. Contact your dietitian for more information.   This information is not intended to replace advice given to you by your health care provider. Make sure you discuss any questions you have with your health care provider.   Document Released: 06/18/2005 Document Revised: 11/25/2014 Document Reviewed: 06/01/2014 Elsevier Interactive Patient Education 2016 [employer]. Anemia, Nonspecific Anemia is a condition in which the concentration of red blood cells or hemoglobin in the blood is below normal. Hemoglobin is a substance in red blood cells that carries oxygen to the tissues of the body. Anemia results in not enough oxygen reaching these tissues.  CAUSES  Common causes of anemia include:  Excessive bleeding. Bleeding may be internal or external. This includes excessive bleeding from periods (in women) or from the intestine.  Poor nutrition.  Chronic kidney, thyroid, and liver disease. Bone marrow disorders that decrease red blood cell production. Cancer and treatments for cancer. HIV, AIDS, and their treatments. Spleen problems that increase red blood cell destruction. Blood disorders. Excess destruction of red blood cells due to infection, medicines, and autoimmune disorders. SIGNS AND SYMPTOMS  Minor weakness.  Dizziness.  Headache. Palpitations.  Shortness of breath, especially with exercise.  Paleness. Cold sensitivity. Indigestion. Nausea. Difficulty  sleeping. Difficulty concentrating. Symptoms may occur suddenly or they may develop slowly.  DIAGNOSIS  Additional blood tests are often needed. These help your health care provider determine the best treatment. Your health care provider will check your stool for blood and look for other causes of blood loss.  TREATMENT  Treatment varies depending on the cause of the anemia. Treatment can include:  Supplements of iron, vitamin F12, or folic acid.  Hormone medicines.  A blood transfusion. This may be needed if blood loss is severe.  Hospitalization. This may be needed if there is significant continual blood loss.  Dietary changes. Spleen removal. HOME CARE INSTRUCTIONS Keep all follow-up appointments. It often takes many weeks to correct anemia, and having your health care provider check on your condition and your response to treatment is very important. SEEK IMMEDIATE MEDICAL CARE IF:  You develop extreme weakness, shortness of breath, or chest pain.  You become dizzy or have trouble concentrating. You develop heavy vaginal bleeding.  You develop a rash.  You have bloody or black, tarry stools.  You faint.  You vomit up blood.  You vomit repeatedly.  You have abdominal pain. You have a fever or persistent symptoms for more than 2-3 days.  You have a fever and your symptoms suddenly get worse.  You are dehydrated.  MAKE SURE YOU: Understand these instructions. Will watch your condition. Will get help right away if you are not doing well or get worse.   This information is not intended to replace advice given to you by your health care provider. Make sure you discuss any questions you have with your health care provider.   Document Released:  12/12/2004 Document Revised: 07/07/2013 Document Reviewed: 04/30/2013 Elsevier Interactive Patient Education [employer]. Contraception Choices Contraception (birth control) is the use of any methods or devices to prevent  pregnancy. Below are some methods to help avoid pregnancy. HORMONAL METHODS   Contraceptive implant. This is a thin, plastic tube containing progesterone hormone. It does not contain estrogen hormone. Your health care provider inserts the tube in the inner part of the upper arm. The tube can remain in place for up to 3 years. After 3 years, the implant must be removed. The implant prevents the ovaries from releasing an egg (ovulation), thickens the cervical mucus to prevent sperm from entering the uterus, and thins the lining of the inside of the uterus.  Progesterone-only injections. These injections are given every 3 months by your health care provider to prevent pregnancy. This synthetic progesterone hormone stops the ovaries from releasing eggs. It also thickens cervical mucus and changes the uterine lining. This makes it harder for sperm to survive in the uterus.  Birth control pills. These pills contain estrogen and progesterone hormone. They work by preventing the ovaries from releasing eggs (ovulation). They also cause the cervical mucus to thicken, preventing the sperm from entering the uterus. Birth control pills are prescribed by a health care provider.Birth control pills can also be used to treat heavy periods.  Minipill. This type of birth control pill contains only the progesterone hormone. They are taken every day of each month and must be prescribed by your health care provider.  Birth control patch. The patch contains hormones similar to those in birth control pills. It must be changed once a week and is prescribed by a health care provider.  Vaginal ring. The ring contains hormones similar to those in birth control pills. It is left in the vagina for 3 weeks, removed for 1 week, and then a new one is put back in place. The patient must be comfortable inserting and removing the ring from the vagina.A health care provider's prescription is necessary.  Emergency contraception.  Emergency contraceptives prevent pregnancy after unprotected sexual intercourse. This pill can be taken right after sex or up to 5 days after unprotected sex. It is most effective the sooner you take the pills after having sexual intercourse. Most emergency contraceptive pills are available without a prescription. Check with your pharmacist. Do not use emergency contraception as your only form of birth control. BARRIER METHODS   Female condom. This is a thin sheath (latex or rubber) that is worn over the penis during sexual intercourse. It can be used with spermicide to increase effectiveness.  Female condom. This is a soft, loose-fitting sheath that is put into the vagina before sexual intercourse.  Diaphragm. This is a soft, latex, dome-shaped barrier that must be fitted by a health care provider. It is inserted into the vagina, along with a spermicidal jelly. It is inserted before intercourse. The diaphragm should be left in the vagina for 6 to 8 hours after intercourse.  Cervical cap. This is a round, soft, latex or plastic cup that fits over the cervix and must be fitted by a health care provider. The cap can be left in place for up to 48 hours after intercourse.  Sponge. This is a soft, circular piece of polyurethane foam. The sponge has spermicide in it. It is inserted into the vagina after wetting it and before sexual intercourse.  Spermicides. These are chemicals that kill or block sperm from entering the cervix and uterus. They come  in the form of creams, jellies, suppositories, foam, or tablets. They do not require a prescription. They are inserted into the vagina with an applicator before having sexual intercourse. The process must be repeated every time you have sexual intercourse. INTRAUTERINE CONTRACEPTION  Intrauterine device (IUD). This is a T-shaped device that is put in a woman's uterus during a menstrual period to prevent pregnancy. There are 2 types:  Copper IUD. This type of IUD  is wrapped in copper wire and is placed inside the uterus. Copper makes the uterus and fallopian tubes produce a fluid that kills sperm. It can stay in place for 10 years.  Hormone IUD. This type of IUD contains the hormone progestin (synthetic progesterone). The hormone thickens the cervical mucus and prevents sperm from entering the uterus, and it also thins the uterine lining to prevent implantation of a fertilized egg. The hormone can weaken or kill the sperm that get into the uterus. It can stay in place for 3-5 years, depending on which type of IUD is used. PERMANENT METHODS OF CONTRACEPTION  Female tubal ligation. This is when the woman's fallopian tubes are surgically sealed, tied, or blocked to prevent the egg from traveling to the uterus.  Hysteroscopic sterilization. This involves placing a small coil or insert into each fallopian tube. Your doctor uses a technique called hysteroscopy to do the procedure. The device causes scar tissue to form. This results in permanent blockage of the fallopian tubes, so the sperm cannot fertilize the egg. It takes about 3 months after the procedure for the tubes to become blocked. You must use another form of birth control for these 3 months.  Female sterilization. This is when the female has the tubes that carry sperm tied off (vasectomy).This blocks sperm from entering the vagina during sexual intercourse. After the procedure, the man can still ejaculate fluid (semen). NATURAL PLANNING METHODS  Natural family planning. This is not having sexual intercourse or using a barrier method (condom, diaphragm, cervical cap) on days the woman could become pregnant.  Calendar method. This is keeping track of the length of each menstrual cycle and identifying when you are fertile.  Ovulation method. This is avoiding sexual intercourse during ovulation.  Symptothermal method. This is avoiding sexual intercourse during ovulation, using a thermometer and ovulation  symptoms.  Post-ovulation method. This is timing sexual intercourse after you have ovulated. Regardless of which type or method of contraception you choose, it is important that you use condoms to protect against the transmission of sexually transmitted infections (STIs). Talk with your health care provider about which form of contraception is most appropriate for you.   This information is not intended to replace advice given to you by your health care provider. Make sure you discuss any questions you have with your health care provider.   Document Released: 11/04/2005 Document Revised: 11/09/2013 Document Reviewed: 04/29/2013 Elsevier Interactive Patient Education 2016 [employer]. Postpartum Depression and Baby Blues The postpartum period begins right after the birth of a baby. During this time, there is often a great amount of joy and excitement. It is also a time of many changes in the life of the parents. Regardless of how many times a mother gives birth, each child brings new challenges and dynamics to the family. It is not unusual to have feelings of excitement along with confusing shifts in moods, emotions, and thoughts. All mothers are at risk of developing postpartum depression or the "baby blues." These mood changes can occur right after giving  birth, or they may occur many months after giving birth. The baby blues or postpartum depression can be mild or severe. Additionally, postpartum depression can go away rather quickly, or it can be a long-term condition.  CAUSES Raised hormone levels and the rapid drop in those levels are thought to be a main cause of postpartum depression and the baby blues. A number of hormones change during and after pregnancy. Estrogen and progesterone usually decrease right after the delivery of your baby. The levels of thyroid hormone and various cortisol steroids also rapidly drop. Other factors that play a role in these mood changes include major life events  and genetics.  RISK FACTORS If you have any of the following risks for the baby blues or postpartum depression, know what symptoms to watch out for during the postpartum period. Risk factors that may increase the likelihood of getting the baby blues or postpartum depression include:  Having a personal or family history of depression.   Having depression while being pregnant.   Having premenstrual mood issues or mood issues related to oral contraceptives.  Having a lot of life stress.   Having marital conflict.   Lacking a social support network.   Having a baby with special needs.   Having health problems, such as diabetes.  SIGNS AND SYMPTOMS Symptoms of baby blues include:  Brief changes in mood, such as going from extreme happiness to sadness.  Decreased concentration.   Difficulty sleeping.   Crying spells, tearfulness.   Irritability.   Anxiety.  Symptoms of postpartum depression typically begin within the first month after giving birth. These symptoms include:  Difficulty sleeping or excessive sleepiness.   Marked weight loss.   Agitation.   Feelings of worthlessness.   Lack of interest in activity or food.  Postpartum psychosis is a very serious condition and can be dangerous. Fortunately, it is rare. Displaying any of the following symptoms is cause for immediate medical attention. Symptoms of postpartum psychosis include:   Hallucinations and delusions.   Bizarre or disorganized behavior.   Confusion or disorientation.  DIAGNOSIS  A diagnosis is made by an evaluation of your symptoms. There are no medical or lab tests that lead to a diagnosis, but there are various questionnaires that a health care provider may use to identify those with the baby blues, postpartum depression, or psychosis. Often, a screening tool called the [place] Postnatal Depression Scale is used to diagnose depression in the postpartum period.  TREATMENT The  baby blues usually goes away on its own in 1-2 weeks. Social support is often all that is needed. You will be encouraged to get adequate sleep and rest. Occasionally, you may be given medicines to help you sleep.  Postpartum depression requires treatment because it can last several months or longer if it is not treated. Treatment may include individual or group therapy, medicine, or both to address any social, physiological, and psychological factors that may play a role in the depression. Regular exercise, a healthy diet, rest, and social support may also be strongly recommended.  Postpartum psychosis is more serious and needs treatment right away. Hospitalization is often needed. HOME CARE INSTRUCTIONS  Get as much rest as you can. Nap when the baby sleeps.   Exercise regularly. Some women find yoga and walking to be beneficial.   Eat a balanced and nourishing diet.   Do little things that you enjoy. Have a cup of tea, take a bubble bath, read your favorite magazine, or listen to your  favorite music.  Avoid alcohol.   Ask for help with household chores, cooking, grocery shopping, or running errands as needed. Do not try to do everything.   Talk to people close to you about how you are feeling. Get support from your partner, family members, friends, or other new moms.  Try to stay positive in how you think. Think about the things you are grateful for.   Do not spend a lot of time alone.   Only take over-the-counter or prescription medicine as directed by your health care provider.  Keep all your postpartum appointments.   Let your health care provider know if you have any concerns.  SEEK MEDICAL CARE IF: You are having a reaction to or problems with your medicine. SEEK IMMEDIATE MEDICAL CARE IF:  You have suicidal feelings.   You think you may harm the baby or someone else. MAKE SURE YOU:  Understand these instructions.  Will watch your condition.  Will get help  right away if you are not doing well or get worse.   This information is not intended to replace advice given to you by your health care provider. Make sure you discuss any questions you have with your health care provider.   Document Released: 08/08/2004 Document Revised: 11/09/2013 Document Reviewed: 08/16/2013 Elsevier Interactive Patient Education 2016 [employer] Breastfeeding and Mastitis Mastitis is inflammation of the breast tissue. It can occur in women who are breastfeeding. This can make breastfeeding painful. Mastitis will sometimes go away on its own. Your health care provider will help determine if treatment is needed. CAUSES Mastitis is often associated with a blocked milk (lactiferous) duct. This can happen when too much milk builds up in the breast. Causes of excess milk in the breast can include:  Poor latch-on. If your baby is not latched onto the breast properly, she or he may not empty your breast completely while breastfeeding.  Allowing too much time to pass between feedings.  Wearing a bra or other clothing that is too tight. This puts extra pressure on the lactiferous ducts so milk does not flow through them as it should. Mastitis can also be caused by a bacterial infection. Bacteria may enter the breast tissue through cuts or openings in the skin. In women who are breastfeeding, this may occur because of cracked or irritated skin. Cracks in the skin are often caused when your baby does not latch on properly to the breast. SIGNS AND SYMPTOMS  Swelling, redness, tenderness, and pain in an area of the breast.  Swelling of the glands under the arm on the same side.  Fever may or may not accompany mastitis. If an infection is allowed to progress, a collection of pus (abscess) may develop. DIAGNOSIS  Your health care provider can usually diagnose mastitis based on your symptoms and a physical exam. Tests may be done to help confirm the diagnosis. These may  include:  Removal of pus from the breast by applying pressure to the area. This pus can be examined in the lab to determine which bacteria are present. If an abscess has developed, the fluid in the abscess can be removed with a needle. This can also be used to confirm the diagnosis and determine the bacteria present. In most cases, pus will not be present.  Blood tests to determine if your body is fighting a bacterial infection.  Mammogram or ultrasound tests to rule out other problems or diseases. TREATMENT  Mastitis that occurs with breastfeeding will sometimes go away on  its own. Your health care provider may choose to wait 24 hours after first seeing you to decide whether a prescription medicine is needed. If your symptoms are worse after 24 hours, your health care provider will likely prescribe an antibiotic medicine to treat the mastitis. He or she will determine which bacteria are most likely causing the infection and will then select an appropriate antibiotic medicine. This is sometimes changed based on the results of tests performed to identify the bacteria, or if there is no response to the antibiotic medicine selected. Antibiotic medicines are usually given by mouth. You may also be given medicine for pain. HOME CARE INSTRUCTIONS  Only take over-the-counter or prescription medicines for pain, fever, or discomfort as directed by your health care provider.  If your health care provider prescribed an antibiotic medicine, take the medicine as directed. Make sure you finish it even if you start to feel better.  Do not wear a tight or underwire bra. Wear a soft, supportive bra.  Increase your fluid intake, especially if you have a fever.  Continue to empty the breast. Your health care provider can tell you whether this milk is safe for your infant or needs to be thrown out. You may be told to stop nursing until your health care provider thinks it is safe for your baby. Use a breast pump if  you are advised to stop nursing.  Keep your nipples clean and dry.  Empty the first breast completely before going to the other breast. If your baby is not emptying your breasts completely for some reason, use a breast pump to empty your breasts.  If you go back to work, pump your breasts while at work to stay in time with your nursing schedule.  Avoid allowing your breasts to become overly filled with milk (engorged). SEEK MEDICAL CARE IF:  You have pus-like discharge from the breast.  Your symptoms do not improve with the treatment prescribed by your health care provider within 2 days. SEEK IMMEDIATE MEDICAL CARE IF:  Your pain and swelling are getting worse.  You have pain that is not controlled with medicine.  You have a red line extending from the breast toward your armpit.  You have a fever or persistent symptoms for more than 2-3 days.  You have a fever and your symptoms suddenly get worse. MAKE SURE YOU:   Understand these instructions.  Will watch your condition.  Will get help right away if you are not doing well or get worse.   This information is not intended to replace advice given to you by your health care provider. Make sure you discuss any questions you have with your health care provider.   Document Released: 03/01/2005 Document Revised: 11/09/2013 Document Reviewed: 06/10/2013 Elsevier Interactive Patient Education [employer]. Breastfeeding Deciding to breastfeed is one of the best choices you can make for you and your baby. A change in hormones during pregnancy causes your breast tissue to grow and increases the number and size of your milk ducts. These hormones also allow proteins, sugars, and fats from your blood supply to make breast milk in your milk-producing glands. Hormones prevent breast milk from being released before your baby is born as well as prompt milk flow after birth. Once breastfeeding has begun, thoughts of your baby, as well as his  or her sucking or crying, can stimulate the release of milk from your milk-producing glands.  BENEFITS OF BREASTFEEDING For Your Baby  Your first milk (colostrum) helps  your baby's digestive system function better.  There are antibodies in your milk that help your baby fight off infections.  Your baby has a lower incidence of asthma, allergies, and sudden infant death syndrome.  The nutrients in breast milk are better for your baby than infant formulas and are designed uniquely for your baby's needs.  Breast milk improves your baby's brain development.  Your baby is less likely to develop other conditions, such as childhood obesity, asthma, or type 2 diabetes mellitus. For You  Breastfeeding helps to create a very special bond between you and your baby.  Breastfeeding is convenient. Breast milk is always available at the correct temperature and costs nothing.  Breastfeeding helps to burn calories and helps you lose the weight gained during pregnancy.  Breastfeeding makes your uterus contract to its prepregnancy size faster and slows bleeding (lochia) after you give birth.   Breastfeeding helps to lower your risk of developing type 2 diabetes mellitus, osteoporosis, and breast or ovarian cancer later in life. SIGNS THAT YOUR BABY IS HUNGRY Early Signs of Hunger  Increased alertness or activity.  Stretching.  Movement of the head from side to side.  Movement of the head and opening of the mouth when the corner of the mouth or cheek is stroked (rooting).  Increased sucking sounds, smacking lips, cooing, sighing, or squeaking.  Hand-to-mouth movements.  Increased sucking of fingers or hands. Late Signs of Hunger  Fussing.  Intermittent crying. Extreme Signs of Hunger Signs of extreme hunger will require calming and consoling before your baby will be able to breastfeed successfully. Do not wait for the following signs of extreme hunger to occur before you initiate  breastfeeding:  Restlessness.  A loud, strong cry.  Screaming. BREASTFEEDING BASICS Breastfeeding Initiation  Find a comfortable place to sit or lie down, with your neck and back well supported.  Place a pillow or rolled up blanket under your baby to bring him or her to the level of your breast (if you are seated). Nursing pillows are specially designed to help support your arms and your baby while you breastfeed.  Make sure that your baby's abdomen is facing your abdomen.  Gently massage your breast. With your fingertips, massage from your chest wall toward your nipple in a circular motion. This encourages milk flow. You may need to continue this action during the feeding if your milk flows slowly.  Support your breast with 4 fingers underneath and your thumb above your nipple. Make sure your fingers are well away from your nipple and your baby's mouth.  Stroke your baby's lips gently with your finger or nipple.  When your baby's mouth is open wide enough, quickly bring your baby to your breast, placing your entire nipple and as much of the colored area around your nipple (areola) as possible into your baby's mouth.  More areola should be visible above your baby's upper lip than below the lower lip.  Your baby's tongue should be between his or her lower gum and your breast.  Ensure that your baby's mouth is correctly positioned around your nipple (latched). Your baby's lips should create a seal on your breast and be turned out (everted).  It is common for your baby to suck about 2-3 minutes in order to start the flow of breast milk. Latching Teaching your baby how to latch on to your breast properly is very important. An improper latch can cause nipple pain and decreased milk supply for you and poor weight gain in  your baby. Also, if your baby is not latched onto your nipple properly, he or she may swallow some air during feeding. This can make your baby fussy. Burping your baby when  you switch breasts during the feeding can help to get rid of the air. However, teaching your baby to latch on properly is still the best way to prevent fussiness from swallowing air while breastfeeding. Signs that your baby has successfully latched on to your nipple:  Silent tugging or silent sucking, without causing you pain.  Swallowing heard between every 3-4 sucks.  Muscle movement above and in front of his or her ears while sucking. Signs that your baby has not successfully latched on to nipple:  Sucking sounds or smacking sounds from your baby while breastfeeding.  Nipple pain. If you think your baby has not latched on correctly, slip your finger into the corner of your baby's mouth to break the suction and place it between your baby's gums. Attempt breastfeeding initiation again. Signs of Successful Breastfeeding Signs from your baby:  A gradual decrease in the number of sucks or complete cessation of sucking.  Falling asleep.  Relaxation of his or her body.  Retention of a small amount of milk in his or her mouth.  Letting go of your breast by himself or herself. Signs from you:  Breasts that have increased in firmness, weight, and size 1-3 hours after feeding.  Breasts that are softer immediately after breastfeeding.  Increased milk volume, as well as a change in milk consistency and color by the fifth day of breastfeeding.  Nipples that are not sore, cracked, or bleeding. Signs That Your [employer] is Getting Enough Milk  Wetting at least 3 diapers in a 24-hour period. The urine should be clear and pale yellow by age 720 days.  At least 3 stools in a 24-hour period by age 720 days. The stool should be soft and yellow.  At least 3 stools in a 24-hour period by age 72 days. The stool should be seedy and yellow.  No loss of weight greater than 10% of birth weight during the first 79 days of age.  Average weight gain of 4-7 ounces (113-198 g) per week after age 30  days.  Consistent daily weight gain by age 61 days, without weight loss after the age of 2 weeks. After a feeding, your baby may spit up a small amount. This is common. BREASTFEEDING FREQUENCY AND DURATION Frequent feeding will help you make more milk and can prevent sore nipples and breast engorgement. Breastfeed when you feel the need to reduce the fullness of your breasts or when your baby shows signs of hunger. This is called "breastfeeding on demand." Avoid introducing a pacifier to your baby while you are working to establish breastfeeding (the first 4-6 weeks after your baby is born). After this time you may choose to use a pacifier. Research has shown that pacifier use during the first year of a baby's life decreases the risk of sudden infant death syndrome (SIDS). Allow your baby to feed on each breast as long as he or she wants. Breastfeed until your baby is finished feeding. When your baby unlatches or falls asleep while feeding from the first breast, offer the second breast. Because newborns are often sleepy in the first few weeks of life, you may need to awaken your baby to get him or her to feed. Breastfeeding times will vary from baby to baby. However, the following rules can serve as a guide  to help you ensure that your baby is properly fed:  Newborns (babies 77 weeks of age or younger) may breastfeed every 1-3 hours.  Newborns should not go longer than 3 hours during the day or 5 hours during the night without breastfeeding.  You should breastfeed your baby a minimum of 8 times in a 24-hour period until you begin to introduce solid foods to your baby at around 50 months of age. BREAST MILK PUMPING Pumping and storing breast milk allows you to ensure that your baby is exclusively fed your breast milk, even at times when you are unable to breastfeed. This is especially important if you are going back to work while you are still breastfeeding or when you are not able to be present during  feedings. Your lactation consultant can give you guidelines on how long it is safe to store breast milk. A breast pump is a machine that allows you to pump milk from your breast into a sterile bottle. The pumped breast milk can then be stored in a refrigerator or freezer. Some breast pumps are operated by hand, while others use electricity. Ask your lactation consultant which type will work best for you. Breast pumps can be purchased, but some hospitals and breastfeeding support groups lease breast pumps on a monthly basis. A lactation consultant can teach you how to hand express breast milk, if you prefer not to use a pump. CARING FOR YOUR BREASTS WHILE YOU BREASTFEED Nipples can become dry, cracked, and sore while breastfeeding. The following recommendations can help keep your breasts moisturized and healthy:  Avoid using soap on your nipples.  Wear a supportive bra. Although not required, special nursing bras and tank tops are designed to allow access to your breasts for breastfeeding without taking off your entire bra or top. Avoid wearing underwire-style bras or extremely tight bras.  Air dry your nipples for 3-60mnutes after each feeding.  Use only cotton bra pads to absorb leaked breast milk. Leaking of breast milk between feedings is normal.  Use lanolin on your nipples after breastfeeding. Lanolin helps to maintain your skin's normal moisture barrier. If you use pure lanolin, you do not need to wash it off before feeding your baby again. Pure lanolin is not toxic to your baby. You may also hand express a few drops of breast milk and gently massage that milk into your nipples and allow the milk to air dry. In the first few weeks after giving birth, some women experience extremely full breasts (engorgement). Engorgement can make your breasts feel heavy, warm, and tender to the touch. Engorgement peaks within 3-5 days after you give birth. The following recommendations can help ease  engorgement:  Completely empty your breasts while breastfeeding or pumping. You may want to start by applying warm, moist heat (in the shower or with warm water-soaked hand towels) just before feeding or pumping. This increases circulation and helps the milk flow. If your baby does not completely empty your breasts while breastfeeding, pump any extra milk after he or she is finished.  Wear a snug bra (nursing or regular) or tank top for 1-2 days to signal your body to slightly decrease milk production.  Apply ice packs to your breasts, unless this is too uncomfortable for you.  Make sure that your baby is latched on and positioned properly while breastfeeding. If engorgement persists after 48 hours of following these recommendations, contact your health care provider or a lScience writer OVERALL HEALTH CARE RECOMMENDATIONS WHILE BREASTFEEDING  Eat  healthy foods. Alternate between meals and snacks, eating 3 of each per day. Because what you eat affects your breast milk, some of the foods may make your baby more irritable than usual. Avoid eating these foods if you are sure that they are negatively affecting your baby.  Drink milk, fruit juice, and water to satisfy your thirst (about 10 glasses a day).  Rest often, relax, and continue to take your prenatal vitamins to prevent fatigue, stress, and anemia.  Continue breast self-awareness checks.  Avoid chewing and smoking tobacco. Chemicals from cigarettes that pass into breast milk and exposure to secondhand smoke may harm your baby.  Avoid alcohol and drug use, including marijuana. Some medicines that may be harmful to your baby can pass through breast milk. It is important to ask your health care provider before taking any medicine, including all over-the-counter and prescription medicine as well as vitamin and herbal supplements. It is possible to become pregnant while breastfeeding. If birth control is desired, ask your health care  provider about options that will be safe for your baby. SEEK MEDICAL CARE IF:  You feel like you want to stop breastfeeding or have become frustrated with breastfeeding.  You have painful breasts or nipples.  Your nipples are cracked or bleeding.  Your breasts are red, tender, or warm.  You have a swollen area on either breast.  You have a fever or chills.  You have nausea or vomiting.  You have drainage other than breast milk from your nipples.  Your breasts do not become full before feedings by the fifth day after you give birth.  You feel sad and depressed.  Your baby is too sleepy to eat well.  Your baby is having trouble sleeping.   Your baby is wetting less than 3 diapers in a 24-hour period.  Your baby has less than 3 stools in a 24-hour period.  Your baby's skin or the white part of his or her eyes becomes yellow.   Your baby is not gaining weight by 87 days of age. SEEK IMMEDIATE MEDICAL CARE IF:  Your baby is overly tired (lethargic) and does not want to wake up and feed.  Your baby develops an unexplained fever.   This information is not intended to replace advice given to you by your health care provider. Make sure you discuss any questions you have with your health care provider.   Document Released: 11/04/2005 Document Revised: 07/26/2015 Document Reviewed: 04/28/2013 Elsevier Interactive Patient Education 2016 [employer]. Postpartum Care After Cesarean Delivery After you deliver your newborn (postpartum period), the usual stay in the hospital is 24-72 hours. If there were problems with your labor or delivery, or if you have other medical problems, you might be in the hospital longer.  While you are in the hospital, you will receive help and instructions on how to care for yourself and your newborn during the postpartum period.  While you are in the hospital:  It is normal for you to have pain or discomfort from the incision in your abdomen. Be sure  to tell your nurses when you are having pain, where the pain is located, and what makes the pain worse.  If you are breastfeeding, you may feel uncomfortable contractions of your uterus for a couple of weeks. This is normal. The contractions help your uterus get back to normal size.  It is normal to have some bleeding after delivery.  For the first 1-3 days after delivery, the flow is red and  the amount may be similar to a period.  It is common for the flow to start and stop.  In the first few days, you may pass some small clots. Let your nurses know if you begin to pass large clots or your flow increases.  Do not  flush blood clots down the toilet before having the nurse look at them.  During the next 3-10 days after delivery, your flow should become more watery and pink or brown-tinged in color.  Ten to fourteen days after delivery, your flow should be a small amount of yellowish-white discharge.  The amount of your flow will decrease over the first few weeks after delivery. Your flow may stop in 6-8 weeks. Most women have had their flow stop by 12 weeks after delivery.  You should change your sanitary pads frequently.  Wash your hands thoroughly with soap and water for at least 20 seconds after changing pads, using the toilet, or before holding or feeding your newborn.  Your intravenous (IV) tubing will be removed when you are drinking enough fluids.  The urine drainage tube (urinary catheter) that was inserted before delivery may be removed within 6-8 hours after delivery or when feeling returns to your legs. You should feel like you need to empty your bladder within the first 6-8 hours after the catheter has been removed.  In case you become weak, lightheaded, or faint, call your nurse before you get out of bed for the first time and before you take a shower for the first time.  Within the first few days after delivery, your breasts may begin to feel tender and full. This is called  engorgement. Breast tenderness usually goes away within 48-72 hours after engorgement occurs. You may also notice milk leaking from your breasts. If you are not breastfeeding, do not stimulate your breasts. Breast stimulation can make your breasts produce more milk.  Spending as much time as possible with your newborn is very important. During this time, you and your newborn can feel close and get to know each other. Having your newborn stay in your room (rooming in) will help to strengthen the bond with your newborn. It will give you time to get to know your newborn and become comfortable caring for your newborn.  Your hormones change after delivery. Sometimes the hormone changes can temporarily cause you to feel sad or tearful. These feelings should not last more than a few days. If these feelings last longer than that, you should talk to your caregiver.  If desired, talk to your caregiver about methods of family planning or contraception.  Talk to your caregiver about immunizations. Your caregiver may want you to have the following immunizations before leaving the hospital:  Tetanus, diphtheria, and pertussis (Tdap) or tetanus and diphtheria (Td) immunization. It is very important that you and your family (including grandparents) or others caring for your newborn are up-to-date with the Tdap or Td immunizations. The Tdap or Td immunization can help protect your newborn from getting ill.  Rubella immunization.  Varicella (chickenpox) immunization.  Influenza immunization. You should receive this annual immunization if you did not receive the immunization during your pregnancy.   This information is not intended to replace advice given to you by your health care provider. Make sure you discuss any questions you have with your health care provider.   Document Released: 07/29/2012 Document Reviewed: 07/29/2012 Elsevier Interactive Patient Education [employer].

## 2016-06-23 NOTE — Lactation Note (Signed)
This note was copied from a baby's chart. Lactation Consultation Note: Baby frantic when I went into room. Family member and RN assisting mom with latch. Mom needs much assist with getting baby positioned and to latch.. Used feeding tube /syringe and baby calmed latched well and nursed for 15 min, then getting sleepy. Took 20 ml of formula. Reviewed setup, use and cleaning of feeding tube/syringe with mom and family member. Mom insisting on going home today. Suggested staying another day to work on breast feeding and mom still insisting on going home. To see Ped tomorrow. OP appointment made with us on Friday 8/11 at 2:30 pm. Encouraged mom to pump after every feeding to stimulate milk supply. Encouraged to supplement with every feeding by either feeding tube/syringe or bottle. Has feeding amount guidelines. No questions at present. Encouraged to call with questions/concerns.    Patient Name: [name] [name] Lacretia Nicksara Nelson-Nerenberg ZOXWR'UToday's Date: 06/23/2016 Reason for consult: Follow-up assessment   Maternal Data Formula Feeding for Exclusion: No Has patient been taught Hand Expression?: Yes Does the patient have breastfeeding experience prior to this delivery?: No  Feeding Feeding Type: Formula Length of feed: 15 min  LATCH Score/Interventions Latch: Repeated attempts needed to sustain latch, nipple held in mouth throughout feeding, stimulation needed to elicit sucking reflex. Intervention(s): Skin to skin Intervention(s): Adjust position;Assist with latch  Audible Swallowing: A few with stimulation  Type of Nipple: Everted at rest and after stimulation  Comfort (Breast/Nipple): Soft / non-tender     Hold (Positioning): Full assist, staff holds infant at breast  LATCH Score: 6  Lactation Tools Discussed/Used Tools: 61F feeding tube / Syringe WIC Program: No   Consult Status Consult Status: Complete    Pamelia HoitWeeks, Jameer Storie D 06/23/2016, 3:00 PM

## 2016-06-28 ENCOUNTER — Ambulatory Visit (HOSPITAL_COMMUNITY): Payer: BLUE CROSS/BLUE SHIELD | Attending: Obstetrics and Gynecology

## 2016-09-17 ENCOUNTER — Other Ambulatory Visit: Payer: Self-pay | Admitting: Dermatology

## 2016-11-05 ENCOUNTER — Ambulatory Visit (INDEPENDENT_AMBULATORY_CARE_PROVIDER_SITE_OTHER): Payer: 59 | Admitting: Physician Assistant

## 2016-11-05 ENCOUNTER — Encounter: Payer: Self-pay | Admitting: Physician Assistant

## 2016-11-05 VITALS — BP 122/80 | HR 83 | Temp 97.5°F | Resp 16 | Ht 63.0 in | Wt 203.4 lb

## 2016-11-05 DIAGNOSIS — Z6830 Body mass index (BMI) 30.0-30.9, adult: Secondary | ICD-10-CM | POA: Diagnosis not present

## 2016-11-05 DIAGNOSIS — E559 Vitamin D deficiency, unspecified: Secondary | ICD-10-CM | POA: Diagnosis not present

## 2016-11-05 DIAGNOSIS — Z79899 Other long term (current) drug therapy: Secondary | ICD-10-CM

## 2016-11-05 DIAGNOSIS — Z0000 Encounter for general adult medical examination without abnormal findings: Secondary | ICD-10-CM

## 2016-11-05 DIAGNOSIS — Z0001 Encounter for general adult medical examination with abnormal findings: Secondary | ICD-10-CM

## 2016-11-05 DIAGNOSIS — E669 Obesity, unspecified: Secondary | ICD-10-CM

## 2016-11-05 DIAGNOSIS — Z13220 Encounter for screening for lipoid disorders: Secondary | ICD-10-CM

## 2016-11-05 DIAGNOSIS — Z131 Encounter for screening for diabetes mellitus: Secondary | ICD-10-CM

## 2016-11-05 DIAGNOSIS — R6889 Other general symptoms and signs: Secondary | ICD-10-CM | POA: Diagnosis not present

## 2016-11-05 DIAGNOSIS — M545 Low back pain, unspecified: Secondary | ICD-10-CM

## 2016-11-05 DIAGNOSIS — Z1389 Encounter for screening for other disorder: Secondary | ICD-10-CM

## 2016-11-05 DIAGNOSIS — R5383 Other fatigue: Secondary | ICD-10-CM | POA: Diagnosis not present

## 2016-11-05 DIAGNOSIS — Z1329 Encounter for screening for other suspected endocrine disorder: Secondary | ICD-10-CM

## 2016-11-05 LAB — CBC WITH DIFFERENTIAL/PLATELET
Basophils Absolute: 0 cells/uL (ref 0–200)
Basophils Relative: 0 %
Eosinophils Absolute: 184 cells/uL (ref 15–500)
Eosinophils Relative: 2 %
HCT: 44 % (ref 35.0–45.0)
Hemoglobin: 14.2 g/dL (ref 11.7–15.5)
Lymphocytes Relative: 29 %
Lymphs Abs: 2668 cells/uL (ref 850–3900)
MCH: 29.2 pg (ref 27.0–33.0)
MCHC: 32.3 g/dL (ref 32.0–36.0)
MCV: 90.5 fL (ref 80.0–100.0)
MPV: 9.7 fL (ref 7.5–12.5)
Monocytes Absolute: 736 cells/uL (ref 200–950)
Monocytes Relative: 8 %
Neutro Abs: 5612 cells/uL (ref 1500–7800)
Neutrophils Relative %: 61 %
Platelets: 362 10*3/uL (ref 140–400)
RBC: 4.86 MIL/uL (ref 3.80–5.10)
RDW: 13.7 % (ref 11.0–15.0)
WBC: 9.2 10*3/uL (ref 3.8–10.8)

## 2016-11-05 MED ORDER — IBUPROFEN 800 MG PO TABS: 800.0000 mg | ORAL_TABLET | Freq: Three times a day (TID) | ORAL | 1 refills | Status: DC

## 2016-11-05 MED ORDER — ESCITALOPRAM OXALATE 10 MG PO TABS: 10.0000 mg | ORAL_TABLET | Freq: Every day | ORAL | 2 refills | Status: DC

## 2016-11-05 NOTE — Patient Instructions (Addendum)
Start back on lexapro 10mg  Try the melatonin 5mg -20mg  dissolvable or gummy 30 mins before bed  Do heating pad Continue ibuprofen Stretches  [place]  Head to Feet Massage Suggest Pt If not better suggest Xray    Low Back Strain Rehab Ask your health care provider which exercises are safe for you. Do exercises exactly as told by your health care provider and adjust them as directed. It is normal to feel mild stretching, pulling, tightness, or discomfort as you do these exercises, but you should stop right away if you feel sudden pain or your pain gets worse. Do not begin these exercises until told by your health care provider. Stretching and range of motion exercises These exercises warm up your muscles and joints and improve the movement and flexibility of your back. These exercises also help to relieve pain, numbness, and tingling. Exercise A: Single knee to chest 1. Lie on your back on a firm surface with both legs straight. 2. Bend one of your knees. Use your hands to move your knee up toward your chest until you feel a gentle stretch in your lower back and buttock.  Hold your leg in this position by holding onto the front of your knee.  Keep your other leg as straight as possible. 3. Hold for __________ seconds. 4. Slowly return to the starting position. 5. Repeat with your other leg. Repeat __________ times. Complete this exercise __________ times a day. Exercise B: Prone extension on elbows 1. Lie on your abdomen on a firm surface. 2. Prop yourself up on your elbows. 3. Use your arms to help lift your chest up until you feel a gentle stretch in your abdomen and your lower back.  This will place some of your body weight on your elbows. If this is uncomfortable, try stacking pillows under your chest.  Your hips should stay down, against the surface that you are lying on. Keep your hip and back muscles relaxed. 4. Hold for __________ seconds. 5. Slowly relax your upper body  and return to the starting position. Repeat __________ times. Complete this exercise __________ times a day. Strengthening exercises These exercises build strength and endurance in your back. Endurance is the ability to use your muscles for a long time, even after they get tired. Exercise C: Pelvic tilt 1. Lie on your back on a firm surface. Bend your knees and keep your feet flat. 2. Tense your abdominal muscles. Tip your pelvis up toward the ceiling and flatten your lower back into the floor.  To help with this exercise, you may place a small towel under your lower back and try to push your back into the towel. 3. Hold for __________ seconds. 4. Let your muscles relax completely before you repeat this exercise. Repeat __________ times. Complete this exercise __________ times a day. Exercise D: Alternating arm and leg raises 1. Get on your hands and knees on a firm surface. If you are on a hard floor, you may want to use padding to cushion your knees, such as an exercise mat. 2. Line up your arms and legs. Your hands should be below your shoulders, and your knees should be below your hips. 3. Lift your left leg behind you. At the same time, raise your right arm and straighten it in front of you.  Do not lift your leg higher than your hip.  Do not lift your arm higher than your shoulder.  Keep your abdominal and back muscles tight.  Keep your hips  facing the ground.  Do not arch your back.  Keep your balance carefully, and do not hold your breath. 4. Hold for __________ seconds. 5. Slowly return to the starting position and repeat with your right leg and your left arm. Repeat __________ times. Complete this exercise __________times a day. Exercise J: Single leg lower with bent knees 1. Lie on your back on a firm surface. 2. Tense your abdominal muscles and lift your feet off the floor, one foot at a time, so your knees and hips are bent in an "L" shape (at about 90 degrees).  Your  knees should be over your hips and your lower legs should be parallel to the floor. 3. Keeping your abdominal muscles tense and your knee bent, slowly lower one of your legs so your toe touches the ground. 4. Lift your leg back up to return to the starting position.  Do not hold your breath.  Do not let your back arch. Keep your back flat against the ground. 5. Repeat with your other leg. Repeat __________ times. Complete this exercise __________ times a day. Posture and body mechanics   Body mechanics refers to the movements and positions of your body while you do your daily activities. Posture is part of body mechanics. Good posture and healthy body mechanics can help to relieve stress in your body's tissues and joints. Good posture means that your spine is in its natural S-curve position (your spine is neutral), your shoulders are pulled back slightly, and your head is not tipped forward. The following are general guidelines for applying improved posture and body mechanics to your everyday activities. Standing   When standing, keep your spine neutral and your feet about hip-width apart. Keep a slight bend in your knees. Your ears, shoulders, and hips should line up.  When you do a task in which you stand in one place for a long time, place one foot up on a stable object that is 2-4 inches (5-10 cm) high, such as a footstool. This helps keep your spine neutral. Sitting  When sitting, keep your spine neutral and keep your feet flat on the floor. Use a footrest, if necessary, and keep your thighs parallel to the floor. Avoid rounding your shoulders, and avoid tilting your head forward.  When working at a desk or a computer, keep your desk at a height where your hands are slightly lower than your elbows. Slide your chair under your desk so you are close enough to maintain good posture.  When working at a computer, place your monitor at a height where you are looking straight ahead and you do  not have to tilt your head forward or downward to look at the screen. Resting   When lying down and resting, avoid positions that are most painful for you.  If you have pain with activities such as sitting, bending, stooping, or squatting (flexion-based activities), lie in a position in which your body does not bend very much. For example, avoid curling up on your side with your arms and knees near your chest (fetal position).  If you have pain with activities such as standing for a long time or reaching with your arms (extension-based activities), lie with your spine in a neutral position and bend your knees slightly. Try the following positions:  Lying on your side with a pillow between your knees.  Lying on your back with a pillow under your knees. Lifting   When lifting objects, keep your feet at least  shoulder-width apart and tighten your abdominal muscles.  Bend your knees and hips and keep your spine neutral. It is important to lift using the strength of your legs, not your back. Do not lock your knees straight out.  Always ask for help to lift heavy or awkward objects. This information is not intended to replace advice given to you by your health care provider. Make sure you discuss any questions you have with your health care provider. Document Released: 11/04/2005 Document Revised: 07/11/2016 Document Reviewed: 08/16/2015 Elsevier Interactive Patient Education  2017 [employer].

## 2016-11-05 NOTE — Progress Notes (Signed)
Subjective:    Patient ID: [name] [name], female    DOB: November 15, 1981, 35 y.o.   MRN: [id number]  HPI 35 y.o. WF here to establish for PCP, has followed with GYN and neuro for migraines but no PCP.   She is 4 months s/p C section with owen, had spinal tap, having lower back pain since that time, improving. When she lays flat on the ground has gnawing pain. 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 Denies tingling, numbness, pain in her legs, no urinary issues.   Follows with Dr. Lawson FiscalLori for psych, she has been having insomnia with Cornelius Moraswen, some nights she will be able to fall asleep quickly but some nights she can not fall asleep or will wake up.  She is not breast feeding at this time.   Does not know her family history, does not know her dad's history and mom passed at age 37, feels have had thyroid issues for a long time, fatigue, weight issues, etc.   She has been on BCP, missed one dose and still having some break through bleeding, having heavier menses, lasting longer 5-7 days. Not sexually active at this time.  Her blood pressure has been controlled at home, today their BP is BP: 122/80  She does not workout. She denies chest pain, shortness of breath, dizziness.  She is not on cholesterol medication and denies myalgias. Her cholesterol is not at goal. The cholesterol last visit was:   Lab Results  Component Value Date   CHOL 143 10/25/2013   HDL 71 10/25/2013   LDLCALC 61 10/25/2013   TRIG 53 10/25/2013   CHOLHDL 2.0 10/25/2013   BMI is Body mass index is 36.03 kg/m., she is working on diet and exercise. Wt Readings from Last 3 Encounters:  11/05/16 203 lb 6.4 oz (92.3 kg)  06/19/16 232 lb (105.2 kg)  11/06/15 197 lb 12.8 oz (89.7 kg)    Allergies Allergies  Allergen Reactions  . Other Other (See Comments)    steroid eye drops-causes glaucoma     SURGICAL HISTORY She  has a past surgical history that includes Cholecystectomy,  laparoscopic (2004); Tonsillectomy and adenoidectomy (2002); Colposcopy (2002); and Cesarean section (N/A, 06/21/2016). FAMILY HISTORY Her family history includes Lung cancer in her paternal grandfather. SOCIAL HISTORY She  reports that she quit smoking about 13 years ago. She has never used smokeless tobacco. She reports that she drinks about 1.2 oz of alcohol per week . She reports that she does not use drugs.  Review of Systems  Constitutional: Positive for fatigue and unexpected weight change. Negative for activity change, appetite change, chills, diaphoresis and fever.  HENT: Negative.   Eyes: Negative.   Respiratory: Negative.   Cardiovascular: Negative.   Gastrointestinal: Negative.   Genitourinary: Negative.   Musculoskeletal: Positive for back pain and neck pain. Negative for arthralgias, gait problem, joint swelling, myalgias and neck stiffness.  Skin: Negative.  Negative for rash.  Neurological: Negative.   Hematological: Negative.   Psychiatric/Behavioral: Positive for sleep disturbance. Negative for confusion, decreased concentration, self-injury and suicidal ideas. The patient is nervous/anxious.        Objective:   Physical Exam  Constitutional: She is oriented to person, place, and time. She appears well-developed and well-nourished.  HENT:  Head: Normocephalic and atraumatic.  Right Ear: External ear normal.  Left Ear: External ear normal.  Mouth/Throat: Oropharynx is clear and moist.  Eyes: Conjunctivae and EOM are normal. Pupils are  equal, round, and reactive to light.  Neck: Normal range of motion. Neck supple. No thyromegaly present.  Cardiovascular: Normal rate, regular rhythm and normal heart sounds.  Exam reveals no gallop and no friction rub.   No murmur heard. Pulmonary/Chest: Effort normal and breath sounds normal. No respiratory distress. She has no wheezes.  Abdominal: Soft. Bowel sounds are normal. She exhibits no distension and no mass. There is no  tenderness. There is no rebound and no guarding.  Musculoskeletal: Normal range of motion. She exhibits tenderness (midline lower back tenderness, no SI tenderness, good strength, decrease reflexes but difficult exam, normal sensory bilateral legs).  Lymphadenopathy:    She has no cervical adenopathy.  Neurological: She is alert and oriented to person, place, and time. She displays normal reflexes. No cranial nerve deficit. Coordination normal.  Skin: Skin is warm and dry. No rash noted.  Psychiatric: She has a normal mood and affect.      Assessment & Plan:  1. Acute midline low back pain without sciatica Ibuprofen TID 800 for 2 weeks, stretches, heat, if not better will get xray since midline tenderness, declines PT/referral, if any new symptoms will call the office.   2. Class 1 obesity without serious comorbidity with body mass index (BMI) of 30.0 to 30.9 in adult, unspecified obesity type Increase fluids, check labs, will follow up 1 month, can discuss weight loss at that time in more depth.   3. Screening for diabetes mellitus - Hemoglobin A1c - Insulin, fasting  4. Screening cholesterol level - Lipid panel  5. Vitamin D deficiency - VITAMIN D 25 Hydroxy (Vit-D Deficiency, Fractures)  6. Screening for thyroid disorder - TSH - Thyroglobulin antibody - Thyroid peroxidase antibody  7. Medication management - CBC with Differential/Platelet - BASIC METABOLIC PANEL WITH GFR - Hepatic function panel - Magnesium  8. Screening for hematuria or proteinuria - Urinalysis, Routine w reflex microscopic - Microalbumin / creatinine urine ratio  9. Fatigue, unspecified type ? From anxiety/stress/lack of sleep- check labs- start back on lexapro, add melatonin - Iron and TIBC - Ferritin - Vitamin B12 - Thyroglobulin antibody - Thyroid peroxidase antibody  Future Appointments Date Time Provider Department Center  12/31/2016 9:30 AM Quentin MullingAmanda Jelicia Nantz, PA-C [id number] None

## 2016-11-06 ENCOUNTER — Encounter: Payer: Self-pay | Admitting: Physician Assistant

## 2016-11-06 DIAGNOSIS — E063 Autoimmune thyroiditis: Secondary | ICD-10-CM | POA: Insufficient documentation

## 2016-11-06 DIAGNOSIS — R768 Other specified abnormal immunological findings in serum: Secondary | ICD-10-CM

## 2016-11-06 LAB — URINALYSIS, ROUTINE W REFLEX MICROSCOPIC
Bilirubin Urine: NEGATIVE
Glucose, UA: NEGATIVE
Hgb urine dipstick: NEGATIVE
Ketones, ur: NEGATIVE
Nitrite: NEGATIVE
Protein, ur: NEGATIVE
Specific Gravity, Urine: 1.021 (ref 1.001–1.035)
pH: 7 (ref 5.0–8.0)

## 2016-11-06 LAB — THYROGLOBULIN ANTIBODY: Thyroglobulin Ab: <1 IU/mL (ref <2)

## 2016-11-06 LAB — HEPATIC FUNCTION PANEL
ALT: 12 U/L (ref 6–29)
AST: 14 U/L (ref 10–30)
Albumin: 4 g/dL (ref 3.6–5.1)
Alkaline Phosphatase: 94 U/L (ref 33–115)
Bilirubin, Direct: 0.1 mg/dL (ref <=0.2)
Indirect Bilirubin: 0.4 mg/dL (ref 0.2–1.2)
Total Bilirubin: 0.5 mg/dL (ref 0.2–1.2)
Total Protein: 7.1 g/dL (ref 6.1–8.1)

## 2016-11-06 LAB — URINALYSIS, MICROSCOPIC ONLY
Bacteria, UA: NONE SEEN [HPF]
Casts: NONE SEEN [LPF]
Crystals: NONE SEEN [HPF]
Yeast: NONE SEEN [HPF]

## 2016-11-06 LAB — BASIC METABOLIC PANEL WITH GFR
BUN: 11 mg/dL (ref 7–25)
CO2: 22 mmol/L (ref 20–31)
Calcium: 9.6 mg/dL (ref 8.6–10.2)
Chloride: 104 mmol/L (ref 98–110)
Creat: 0.83 mg/dL (ref 0.50–1.10)
GFR, Est African American: >89 mL/min (ref >=60)
GFR, Est Non African American: >89 mL/min (ref >=60)
Glucose, Bld: 99 mg/dL (ref 65–99)
Potassium: 4.4 mmol/L (ref 3.5–5.3)
Sodium: 139 mmol/L (ref 135–146)

## 2016-11-06 LAB — THYROID PEROXIDASE ANTIBODY: Thyroperoxidase Ab SerPl-aCnc: 139 IU/mL — ABNORMAL HIGH (ref <9)

## 2016-11-06 LAB — HEMOGLOBIN A1C
Hgb A1c MFr Bld: 5.3 % (ref <5.7)
Mean Plasma Glucose: 105 mg/dL

## 2016-11-06 LAB — VITAMIN B12: Vitamin B-12: 386 pg/mL (ref 200–1100)

## 2016-11-06 LAB — LIPID PANEL
Cholesterol: 144 mg/dL (ref <200)
HDL: 51 mg/dL (ref >50)
LDL Cholesterol: 66 mg/dL (ref <100)
Total CHOL/HDL Ratio: 2.8 Ratio (ref <5.0)
Triglycerides: 136 mg/dL (ref <150)
VLDL: 27 mg/dL (ref <30)

## 2016-11-06 LAB — FERRITIN: Ferritin: 27 ng/mL (ref 10–154)

## 2016-11-06 LAB — IRON AND TIBC
%SAT: 29 % (ref 11–50)
Iron: 80 ug/dL (ref 40–190)
TIBC: 279 ug/dL (ref 250–450)
UIBC: 199 ug/dL (ref 125–400)

## 2016-11-06 LAB — MAGNESIUM: Magnesium: 1.9 mg/dL (ref 1.5–2.5)

## 2016-11-06 LAB — MICROALBUMIN / CREATININE URINE RATIO
Creatinine, Urine: 154 mg/dL (ref 20–320)
Microalb Creat Ratio: 2 mcg/mg creat (ref <30)
Microalb, Ur: 0.3 mg/dL

## 2016-11-06 LAB — INSULIN, FASTING: Insulin fasting, serum: 9.5 u[IU]/mL (ref 2.0–19.6)

## 2016-11-06 LAB — VITAMIN D 25 HYDROXY (VIT D DEFICIENCY, FRACTURES): Vit D, 25-Hydroxy: 32 ng/mL (ref 30–100)

## 2016-11-06 LAB — TSH: TSH: 0.87 mIU/L

## 2016-12-20 ENCOUNTER — Encounter: Payer: Self-pay | Admitting: Certified Nurse Midwife

## 2016-12-20 ENCOUNTER — Ambulatory Visit (INDEPENDENT_AMBULATORY_CARE_PROVIDER_SITE_OTHER): Payer: 59 | Admitting: Certified Nurse Midwife

## 2016-12-20 VITALS — BP 120/80 | HR 70 | Resp 16 | Ht 62.75 in | Wt 201.0 lb

## 2016-12-20 DIAGNOSIS — Z0000 Encounter for general adult medical examination without abnormal findings: Secondary | ICD-10-CM | POA: Diagnosis not present

## 2016-12-20 DIAGNOSIS — Z124 Encounter for screening for malignant neoplasm of cervix: Secondary | ICD-10-CM

## 2016-12-20 DIAGNOSIS — Z30011 Encounter for initial prescription of contraceptive pills: Secondary | ICD-10-CM

## 2016-12-20 DIAGNOSIS — A6000 Herpesviral infection of urogenital system, unspecified: Secondary | ICD-10-CM

## 2016-12-20 DIAGNOSIS — B373 Candidiasis of vulva and vagina: Secondary | ICD-10-CM | POA: Diagnosis not present

## 2016-12-20 DIAGNOSIS — Z01419 Encounter for gynecological examination (general) (routine) without abnormal findings: Secondary | ICD-10-CM | POA: Diagnosis not present

## 2016-12-20 DIAGNOSIS — B3731 Acute candidiasis of vulva and vagina: Secondary | ICD-10-CM

## 2016-12-20 LAB — POCT URINALYSIS DIPSTICK
Bilirubin, UA: NEGATIVE
Blood, UA: NEGATIVE
Glucose, UA: NEGATIVE
Ketones, UA: NEGATIVE
Leukocytes, UA: NEGATIVE
Nitrite, UA: NEGATIVE
Protein, UA: NEGATIVE
Urobilinogen, UA: NEGATIVE
pH, UA: 5

## 2016-12-20 MED ORDER — FLUCONAZOLE 150 MG PO TABS: 150.0000 mg | ORAL_TABLET | Freq: Once | ORAL | 0 refills | Status: AC

## 2016-12-20 MED ORDER — VALACYCLOVIR HCL 500 MG PO TABS: 500.0000 mg | ORAL_TABLET | Freq: Two times a day (BID) | ORAL | 1 refills | Status: DC

## 2016-12-20 MED ORDER — NORETHIN ACE-ETH ESTRAD-FE 1.5-30 MG-MCG PO TABS: 1.0000 | ORAL_TABLET | Freq: Every day | ORAL | 4 refills | Status: DC

## 2016-12-20 NOTE — Progress Notes (Signed)
36 y.o. G1P1001 Single  Caucasian Fe here for annual exam. Periods normal, except for BTB every two weeks with spotting to light bleeding, no cramping. Has been on OCP for the past 4 months with no change. Also having increase vaginal discharge white with itching and irritation. Small bump ? HSV1 but no itching or burning from the bump. Please check. Patient delivered her son by C/S on 06/21/16. She is a single mom and used donor for pregnancy, happy with being a mom. Would like HIV and Hep C screening, no concerns. No other health issues today.  Patient's last menstrual period was 12/07/2016.          Sexually active: Yes.    The current method of family planning is OCP (estrogen/progesterone).    Exercising: Yes.    walking Smoker:  no  Health Maintenance: Pap:  08/21/12 neg, colpo & laser in 2002, normal since 2005 MMG:  none Colonoscopy:  none BMD:   none TDaP:  8/16 Shingles: no Pneumonia: no Hep C and HIV: HIV neg during pregnancy Labs: poct urine-neg Self breast exam: done occ   reports that she quit smoking about 13 years ago. She has never used smokeless tobacco. She reports that she does not drink alcohol or use drugs.  Past Medical History:  Diagnosis Date  . Abnormal Pap smear 2002   Colpo and Laser HPV  . Former smoker   . Headache    migraines  . HSV-1 (herpes simplex virus 1) infection 4/11   positive on peri-anal c & s  . Obesity   . Vaginal Pap smear, abnormal     Past Surgical History:  Procedure Laterality Date  . CESAREAN SECTION N/A 06/21/2016   Procedure: CESAREAN SECTION;  Surgeon: Jaymes Graff, MD;  Location: [facility];  Service: Obstetrics;  Laterality: N/A;  . CHOLECYSTECTOMY, LAPAROSCOPIC  2004  . COLPOSCOPY  2002   HPV, laser  . TONSILLECTOMY AND ADENOIDECTOMY  2002    Current Outpatient Prescriptions  Medication Sig Dispense Refill  . escitalopram (LEXAPRO) 10 MG tablet Take 1 tablet (10 mg total) by mouth daily. 30 tablet 2  .  ibuprofen (ADVIL,MOTRIN) 800 MG tablet Take 1 tablet (800 mg total) by mouth 3 (three) times daily. 90 tablet 1  . JUNEL FE 1/20 1-20 MG-MCG tablet   1   No current facility-administered medications for this visit.     Family History  Problem Relation Age of Onset  . Lung cancer Paternal Grandfather     ROS:  Pertinent items are noted in HPI.  Otherwise, a comprehensive ROS was negative.  Exam:   BP 120/80   Pulse 70   Resp 16   Ht 5' 2.75" (1.594 m)   Wt 201 lb (91.2 kg)   LMP 12/07/2016   Breastfeeding? No   BMI 35.89 kg/m  Height: 5' 2.75" (159.4 cm) Ht Readings from Last 3 Encounters:  12/20/16 5' 2.75" (1.594 m)  11/05/16 5\' 3"  (1.6 m)  06/19/16 5\' 2"  (1.575 m)    General appearance: alert, cooperative and appears stated age Head: Normocephalic, without obvious abnormality, atraumatic Neck: no adenopathy, supple, symmetrical, trachea midline and thyroid normal to inspection and palpation Lungs: clear to auscultation bilaterally Breasts: normal appearance, no masses or tenderness, No nipple retraction or dimpling, No nipple discharge or bleeding, No axillary or supraclavicular adenopathy Heart: regular rate and rhythm Abdomen: soft, non-tender; no masses,  no organomegaly Extremities: extremities normal, atraumatic, no cyanosis or edema Skin: Skin color, texture,  turgor normal. No rashes or lesions Lymph nodes: Cervical, supraclavicular, and axillary nodes normal. No abnormal inguinal nodes palpated Neurologic: Grossly normal   Pelvic: External genitalia: normal female, small bump noted on right vulva ? HSV, non tender,no blister noted              Urethra:  normal appearing urethra with no masses, tenderness or lesions              Bartholin's and Skene's: normal                 Vagina: normal appearing vagina with normal color and white thick discharge, no lesions, ph 4.0 wet prep taken              Cervix: no cervical motion tenderness, no lesions and nulliparous  appearance              Pap taken: Yes.   Bimanual Exam:  Uterus:  normal size, contour, position, consistency, mobility, non-tender              Adnexa: normal adnexa and no mass, fullness, tenderness               Rectovaginal: Confirms               Anus:  normal sphincter tone, no lesions  Chaperone present: yes  A:  Well Woman with normal exam  Contraception OCP desired, but BTB for the past 3  Months  Yeast vaginitis  History of HSV 1 ? outbreak  Screening labs  P:   Reviewed health and wellness pertinent to exam  Discussed change in dose to give better endometrial  Support, which should help with BTB. Patient agreeable. Discussed will need to advise if continues after 3 months.  Rx Junel 1.5/30 Fe see order with instructions  Discussed finding of Yeast vaginitis and need for treatment. Discussed comfort measures of Aveeno sitz bath and changing underwear if becomes moist. Questions addressed.  Rx Diflucan see order with instructions  Discussed if HSV symptoms occur to treat as before with Valtrex. Needs new RX. Questions addressed.  Rx Valtrex see order   Lab: HIV,Hep C  Pap smear as above with HPVHR   counseled on breast self exam, feminine hygiene, use and side effects of OCP's, adequate intake of calcium and vitamin D, diet and exercise  return annually or prn  An After Visit Summary was printed and given to the patient.

## 2016-12-20 NOTE — Patient Instructions (Signed)

## 2016-12-21 LAB — HEPATITIS C ANTIBODY: HCV Ab: NEGATIVE

## 2016-12-21 LAB — HIV ANTIBODY (ROUTINE TESTING W REFLEX): HIV 1&2 Ab, 4th Generation: NONREACTIVE

## 2016-12-22 NOTE — Progress Notes (Signed)
Encounter reviewed Yon Schiffman, MD   

## 2016-12-25 LAB — IPS PAP TEST WITH HPV

## 2016-12-26 ENCOUNTER — Telehealth: Payer: Self-pay

## 2016-12-26 NOTE — Telephone Encounter (Signed)
lmtcb

## 2016-12-26 NOTE — Telephone Encounter (Signed)
Pt notified by summer,cma

## 2016-12-26 NOTE — Telephone Encounter (Signed)
-----   Message from Verner Choleborah S Leonard, CNM sent at 12/26/2016  7:53 AM EST ----- Notify patient that pap smear is negative and HPVHR not detected 02 Pap smear showed yeast present if symptomatic needs Rx Terazol 7 one applicator per vagina every hs x 7 no refill

## 2016-12-31 ENCOUNTER — Ambulatory Visit (INDEPENDENT_AMBULATORY_CARE_PROVIDER_SITE_OTHER): Payer: 59 | Admitting: Physician Assistant

## 2016-12-31 ENCOUNTER — Encounter: Payer: Self-pay | Admitting: Physician Assistant

## 2016-12-31 VITALS — BP 136/90 | HR 56 | Temp 97.3°F | Resp 14 | Ht 62.75 in | Wt 205.4 lb

## 2016-12-31 DIAGNOSIS — Z79899 Other long term (current) drug therapy: Secondary | ICD-10-CM

## 2016-12-31 DIAGNOSIS — G43809 Other migraine, not intractable, without status migrainosus: Secondary | ICD-10-CM | POA: Diagnosis not present

## 2016-12-31 DIAGNOSIS — Z6830 Body mass index (BMI) 30.0-30.9, adult: Secondary | ICD-10-CM

## 2016-12-31 DIAGNOSIS — M545 Low back pain, unspecified: Secondary | ICD-10-CM

## 2016-12-31 DIAGNOSIS — E559 Vitamin D deficiency, unspecified: Secondary | ICD-10-CM

## 2016-12-31 DIAGNOSIS — E669 Obesity, unspecified: Secondary | ICD-10-CM | POA: Diagnosis not present

## 2016-12-31 MED ORDER — TOPIRAMATE 50 MG PO TABS: 50.0000 mg | ORAL_TABLET | Freq: Two times a day (BID) | ORAL | 2 refills | Status: DC

## 2016-12-31 MED ORDER — MELOXICAM 15 MG PO TABS: ORAL_TABLET | ORAL | 1 refills | Status: DC

## 2016-12-31 NOTE — Progress Notes (Signed)
Assessment and Plan: Acute midline low back pain without sciatica - meloxicam (MOBIC) 15 MG tablet; Take one daily with food for 2 weeks, can take with tylenol, can not take with aleve, iburpofen, then as needed daily for pain  Dispense: 30 tablet; Refill: 1 Stretches discussed If not better Xray since mid line  Class 1 obesity without serious comorbidity with body mass index (BMI) of 30.0 to 30.9 in adult, unspecified obesity type Try topamax, continue diet/healthy eating  Other migraine without status migrainosus, not intractable - topiramate (TOPAMAX) 50 MG tablet; Take 1 tablet (50 mg total) by mouth 2 (two) times daily.  Dispense: 60 tablet; Refill: 2 Minimal TMJ No red flags   HPI 36 y.o.female presents for 1 month follow up. She just started on vitamin D, B12 and iron.  She continue to have lower back pain, if sitting for a long time then standing or lying flat will feel it, will have cramping middle back, and will feel "catch" lower back with standing, no numbness,tingling, leg, urinary issues. Has tried ibuprofen but difficult to take multiple times a day.  She is sleeping better with lexapro, owen still getting up 4 hours a night.  She has been having more headaches, center head, nasal headaches, at work/stress related.   Past Medical History:  Diagnosis Date  . Abnormal Pap smear 2002   Colpo and Laser HPV  . Former smoker   . Headache    migraines  . HSV-1 (herpes simplex virus 1) infection 4/11   positive on peri-anal c & s  . Obesity   . Vaginal Pap smear, abnormal      Allergies  Allergen Reactions  . Other Other (See Comments)    steroid eye drops-causes glaucoma     Current Outpatient Prescriptions on File Prior to Visit  Medication Sig  . escitalopram (LEXAPRO) 10 MG tablet Take 1 tablet (10 mg total) by mouth daily.  Marland Kitchen. ibuprofen (ADVIL,MOTRIN) 800 MG tablet Take 1 tablet (800 mg total) by mouth 3 (three) times daily.  . norethindrone-ethinyl  estradiol-iron (JUNEL FE 1.5/30) 1.5-30 MG-MCG tablet Take 1 tablet by mouth daily.  . valACYclovir (VALTREX) 500 MG tablet Take 1 tablet (500 mg total) by mouth 2 (two) times daily. Take for 3 days at onset of outbreak   No current facility-administered medications on file prior to visit.     ROS: all negative except above.   Physical Exam: Filed Weights   12/31/16 0915  Weight: 205 lb 6.4 oz (93.2 kg)   BP 136/90   Pulse (!) 56   Temp 97.3 F (36.3 C)   Resp 14   Ht 5' 2.75" (1.594 m)   Wt 205 lb 6.4 oz (93.2 kg)   LMP 12/07/2016   SpO2 98%   BMI 36.68 kg/m  General Appearance: Well nourished, in no apparent distress. Eyes: PERRLA, EOMs, conjunctiva no swelling or erythema Sinuses: No Frontal/maxillary tenderness ENT/Mouth: Ext aud canals clear, TMs without erythema, bulging. No erythema, swelling, or exudate on post pharynx.  Tonsils not swollen or erythematous. Hearing normal.  Neck: Supple, thyroid normal.  Respiratory: Respiratory effort normal, BS equal bilaterally without rales, rhonchi, wheezing or stridor.  Cardio: RRR with no MRGs. Brisk peripheral pulses without edema.  Abdomen: Soft, + BS.  Non tender, no guarding, rebound, hernias, masses. Lymphatics: Non tender without lymphadenopathy.  Musculoskeletal: Full ROM, 5/5 strength, normal gait.  Skin: Warm, dry without rashes, lesions, ecchymosis.  Neuro: Cranial nerves intact. Normal muscle tone, no cerebellar  symptoms. Sensation intact.  Psych: Awake and oriented X 3, normal affect, Insight and Judgment appropriate.     Quentin Mulling, PA-C 9:47 AM [facility]

## 2016-12-31 NOTE — Patient Instructions (Signed)
Vitamin D goal is between 60-80  Please make sure that you are taking your Vitamin D as directed.   It is very important as a natural anti-inflammatory   helping hair, skin, and nails, as well as reducing stroke and heart attack risk.   It helps your bones and helps with mood.  We want you on at least 5000 IU daily  It also decreases numerous cancer risks so please take it as directed.   Low Vit D is associated with a 200-300% higher risk for CANCER   and 200-300% higher risk for HEART   ATTACK  &  STROKE.    .....................................Marland Kitchen  It is also associated with higher death rate at younger ages,   autoimmune diseases like Rheumatoid arthritis, Lupus, Multiple Sclerosis.     Also many other serious conditions, like depression, Alzheimer's  Dementia, infertility, muscle aches, fatigue, fibromyalgia - just to name a few.  +++++++++++++++++++  Can get liquid vitamin D from [place]  OR here in [place] at  [facility] alternatives [address], [place], [place] [address] Or you can try earth fare    What is the TMJ? The temporomandibular (tem-PUH-ro-man-DIB-yoo-ler) joint, or the TMJ, connects the upper and lower jawbones. This joint allows the jaw to open wide and move back and forth when you chew, talk, or yawn.There are also several muscles that help this joint move. There can be muscle tightness and pain in the muscle that can cause several symptoms.  What causes TMJ pain? There are many causes of TMJ pain. Repeated chewing (for example, chewing gum) and clenching your teeth can cause pain in the joint. Some TMJ pain has no obvious cause. What can I do to ease the pain? There are many things you can do to help your pain get better. When you have pain:  Eat soft foods and stay away from chewy foods (for example, taffy) Try to use both sides of your mouth to chew Don't chew gum Massage Don't open your mouth wide (for example, during yawning or singing) Don't  bite your cheeks or fingernails Lower your amount of stress and worry Applying a warm, damp washcloth to the joint may help. Over-the-counter pain medicines such as ibuprofen (one brand: Advil) or acetaminophen (one brand: Tylenol) might also help. Do not use these medicines if you are allergic to them or if your doctor told you not to use them. How can I stop the pain from coming back? When your pain is better, you can do these exercises to make your muscles stronger and to keep the pain from coming back:  Resisted mouth opening: Place your thumb or two fingers under your chin and open your mouth slowly, pushing up lightly on your chin with your thumb. Hold for three to six seconds. Close your mouth slowly. Resisted mouth closing: Place your thumbs under your chin and your two index fingers on the ridge between your mouth and the bottom of your chin. Push down lightly on your chin as you close your mouth. Tongue up: Slowly open and close your mouth while keeping the tongue touching the roof of the mouth. Side-to-side jaw movement: Place an object about one fourth of an inch thick (for example, two tongue depressors) between your front teeth. Slowly move your jaw from side to side. Increase the thickness of the object as the exercise becomes easier Forward jaw movement: Place an object about one fourth of an inch thick between your front teeth and move the bottom jaw forward  so that the bottom teeth are in front of the top teeth. Increase the thickness of the object as the exercise becomes easier. These exercises should not be painful. If it hurts to do these exercises, stop doing them and talk to your family doctor.

## 2017-01-31 ENCOUNTER — Ambulatory Visit: Payer: Self-pay | Admitting: Physician Assistant

## 2017-02-11 ENCOUNTER — Ambulatory Visit (HOSPITAL_COMMUNITY)
Admission: RE | Admit: 2017-02-11 | Discharge: 2017-02-11 | Disposition: A | Discharge status: Home / Self Care | Payer: 59 | Source: Ambulatory Visit | Attending: Physician Assistant | Admitting: Physician Assistant

## 2017-02-11 ENCOUNTER — Encounter: Payer: Self-pay | Admitting: Physician Assistant

## 2017-02-11 ENCOUNTER — Ambulatory Visit (INDEPENDENT_AMBULATORY_CARE_PROVIDER_SITE_OTHER): Payer: 59 | Admitting: Physician Assistant

## 2017-02-11 VITALS — BP 126/80 | HR 67 | Temp 97.3°F | Resp 16 | Ht 62.75 in | Wt 202.2 lb

## 2017-02-11 DIAGNOSIS — M545 Low back pain, unspecified: Secondary | ICD-10-CM

## 2017-02-11 DIAGNOSIS — Z79899 Other long term (current) drug therapy: Secondary | ICD-10-CM | POA: Diagnosis not present

## 2017-02-11 DIAGNOSIS — E669 Obesity, unspecified: Secondary | ICD-10-CM

## 2017-02-11 DIAGNOSIS — Z6830 Body mass index (BMI) 30.0-30.9, adult: Secondary | ICD-10-CM

## 2017-02-11 DIAGNOSIS — M47816 Spondylosis without myelopathy or radiculopathy, lumbar region: Secondary | ICD-10-CM | POA: Diagnosis not present

## 2017-02-11 DIAGNOSIS — M4316 Spondylolisthesis, lumbar region: Secondary | ICD-10-CM | POA: Diagnosis not present

## 2017-02-11 NOTE — Patient Instructions (Signed)

## 2017-02-11 NOTE — Progress Notes (Signed)
Assessment and Plan: Acute midline low back pain without sciatica - meloxicam (MOBIC) 15 MG tablet; Take one daily with food for 2 weeks, can take with tylenol, can not take with aleve, iburpofen, then as needed daily for pain  Dispense: 30 tablet; Refill: 1 Will do PT for neck and back for dry needling Will get Xray, and possibly MRI since mid line with decreased reflexes.   Class 1 obesity without serious comorbidity with body mass index (BMI) of 30.0 to 30.9 in adult, unspecified obesity type Try topamax, continue diet/healthy eating  Other migraine without status migrainosus, not intractable - topiramate (TOPAMAX) 50 MG tablet; Take 1 tablet (50 mg total) by mouth 2 (two) times daily.  Dispense: 60 tablet; Refill: 2 Minimal TMJ Improved with topamax No future appointments.   HPI 36 y.o.female presents for 1 month follow up. She just started on vitamin D, B12 and iron. Last visit she was started on topamax for back pain, migraines, and weight gain.  She continue to have lower back pain, if sitting for a long time then standing or lying flat will feel it, will have cramping middle back, and will feel "catch" lower back with standing, no numbness,tingling, leg, urinary issues. On mobic and tompamax 50mg  at night, helping with headaches. Still having back pain, worse with lying flat.  She is sleeping better with lexapro, owen still getting up 4 hours a night.  Dog had hemolytic anemia, very stressful, switching [facility] to [facility].  BMI is Body mass index is 36.1 kg/m., she is working on diet and exercise. Wt Readings from Last 3 Encounters:  02/11/17 202 lb 3.2 oz (91.7 kg)  12/31/16 205 lb 6.4 oz (93.2 kg)  12/20/16 201 lb (91.2 kg)     Past Medical History:  Diagnosis Date  . Abnormal Pap smear 2002   Colpo and Laser HPV  . Former smoker   . Headache    migraines  . HSV-1 (herpes simplex virus 1) infection 4/11   positive on peri-anal c & s  . Obesity   . Vaginal Pap  smear, abnormal      Allergies  Allergen Reactions  . Other Other (See Comments)    steroid eye drops-causes glaucoma     Current Outpatient Prescriptions on File Prior to Visit  Medication Sig  . escitalopram (LEXAPRO) 10 MG tablet Take 1 tablet (10 mg total) by mouth daily.  . meloxicam (MOBIC) 15 MG tablet Take one daily with food for 2 weeks, can take with tylenol, can not take with aleve, iburpofen, then as needed daily for pain  . norethindrone-ethinyl estradiol-iron (JUNEL FE 1.5/30) 1.5-30 MG-MCG tablet Take 1 tablet by mouth daily.  Marland Kitchen. topiramate (TOPAMAX) 50 MG tablet Take 1 tablet (50 mg total) by mouth 2 (two) times daily.  . valACYclovir (VALTREX) 500 MG tablet Take 1 tablet (500 mg total) by mouth 2 (two) times daily. Take for 3 days at onset of outbreak   No current facility-administered medications on file prior to visit.     ROS: all negative except above.   Physical Exam: Filed Weights   02/11/17 1103  Weight: 202 lb 3.2 oz (91.7 kg)   BP 126/80   Pulse 67   Temp 97.3 F (36.3 C)   Resp 16   Ht 5' 2.75" (1.594 m)   Wt 202 lb 3.2 oz (91.7 kg)   LMP 01/21/2017   SpO2 97%   BMI 36.10 kg/m  General Appearance: Well nourished, in no apparent  distress. Eyes: PERRLA, EOMs, conjunctiva no swelling or erythema Sinuses: No Frontal/maxillary tenderness ENT/Mouth: Ext aud canals clear, TMs without erythema, bulging. No erythema, swelling, or exudate on post pharynx.  Tonsils not swollen or erythematous. Hearing normal.  Neck: Supple, thyroid normal.  Respiratory: Respiratory effort normal, BS equal bilaterally without rales, rhonchi, wheezing or stridor.  Cardio: RRR with no MRGs. Brisk peripheral pulses without edema.  Abdomen: Soft, + BS.  Non tender, no guarding, rebound, hernias, masses. Lymphatics: Non tender without lymphadenopathy.  Musculoskeletal: Full ROM, 5/5 strength, normal gait.  Skin: Warm, dry without rashes, lesions, ecchymosis.  Neuro: Cranial  nerves intact. Normal muscle tone, no cerebellar symptoms. Sensation intact.  Psych: Awake and oriented X 3, normal affect, Insight and Judgment appropriate.     Quentin Mulling, PA-C 11:09 AM [facility]

## 2017-02-12 NOTE — Addendum Note (Signed)
Addended by: Doree AlbeeOLLIER, Arienne Gartin R on: 02/12/2017 08:32 AM   Modules accepted: Orders

## 2017-02-25 ENCOUNTER — Encounter: Payer: Self-pay | Admitting: Physician Assistant

## 2017-02-26 ENCOUNTER — Other Ambulatory Visit: Payer: Self-pay | Admitting: Physician Assistant

## 2017-02-26 DIAGNOSIS — M545 Low back pain, unspecified: Secondary | ICD-10-CM

## 2017-03-04 ENCOUNTER — Other Ambulatory Visit: Payer: Self-pay | Admitting: Physician Assistant

## 2017-03-04 ENCOUNTER — Encounter: Payer: Self-pay | Admitting: Physician Assistant

## 2017-03-04 MED ORDER — ESCITALOPRAM OXALATE 20 MG PO TABS: 20.0000 mg | ORAL_TABLET | Freq: Every day | ORAL | 2 refills | Status: DC

## 2017-03-14 DIAGNOSIS — M545 Low back pain: Secondary | ICD-10-CM | POA: Diagnosis not present

## 2017-03-17 DIAGNOSIS — M545 Low back pain: Secondary | ICD-10-CM | POA: Diagnosis not present

## 2017-04-01 ENCOUNTER — Other Ambulatory Visit: Payer: Self-pay | Admitting: Physician Assistant

## 2017-04-01 DIAGNOSIS — G43809 Other migraine, not intractable, without status migrainosus: Secondary | ICD-10-CM

## 2017-04-10 DIAGNOSIS — M549 Dorsalgia, unspecified: Secondary | ICD-10-CM | POA: Diagnosis not present

## 2017-04-10 DIAGNOSIS — M47812 Spondylosis without myelopathy or radiculopathy, cervical region: Secondary | ICD-10-CM | POA: Diagnosis not present

## 2017-04-10 DIAGNOSIS — M546 Pain in thoracic spine: Secondary | ICD-10-CM | POA: Diagnosis not present

## 2017-05-08 ENCOUNTER — Encounter (HOSPITAL_BASED_OUTPATIENT_CLINIC_OR_DEPARTMENT_OTHER): Payer: Self-pay

## 2017-05-08 ENCOUNTER — Emergency Department (HOSPITAL_BASED_OUTPATIENT_CLINIC_OR_DEPARTMENT_OTHER)
Admission: EM | Admit: 2017-05-08 | Discharge: 2017-05-08 | Disposition: A | Discharge status: Home / Self Care | Payer: 59 | Attending: Emergency Medicine | Admitting: Emergency Medicine

## 2017-05-08 ENCOUNTER — Encounter: Payer: Self-pay | Admitting: Physician Assistant

## 2017-05-08 DIAGNOSIS — Z87891 Personal history of nicotine dependence: Secondary | ICD-10-CM | POA: Insufficient documentation

## 2017-05-08 DIAGNOSIS — M791 Myalgia, unspecified site: Secondary | ICD-10-CM

## 2017-05-08 DIAGNOSIS — Z79899 Other long term (current) drug therapy: Secondary | ICD-10-CM | POA: Diagnosis not present

## 2017-05-08 DIAGNOSIS — B349 Viral infection, unspecified: Secondary | ICD-10-CM

## 2017-05-08 LAB — CBC WITH DIFFERENTIAL/PLATELET
Basophils Absolute: 0 10*3/uL (ref 0.0–0.1)
Basophils Relative: 0 %
Eosinophils Absolute: 0 10*3/uL (ref 0.0–0.7)
Eosinophils Relative: 0 %
HCT: 43.4 % (ref 36.0–46.0)
Hemoglobin: 14.5 g/dL (ref 12.0–15.0)
Lymphocytes Relative: 10 %
Lymphs Abs: 0.3 10*3/uL — ABNORMAL LOW (ref 0.7–4.0)
MCH: 30.7 pg (ref 26.0–34.0)
MCHC: 33.4 g/dL (ref 30.0–36.0)
MCV: 91.9 fL (ref 78.0–100.0)
Monocytes Absolute: 0.1 10*3/uL (ref 0.1–1.0)
Monocytes Relative: 3 %
Neutro Abs: 2.6 10*3/uL (ref 1.7–7.7)
Neutrophils Relative %: 87 %
Platelets: 262 10*3/uL (ref 150–400)
RBC: 4.72 MIL/uL (ref 3.87–5.11)
RDW: 13 % (ref 11.5–15.5)
WBC: 3 10*3/uL — ABNORMAL LOW (ref 4.0–10.5)

## 2017-05-08 LAB — BASIC METABOLIC PANEL
Anion gap: 9 (ref 5–15)
BUN: 9 mg/dL (ref 6–20)
CO2: 20 mmol/L — ABNORMAL LOW (ref 22–32)
Calcium: 8.7 mg/dL — ABNORMAL LOW (ref 8.9–10.3)
Chloride: 108 mmol/L (ref 101–111)
Creatinine, Ser: 0.82 mg/dL (ref 0.44–1.00)
GFR calc Af Amer: >60 mL/min (ref >60)
GFR calc non Af Amer: >60 mL/min (ref >60)
Glucose, Bld: 196 mg/dL — ABNORMAL HIGH (ref 65–99)
Potassium: 3.6 mmol/L (ref 3.5–5.1)
Sodium: 137 mmol/L (ref 135–145)

## 2017-05-08 LAB — URINALYSIS, MICROSCOPIC (REFLEX)

## 2017-05-08 LAB — URINALYSIS, ROUTINE W REFLEX MICROSCOPIC
Bilirubin Urine: NEGATIVE
Glucose, UA: 250 mg/dL — AB
Ketones, ur: NEGATIVE mg/dL
Leukocytes, UA: NEGATIVE
Nitrite: NEGATIVE
Protein, ur: NEGATIVE mg/dL
Specific Gravity, Urine: 1.018 (ref 1.005–1.030)
pH: 7 (ref 5.0–8.0)

## 2017-05-08 LAB — RAPID STREP SCREEN (MED CTR MEBANE ONLY): Streptococcus, Group A Screen (Direct): NEGATIVE

## 2017-05-08 MED ORDER — PREDNISONE 20 MG PO TABS: ORAL_TABLET | ORAL | 0 refills | Status: DC

## 2017-05-08 NOTE — ED Provider Notes (Signed)
[facility] Provider Note   CSN: [id number] Arrival date & time: 05/08/17  2037  By signing my name below, I, Sonum Patel, attest that this documentation has been prepared under the direction and in the presence of Alaynna Kerwood, PA-C. Marland Kitchen Electronically Signed: Leone Payor, Scribe. 05/08/17. 10:15 PM.  History   Chief Complaint Chief Complaint  Patient presents with  . Generalized Body Aches    The history is provided by the patient. No language interpreter was used.     HPI Comments: [name] [name] is a 36 y.o. female who presents to the Emergency Department complaining of gradual onset, constant, gradually worsened generalized myalgias and body cramping that began 4 days ago.  She has tried Mg, ibuprofen, Excedrin, Robaxin, prednisone, and increased water intake without relief. She notes an associated sore throat, fatigue, chills, and elevated temperature (99 F) today. She has sick contacts with similar symptoms. She denies cough, congestion, vomiting, Chest pain, shortness of breath, diarrhea or blood in stool.   Past Medical History:  Diagnosis Date  . Abnormal Pap smear 2002   Colpo and Laser HPV  . Former smoker   . Headache    migraines  . HSV-1 (herpes simplex virus 1) infection 4/11   positive on peri-anal c & s  . Obesity   . Vaginal Pap smear, abnormal     Patient Active Problem List   Diagnosis Date Noted  . Thyroid antibody positive 11/06/2016  . Status post primary low transverse cesarean section 06/22/2016  . Edema during pregnancy in second trimester 03/07/2016  . Hx of migraines 03/07/2016  . Situational stress 11/24/2014  . HSV (herpes simplex virus) anogenital infection 11/24/2014    Past Surgical History:  Procedure Laterality Date  . CESAREAN SECTION N/A 06/21/2016   Procedure: CESAREAN SECTION;  Surgeon: Jaymes Graff, MD;  Location: [facility];  Service: Obstetrics;  Laterality: N/A;  . CHOLECYSTECTOMY, LAPAROSCOPIC  2004    . COLPOSCOPY  2002   HPV, laser  . TONSILLECTOMY AND ADENOIDECTOMY  2002    OB History    Gravida Para Term Preterm AB Living   1 1 1     1    SAB TAB Ectopic Multiple Live Births         0 1       Home Medications    Prior to Admission medications   Medication Sig Start Date End Date Taking? Authorizing Provider  METHOCARBAMOL PO Take by mouth.   Yes [provider]  escitalopram (LEXAPRO) 20 MG tablet Take 1 tablet (20 mg total) by mouth daily. 03/04/17 03/04/18  Quentin Mulling, PA-C  norethindrone-ethinyl estradiol-iron (JUNEL FE 1.5/30) 1.5-30 MG-MCG tablet Take 1 tablet by mouth daily. 12/20/16   Verner Chol, CNM  predniSONE (DELTASONE) 20 MG tablet 2 tablets daily for 3 days, 1 tablet daily for 4 days. 05/08/17   Quentin Mulling, PA-C  topiramate (TOPAMAX) 50 MG tablet take 1 tablet by mouth twice a day 04/01/17   Quentin Mulling, PA-C  valACYclovir (VALTREX) 500 MG tablet Take 1 tablet (500 mg total) by mouth 2 (two) times daily. Take for 3 days at onset of outbreak 12/20/16   Verner Chol, CNM    Family History Family History  Problem Relation Age of Onset  . Lung cancer Paternal Grandfather     Social History Social History  Substance Use Topics  . Smoking status: Former Smoker    Quit date: 06/20/2003  . Smokeless tobacco: Never Used  Comment: in college  . Alcohol use No     Allergies   Other   Review of Systems Review of Systems  Constitutional: Positive for chills and fatigue. Negative for fever.  HENT: Positive for sore throat. Negative for congestion.   Respiratory: Negative for cough and shortness of breath.   Cardiovascular: Negative for chest pain.  Gastrointestinal: Positive for nausea. Negative for vomiting.  Musculoskeletal: Positive for myalgias.     Physical Exam Updated Vital Signs BP 117/73   Pulse 84   Temp 98.9 F (37.2 C) (Oral)   Resp 16   Ht 5\' 2"  (1.575 m)   Wt 86.2 kg (190 lb)   LMP 05/06/2017   SpO2  96%   BMI 34.75 kg/m   Physical Exam  Constitutional: She appears well-developed and well-nourished. No distress.  HENT:  Head: Normocephalic and atraumatic.  Right Ear: Tympanic membrane and ear canal normal.  Left Ear: Tympanic membrane and ear canal normal.  Nose: Nose normal.  Mouth/Throat: Posterior oropharyngeal edema present. No oropharyngeal exudate.  Patient does not appear to be in acute distress. No trismus or drooling present. No pooling of secretions. Patient is tolerating secretions and his nonrespiratory distress. No neck pain or tenderness to palpation of the neck. Full active and passive range of motion of the neck. No evidence of RPA or PTA.   Eyes: Conjunctivae and EOM are normal. Left eye exhibits no discharge. No scleral icterus.  Neck: Normal range of motion. Neck supple.  No neck pain  Cardiovascular: Normal rate, regular rhythm, normal heart sounds and intact distal pulses.  Exam reveals no gallop and no friction rub.   No murmur heard. Pulmonary/Chest: Effort normal and breath sounds normal. No respiratory distress.  Abdominal: Soft. Bowel sounds are normal. She exhibits no distension. There is no tenderness. There is no guarding.  Musculoskeletal: Normal range of motion. She exhibits no edema.  Neurological: She is alert. She exhibits normal muscle tone. Coordination normal.  Strength 5/5 in bilateral upper and lower extremities. No objective signs of numbness present.  Skin: Skin is warm and dry. No rash noted.  Psychiatric: She has a normal mood and affect.  Nursing note and vitals reviewed.    ED Treatments / Results  DIAGNOSTIC STUDIES: Oxygen Saturation is 96% on RA, adequate by my interpretation.    COORDINATION OF CARE: 10:09 PM Discussed treatment plan with pt at bedside and pt agreed to plan.   Labs (all labs ordered are listed, but only abnormal results are displayed) Labs Reviewed  BASIC METABOLIC PANEL - Abnormal; Notable for the  following:       Result Value   CO2 20 (*)    Glucose, Bld 196 (*)    Calcium 8.7 (*)    All other components within normal limits  CBC WITH DIFFERENTIAL/PLATELET - Abnormal; Notable for the following:    WBC 3.0 (*)    Lymphs Abs 0.3 (*)    All other components within normal limits  URINALYSIS, ROUTINE W REFLEX MICROSCOPIC - Abnormal; Notable for the following:    APPearance CLOUDY (*)    Glucose, UA 250 (*)    Hgb urine dipstick SMALL (*)    All other components within normal limits  URINALYSIS, MICROSCOPIC (REFLEX) - Abnormal; Notable for the following:    Bacteria, UA FEW (*)    Squamous Epithelial / LPF 0-5 (*)    All other components within normal limits  RAPID STREP SCREEN (NOT AT [facility])  CULTURE, GROUP A  STREP [facility]    EKG  EKG Interpretation None       Radiology No results found.  Procedures Procedures (including critical care time)  Medications Ordered in ED Medications - No data to display   Initial Impression / Assessment and Plan / ED Course  I have reviewed the triage vital signs and the nursing notes.  Pertinent labs & imaging results that were available during my care of the patient were reviewed by me and considered in my medical decision making (see chart for details).     Patient presents to the ED for complaints of generalized bodyaches, sore throat that began 4 days ago. She states that she has sick contact at home with strep pharyngitis concerned that she has the same thing. She contacted her PCP who put her on prednisone starting today. Strength 5/5 in bilateral upper and lower extremities. No signs of numbness present. CBC, BMP unremarkable, with the exception of leukopenia at 3.0. All other differential lines normal. Urinalysis with no evidence of UTI. On physical exam her pharynx appears erythematous and edematous but not consistent with strep. Strep test returned as negative and sent for culture. She is tolerating secretions, not in  respiratory distress, no neck pain, no trismus. Presentation not concerning for peritonsillar abscess or spread of infection to deep spaces of the throat; patent airway. Symptoms could be due to influenza or other viral illness. Patient will be discharged with symptomatic treatment. Ibuprofen or Tylenol as needed for pain/fever. Continue taking home medications as previously prescribed. Specific return precautions discussed. Recommended PCP follow up and patient states that she'll call her PCP tomorrow and make them aware of her WBC count of 3.0. Patient appears stable for discharge at this time.   Final Clinical Impressions(s) / ED Diagnoses   Final diagnoses:  Viral syndrome  Myalgia    New Prescriptions Discharge Medication List as of 05/08/2017 11:35 PM     I personally performed the services described in this documentation, which was scribed in my presence. The recorded information has been reviewed and is accurate.    Dietrich PatesKhatri, Kaori Jumper, PA-C 05/09/17 [id number]    Marily MemosMesner, Jason, MD 05/10/17 1047

## 2017-05-08 NOTE — Discharge Instructions (Signed)
Follow-up with PCP for further evaluation as soon as possible and for monitoring of blood work. Continue medications as previously prescribed. Return to ED for worsening pain, trouble breathing, trouble swallowing, increased fever, vomiting, lightheadedness.

## 2017-05-08 NOTE — ED Triage Notes (Signed)
C/o body aches, chills, fatigue x 2 days-NAD-steady gait

## 2017-05-09 ENCOUNTER — Encounter: Payer: Self-pay | Admitting: Physician Assistant

## 2017-05-11 LAB — CULTURE, GROUP A STREP (THRC)

## 2017-05-12 ENCOUNTER — Encounter: Payer: Self-pay | Admitting: Physician Assistant

## 2017-05-12 NOTE — Progress Notes (Signed)
Assessment and Plan:  Situational stress Continue lexapro  Hx of migraines Continue topamax  Thyroid antibody positive -     TSH; Future  Class 1 obesity without serious comorbidity with body mass index (BMI) of 30.0 to 30.9 in adult, unspecified obesity type -     phentermine (ADIPEX-P) 37.5 MG tablet; Take 1 tablet (37.5 mg total) by mouth daily before breakfast.  Medication management -     CBC with Differential/Platelet; Future -     BASIC METABOLIC PANEL WITH GFR; Future -     Hepatic function panel; Future  Glucose found in urine on examination -     Urinalysis, Routine w reflex microscopic; Future -     Microalbumin / creatinine urine ratio; Future   Future Appointments Date Time Provider Department Center  11/13/2017 10:30 AM Quentin Mulling, PA-C [facility] None     HPI 36 y.o.female presents for 3 month follow up. Her blood pressure has been controlled at home, today their BP is BP: 128/74 She just started on vitamin D, B12 and iron.  She is on topamax for back pain, migraines, and weight gain,  She is sleeping better with lexapro, on for stress/anxiety.  BMI is Body mass index is 35 kg/m., she is working on diet and exercise. Wt Readings from Last 3 Encounters:  05/13/17 196 lb (88.9 kg)  05/08/17 190 lb (86.2 kg)  02/11/17 202 lb 3.2 oz (91.7 kg)    Past Medical History:  Diagnosis Date  . Abnormal Pap smear 2002   Colpo and Laser HPV  . Former smoker   . Headache    migraines  . HSV-1 (herpes simplex virus 1) infection 4/11   positive on peri-anal c & s  . Obesity   . Vaginal Pap smear, abnormal      Allergies  Allergen Reactions  . Other Other (See Comments)    steroid eye drops-causes glaucoma     Current Outpatient Prescriptions on File Prior to Visit  Medication Sig  . escitalopram (LEXAPRO) 20 MG tablet Take 1 tablet (20 mg total) by mouth daily.  . norethindrone-ethinyl estradiol-iron (JUNEL FE 1.5/30) 1.5-30 MG-MCG tablet Take 1  tablet by mouth daily.  . predniSONE (DELTASONE) 20 MG tablet 2 tablets daily for 3 days, 1 tablet daily for 4 days.  Marland Kitchen topiramate (TOPAMAX) 50 MG tablet take 1 tablet by mouth twice a day  . valACYclovir (VALTREX) 500 MG tablet Take 1 tablet (500 mg total) by mouth 2 (two) times daily. Take for 3 days at onset of outbreak   No current facility-administered medications on file prior to visit.     ROS: all negative except above.   Physical Exam: Filed Weights   05/13/17 1546  Weight: 196 lb (88.9 kg)   BP 128/74   Pulse 75   Temp 97 F (36.1 C)   Resp 16   Ht 5' 2.75" (1.594 m)   Wt 196 lb (88.9 kg)   LMP 05/06/2017   SpO2 98%   BMI 35.00 kg/m  General Appearance: Well nourished, in no apparent distress. Eyes: PERRLA, EOMs, conjunctiva no swelling or erythema Sinuses: No Frontal/maxillary tenderness ENT/Mouth: Ext aud canals clear, TMs without erythema, bulging. No erythema, swelling, or exudate on post pharynx.  Tonsils not swollen or erythematous. Hearing normal.  Neck: Supple, thyroid normal.  Respiratory: Respiratory effort normal, BS equal bilaterally without rales, rhonchi, wheezing or stridor.  Cardio: RRR with no MRGs. Brisk peripheral pulses without edema.  Abdomen: Soft, + BS.  Non tender, no guarding, rebound, hernias, masses. Lymphatics: Non tender without lymphadenopathy.  Musculoskeletal: Full ROM, 5/5 strength, normal gait.  Skin: Warm, dry without rashes, lesions, ecchymosis.  Neuro: Cranial nerves intact. Normal muscle tone, no cerebellar symptoms. Sensation intact.  Psych: Awake and oriented X 3, normal affect, Insight and Judgment appropriate.     Quentin MullingAmanda Collier, PA-C 5:38 PM [facility]

## 2017-05-13 ENCOUNTER — Ambulatory Visit (INDEPENDENT_AMBULATORY_CARE_PROVIDER_SITE_OTHER): Payer: 59 | Admitting: Physician Assistant

## 2017-05-13 ENCOUNTER — Encounter: Payer: Self-pay | Admitting: Physician Assistant

## 2017-05-13 VITALS — BP 128/74 | HR 75 | Temp 97.0°F | Resp 16 | Ht 62.75 in | Wt 196.0 lb

## 2017-05-13 DIAGNOSIS — Z79899 Other long term (current) drug therapy: Secondary | ICD-10-CM | POA: Diagnosis not present

## 2017-05-13 DIAGNOSIS — E669 Obesity, unspecified: Secondary | ICD-10-CM | POA: Diagnosis not present

## 2017-05-13 DIAGNOSIS — R760 Raised antibody titer: Secondary | ICD-10-CM | POA: Diagnosis not present

## 2017-05-13 DIAGNOSIS — F439 Reaction to severe stress, unspecified: Secondary | ICD-10-CM | POA: Diagnosis not present

## 2017-05-13 DIAGNOSIS — Z6830 Body mass index (BMI) 30.0-30.9, adult: Secondary | ICD-10-CM | POA: Diagnosis not present

## 2017-05-13 DIAGNOSIS — R81 Glycosuria: Secondary | ICD-10-CM | POA: Diagnosis not present

## 2017-05-13 DIAGNOSIS — Z8669 Personal history of other diseases of the nervous system and sense organs: Secondary | ICD-10-CM

## 2017-05-13 DIAGNOSIS — R768 Other specified abnormal immunological findings in serum: Secondary | ICD-10-CM

## 2017-05-13 MED ORDER — PHENTERMINE HCL 37.5 MG PO TABS: 37.5000 mg | ORAL_TABLET | Freq: Every day | ORAL | 2 refills | Status: DC

## 2017-05-13 NOTE — Patient Instructions (Signed)
Phentermine  While taking the medication we may ask that you come into the office once a month or once every 2-3 months to monitor your weight, blood pressure, and heart rate. In addition we can help answer your questions about diet, exercise, and help you every step of the way with your weight loss journey. Sometime it is helpful if you bring in a food diary or use an app on your phone such as myfitnesspal to record your calorie intake, especially in the beginning.   You can start out on 1/3 to 1/2 a pill in the morning and if you are tolerating it well you can increase to one pill daily. I also have some patients that take 1/3 or 1/2 at lunch to help prevent night time eating.  This medication is cheapest CASH pay at [facility] OR [employer] [facility] [employer] is 14-17 dollars and you do NOT need a membership to get meds from there.    What is this medicine? PHENTERMINE (FEN ter meen) decreases your appetite. This medicine is intended to be used in addition to a healthy reduced calorie diet and exercise. The best results are achieved this way. This medicine is only indicated for short-term use. Eventually your weight loss may level out and the medication will no longer be needed.   How should I use this medicine? Take this medicine by mouth. Follow the directions on the prescription label. The tablets should stay in the bottle until immediately before you take your dose. Take your doses at regular intervals. Do not take your medicine more often than directed.  Overdosage: If you think you have taken too much of this medicine contact a poison control center or emergency room at once. NOTE: This medicine is only for you. Do not share this medicine with others.  What if I miss a dose? If you miss a dose, take it as soon as you can. If it is almost time for your next dose, take only that dose. Do not take double or extra doses. Do not increase or in any way change your dose without consulting your  doctor.  What should I watch for while using this medicine? Notify your physician immediately if you become short of breath while doing your normal activities. Do not take this medicine within 6 hours of bedtime. It can keep you from getting to sleep. Avoid drinks that contain caffeine and try to stick to a regular bedtime every night. Do not stand or sit up quickly, especially if you are an older patient. This reduces the risk of dizzy or fainting spells. Avoid alcoholic drinks.  What side effects may I notice from receiving this medicine? Side effects that you should report to your doctor or health care professional as soon as possible: -chest pain, palpitations -depression or severe changes in mood -increased blood pressure -irritability -nervousness or restlessness -severe dizziness -shortness of breath -problems urinating -unusual swelling of the legs -vomiting  Side effects that usually do not require medical attention (report to your doctor or health care professional if they continue or are bothersome): -blurred vision or other eye problems -changes in sexual ability or desire -constipation or diarrhea -difficulty sleeping -dry mouth or unpleasant taste -headache -nausea This list may not describe all possible side effects. Call your doctor for medical advice about side effects. You may report side effects to FDA at 1-800-FDA-1088. .    Bad carbs also include fruit juice, alcohol, and sweet tea. These are empty calories that do not  signal to your brain that you are full.   Please remember the good carbs are still carbs which convert into sugar. So please measure them out no more than 1/2-1 cup of rice, oatmeal, pasta, and beans  Veggies are however free foods! Pile them on.   Not all fruit is created equal. Please see the list below, the fruit at the bottom is higher in sugars than the fruit at the top. Please avoid all dried fruits.     We want weight loss that will  last so you should lose 1-2 pounds a week.  THAT IS IT! Please pick THREE things a month to change. Once it is a habit check off the item. Then pick another three items off the list to become habits.  If you are already doing a habit on the list GREAT!  Cross that item off! o Don't drink your calories. Ie, alcohol, soda, fruit juice, and sweet tea.  o Drink more water. Drink a glass when you feel hungry or before each meal.  o Eat breakfast - Complex carb and protein (likeDannon light and fit yogurt, oatmeal, fruit, eggs, [place]urkey bacon). o Measure your cereal.  Eat no more than one cup a day. (ie [place]ashi) o Eat an apple a day. o Add a vegetable a day. o Try a new vegetable a month. o Use Pam! Stop using oil or butter to cook. o Don't finish your plate or use smaller plates. o Share your dessert. o Eat sugar free Jello for dessert or frozen grapes. o Don't eat 2-3 hours before bed. o Switch to whole wheat bread, pasta, and brown rice. o Make healthier choices when you eat out. No fries! o Pick baked chicken, NOT fried. o Don't forget to SLOW DOWN when you eat. It is not going anywhere.  o Take the stairs. o Park far away in the parking lot o [employer] soup cans (or weights) for 10 minutes while watching TV. o Walk at work for 10 minutes during break. o Walk outside 1 time a week with your friend, kids, dog, or significant other. o Start a walking group at church. o Walk the mall as much as you can tolerate.  o Keep a food diary. o Weigh yourself daily. o Walk for 15 minutes 3 days per week. o Cook at home more often and eat out less.  If life happens and you go back to old habits, it is okay.  Just start over. You can do it!   If you experience chest pain, get short of breath, or tired during the exercise, please stop immediately and inform your doctor.

## 2017-05-22 ENCOUNTER — Ambulatory Visit: Payer: Self-pay | Admitting: Physician Assistant

## 2017-05-28 ENCOUNTER — Encounter: Payer: Self-pay | Admitting: Physician Assistant

## 2017-05-28 DIAGNOSIS — G43809 Other migraine, not intractable, without status migrainosus: Secondary | ICD-10-CM

## 2017-05-28 MED ORDER — TOPIRAMATE 50 MG PO TABS: 50.0000 mg | ORAL_TABLET | Freq: Two times a day (BID) | ORAL | 2 refills | Status: DC

## 2017-05-28 MED ORDER — ESCITALOPRAM OXALATE 20 MG PO TABS: 20.0000 mg | ORAL_TABLET | Freq: Every day | ORAL | 2 refills | Status: DC

## 2017-06-18 ENCOUNTER — Encounter: Payer: Self-pay | Admitting: Physician Assistant

## 2017-06-19 ENCOUNTER — Other Ambulatory Visit: Payer: Self-pay | Admitting: Physician Assistant

## 2017-06-19 DIAGNOSIS — E669 Obesity, unspecified: Secondary | ICD-10-CM

## 2017-06-19 DIAGNOSIS — Z6830 Body mass index (BMI) 30.0-30.9, adult: Principal | ICD-10-CM

## 2017-06-19 MED ORDER — PHENTERMINE HCL 37.5 MG PO TABS: 37.5000 mg | ORAL_TABLET | Freq: Every day | ORAL | 2 refills | Status: DC

## 2017-06-19 NOTE — Progress Notes (Signed)
Phentermine called into pharmacy Aug 2nd 2018 at 8:20am by DD

## 2017-09-09 ENCOUNTER — Encounter: Payer: Self-pay | Admitting: Physician Assistant

## 2017-11-13 ENCOUNTER — Ambulatory Visit: Payer: Self-pay | Admitting: Physician Assistant

## 2018-01-07 ENCOUNTER — Encounter: Payer: Self-pay | Admitting: Physician Assistant

## 2018-01-07 MED ORDER — BUPROPION HCL 75 MG PO TABS: 75.0000 mg | ORAL_TABLET | Freq: Two times a day (BID) | ORAL | 2 refills | Status: DC

## 2018-07-17 ENCOUNTER — Encounter: Payer: Self-pay | Admitting: Adult Health

## 2018-08-04 ENCOUNTER — Encounter: Payer: Self-pay | Admitting: Adult Health

## 2018-08-04 DIAGNOSIS — E559 Vitamin D deficiency, unspecified: Secondary | ICD-10-CM | POA: Insufficient documentation

## 2018-08-04 NOTE — Progress Notes (Addendum)
Complete Physical  Assessment and Plan:  Diagnoses and all orders for this visit:  Encounter for routine adult health examination with abnormal findings  Morbid obesity (HCC) Long discussion about weight loss, diet, and exercise Recommended diet heavy in fruits and veggies and low in animal meats, cheeses, and dairy products, appropriate calorie intake Discussed appropriate weight for height and initial goal (190lb) Start wellbutrin 150 mg SR daily for mood and weight loss benefits - follow up Dr. Cyndia SkeetersLurey Follow up at next visit  Thyroid antibody positive Monitor closely; initiate treatment as indicated ? Mild thyromegaly today, declines US to next year due to high deductible -     TSH  Situational stress Well managed off of medications, wants to try wellbutrin for mood/weight benefits Followed by Dr. Cyndia SkeetersLurey Stress management techniques discussed, increase water, good sleep hygiene discussed, increase exercise, and increase veggies.   History of HSV-1 infection Antivirals PRN  Vitamin D deficiency Start supplement, check levels next year  Screening for diabetes mellitus -     Hemoglobin A1c  Screening cholesterol level -     Lipid panel- declines  Screening for hematuria or proteinuria -     Urinalysis w microscopic + reflex cultur  Medication management -     CBC with Differential/Platelet -     COMPLETE METABOLIC PANEL WITH GFR   High deductible, requesting bare minimum labs today-  Discussed med's effects and SE's. Screening labs and tests as requested with regular follow-up as recommended. Over 40 minutes of exam, counseling, chart review, and complex, high level critical decision making was performed this visit.   Future Appointments  Date Time Provider Department Center  08/09/2019  2:00 PM Judd Gaudierorbett, Hadiya Spoerl, NP [facility] None     HPI  37 y.o. 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   she has a diagnosis of situational depression/anxiety and is currently off of medications and doing well. she is seeing a therapist Dr. Cyndia SkeetersLurey.   BMI is Body mass index is 36.85 kg/m., she is working on diet and exercise, has lost some weight since returning from [place]. Didn't tolerate phentermine, difficult to get off topamax in past, willing to retry wellbutrin.  Wt Readings from Last 3 Encounters:  08/05/18 206 lb 6.4 oz (93.6 kg)  05/13/17 196 lb (88.9 kg)  05/08/17 190 lb (86.2 kg)   Today their BP is BP: 110/74 She does workout. She denies chest pain, shortness of breath, dizziness.   The cholesterol last visit was:   Lab Results  Component Value Date   CHOL 144 11/05/2016   HDL 51 11/05/2016   LDLCALC 66 11/05/2016   TRIG 136 11/05/2016   CHOLHDL 2.8 11/05/2016   Last W4XA1C in the office was:  Lab Results  Component Value Date   HGBA1C 5.3 11/05/2016   Last GFR: Lab Results  Component Value Date   GFRNONAA >60 05/08/2017   She has hx of + thyroid antibody and is monitored closely:  Lab Results  Component Value Date   TSH 0.87 11/05/2016   Patient is not on Vitamin D supplement.   Lab Results  Component Value Date   VD25OH 32 11/05/2016      Current Medications:  Current Outpatient Medications on File Prior to Visit  Medication  Sig Dispense Refill  . norethindrone-ethinyl estradiol-iron (JUNEL FE 1.5/30) 1.5-30 MG-MCG tablet Take 1 tablet by mouth daily. 3 Package 4  . phentermine (ADIPEX-P) 37.5 MG tablet Take 1 tablet (37.5 mg total) by mouth daily before breakfast. 30 tablet 2  . topiramate (TOPAMAX) 50 MG tablet Take 1 tablet (50 mg total) by mouth 2 (two) times daily. 180 tablet 2  . valACYclovir (VALTREX) 500 MG tablet Take 1 tablet (500 mg total) by mouth 2 (two) times daily.  Take for 3 days at onset of outbreak 30 tablet 1  . escitalopram (LEXAPRO) 20 MG tablet Take 1 tablet (20 mg total) by mouth daily. 90 tablet 2  . [DISCONTINUED] buPROPion (WELLBUTRIN) 75 MG tablet Take 1 tablet (75 mg total) by mouth 2 (two) times daily. 60 tablet 2   No current facility-administered medications on file prior to visit.    Allergies:  Allergies  Allergen Reactions  . Other Other (See Comments)    steroid eye drops-causes glaucoma    Medical History:  She has Situational stress; Thyroid antibody positive; Morbid obesity (HCC); History of HSV-1 infection; and Vitamin D deficiency on their problem list. Health Maintenance:   Immunization History  Administered Date(s) Administered  . HPV Quadrivalent 01/10/2006, 03/11/2006, 07/10/2006  . Tdap 06/19/2015    Tetanus: 2016 Flu vaccine: will get at work HPV: 3/3, 2007  LMP: No LMP recorded. Pap: 12/2016 at GYN, HPV neg MGM: due next year DEXA: - Colonoscopy: - EGD: -  Last Dental Exam: Dr. Coralie Common in [place], goes q60m Last Eye Exam: wears glasses, Dr. Donnella Bi, 05/2017  Patient Care Team: Lucky Cowboy, MD as PCP - General (Internal Medicine)  Surgical History:  She has a past surgical history that includes Cholecystectomy, laparoscopic (2004); Tonsillectomy and adenoidectomy (2002); Colposcopy (2002); and Cesarean section (N/A, 06/21/2016). Family History:  Herfamily history includes ALS in her maternal grandmother; Alzheimer's disease in her maternal grandfather; Breast cancer (age of onset: 53) in her maternal grandmother; Depression in her mother; Lung cancer in her paternal grandfather; Prostate cancer in her maternal grandfather; Suicidality in her mother; Thyroid cancer in her maternal aunt. Social History:  She reports that she quit smoking about 15 years ago. She has never used smokeless tobacco. She reports that she does not drink alcohol or use drugs.  Review of Systems: Review of Systems   Constitutional: Negative for malaise/fatigue and weight loss.  HENT: Negative for hearing loss and tinnitus.   Eyes: Negative for blurred vision and double vision.  Respiratory: Negative for cough, shortness of breath and wheezing.   Cardiovascular: Negative for chest pain, palpitations, orthopnea, claudication and leg swelling.  Gastrointestinal: Negative for abdominal pain, blood in stool, constipation, diarrhea, heartburn, melena, nausea and vomiting.  Genitourinary: Negative.   Musculoskeletal: Negative for joint pain and myalgias.  Skin: Negative for rash.  Neurological: Negative for dizziness, tingling, sensory change, weakness and headaches.  Endo/Heme/Allergies: Negative for polydipsia.  Psychiatric/Behavioral: Negative.   All other systems reviewed and are negative.   Physical Exam: Estimated body mass index is 36.85 kg/m as calculated from the following:   Height as of this encounter: 5' 2.75" (1.594 m).   Weight as of this encounter: 206 lb 6.4 oz (93.6 kg). BP 110/74   Pulse 74   Temp 97.9 F (36.6 C)   Ht 5' 2.75" (1.594 m)   Wt 206 lb 6.4 oz (93.6 kg)   SpO2 97%   BMI 36.85 kg/m  General Appearance: Well nourished, in no apparent  distress.  Eyes: PERRLA, EOMs, conjunctiva no swelling or erythema, normal fundi and vessels.  Sinuses: No Frontal/maxillary tenderness  ENT/Mouth: Ext aud canals clear, normal light reflex with TMs without erythema, bulging. Good dentition. No erythema, swelling, or exudate on post pharynx. Tonsils not swollen or erythematous. Hearing normal.  Neck: Supple, thyroid questionably mildly enlarged without palpable nodules. No bruits  Respiratory: Respiratory effort normal, BS equal bilaterally without rales, rhonchi, wheezing or stridor.  Cardio: RRR without murmurs, rubs or gallops. Brisk peripheral pulses without edema.  Chest: symmetric, with normal excursions and percussion.  Breasts: Patient declines; defer to GYN Abdomen: Soft,  nontender, no guarding, rebound, hernias, masses, or organomegaly.  Lymphatics: Non tender without lymphadenopathy.  Genitourinary: Defer to GYN Musculoskeletal: Full ROM all peripheral extremities,5/5 strength, and normal gait.  Skin: Warm, dry without rashes, lesions, ecchymosis. Neuro: Cranial nerves intact, reflexes equal bilaterally. Normal muscle tone, no cerebellar symptoms. Sensation intact.  Psych: Awake and oriented X 3, normal affect, Insight and Judgment appropriate.   EKG: Defer  Dan Maker 2:27 PM [facility]

## 2018-08-05 ENCOUNTER — Ambulatory Visit (INDEPENDENT_AMBULATORY_CARE_PROVIDER_SITE_OTHER): Payer: 59 | Admitting: Adult Health

## 2018-08-05 ENCOUNTER — Encounter: Payer: Self-pay | Admitting: Adult Health

## 2018-08-05 VITALS — BP 110/74 | HR 74 | Temp 97.9°F | Ht 62.75 in | Wt 206.4 lb

## 2018-08-05 DIAGNOSIS — Z131 Encounter for screening for diabetes mellitus: Secondary | ICD-10-CM

## 2018-08-05 DIAGNOSIS — R768 Other specified abnormal immunological findings in serum: Secondary | ICD-10-CM | POA: Diagnosis not present

## 2018-08-05 DIAGNOSIS — Z1389 Encounter for screening for other disorder: Secondary | ICD-10-CM

## 2018-08-05 DIAGNOSIS — F439 Reaction to severe stress, unspecified: Secondary | ICD-10-CM

## 2018-08-05 DIAGNOSIS — Z0000 Encounter for general adult medical examination without abnormal findings: Secondary | ICD-10-CM

## 2018-08-05 DIAGNOSIS — E559 Vitamin D deficiency, unspecified: Secondary | ICD-10-CM

## 2018-08-05 DIAGNOSIS — B009 Herpesviral infection, unspecified: Secondary | ICD-10-CM

## 2018-08-05 DIAGNOSIS — Z79899 Other long term (current) drug therapy: Secondary | ICD-10-CM | POA: Diagnosis not present

## 2018-08-05 DIAGNOSIS — Z0001 Encounter for general adult medical examination with abnormal findings: Secondary | ICD-10-CM

## 2018-08-05 DIAGNOSIS — Z13220 Encounter for screening for lipoid disorders: Secondary | ICD-10-CM

## 2018-08-05 MED ORDER — BUPROPION HCL ER (XL) 150 MG PO TB24: 150.0000 mg | ORAL_TABLET | ORAL | 2 refills | Status: DC

## 2018-08-05 NOTE — Patient Instructions (Addendum)
[name] [name] , Thank you for taking time to come for your Medicare Wellness Visit. I appreciate your ongoing commitment to your health goals. Please review the following plan we discussed and let me know if I can assist you in the future.   These are the goals we discussed: Goals    . Weight (lb) < 200 lb (90.7 kg)       This is a list of the screening recommended for you and due dates:  Health Maintenance  Topic Date Due  . Flu Shot  06/18/2018  . Pap Smear  12/21/2019  . Tetanus Vaccine  06/18/2025  . HIV Screening  Completed    Know what a healthy weight is for you (roughly BMI <25) and aim to maintain this  Aim for 7+ servings of fruits and vegetables daily  65-80+ fluid ounces of water or unsweet tea for healthy kidneys  Limit to max 1 drink of alcohol per day; avoid smoking/tobacco  Limit animal fats in diet for cholesterol and heart health - choose grass fed whenever available  Avoid highly processed foods, and foods high in saturated/trans fats  Aim for low stress - take time to unwind and care for your mental health  Aim for 150 min of moderate intensity exercise weekly for heart health, and weights twice weekly for bone health  Aim for 7-9 hours of sleep daily       When it comes to diets, agreement about the perfect plan isn't easy to find, even among the experts. Experts at the [facility] [employer] developed an idea known as the Healthy Eating Plate. Just imagine a plate divided into logical, healthy portions.  The emphasis is on diet quality:  Load up on vegetables and fruits - one-half of your plate: Aim for color and variety, and remember that potatoes don't count.  Go for whole grains - one-quarter of your plate: Whole wheat, barley, wheat berries, quinoa, oats, brown rice, and foods made with them. If you want pasta, go with whole wheat pasta.  Protein power - one-quarter of your plate: Fish, chicken, beans, and nuts are all  healthy, versatile protein sources. Limit red meat.  The diet, however, does go beyond the plate, offering a few other suggestions.  Use healthy plant oils, such as olive, canola, soy, corn, sunflower and peanut. Check the labels, and avoid partially hydrogenated oil, which have unhealthy trans fats.  If you're thirsty, drink water. Coffee and tea are good in moderation, but skip sugary drinks and limit milk and dairy products to one or two daily servings.  The type of carbohydrate in the diet is more important than the amount. Some sources of carbohydrates, such as vegetables, fruits, whole grains, and beans-are healthier than others.  Finally, stay active.    Bupropion sustained-release tablets (Depression/Mood Disorders) What is this medicine? BUPROPION (byoo PROE pee on) is used to treat depression. This medicine may be used for other purposes; ask your health care provider or pharmacist if you have questions. COMMON BRAND NAME(S): Budeprion SR, Wellbutrin SR What should I tell my health care provider before I take this medicine? They need to know if you have any of these conditions: -an eating disorder, such as anorexia or bulimia -bipolar disorder or psychosis -diabetes or high blood sugar, treated with medication -glaucoma -head injury or brain tumor -heart disease, previous heart attack, or irregular heart beat -high blood pressure -kidney or liver disease -seizures -suicidal thoughts or a previous suicide attempt -Tourette's  syndrome -weight loss -an unusual or allergic reaction to bupropion, other medicines, foods, dyes, or preservatives -breast-feeding -pregnant or trying to become pregnant How should I use this medicine? Take this medicine by mouth with a glass of water. Follow the directions on the prescription label. You can take it with or without food. If it upsets your stomach, take it with food. Do not cut, crush or chew this medicine. Take your medicine at  regular intervals. If you take this medicine more than once a day, take your second dose at least 8 hours after you take your first dose. To limit difficulty in sleeping, avoid taking this medicine at bedtime. Do not take your medicine more often than directed. Do not stop taking this medicine suddenly except upon the advice of your doctor. Stopping this medicine too quickly may cause serious side effects or your condition may worsen. A special MedGuide will be given to you by the pharmacist with each prescription and refill. Be sure to read this information carefully each time. Talk to your pediatrician regarding the use of this medicine in children. Special care may be needed. Overdosage: If you think you have taken too much of this medicine contact a poison control center or emergency room at once. NOTE: This medicine is only for you. Do not share this medicine with others. What if I miss a dose? If you miss a dose, skip the missed dose and take your next tablet at the regular time. There should be at least 8 hours between doses. Do not take double or extra doses. What may interact with this medicine? Do not take this medicine with any of the following medications: -linezolid -MAOIs like Azilect, Carbex, Eldepryl, Marplan, Nardil, and Parnate -methylene blue (injected into a vein) -other medicines that contain bupropion like Zyban This medicine may also interact with the following medications: -alcohol -certain medicines for anxiety or sleep -certain medicines for blood pressure like metoprolol, propranolol -certain medicines for depression or psychotic disturbances -certain medicines for HIV or AIDS like efavirenz, lopinavir, nelfinavir, ritonavir -certain medicines for irregular heart beat like propafenone, flecainide -certain medicines for Parkinson's disease like amantadine, levodopa -certain medicines for seizures like carbamazepine, phenytoin,  phenobarbital -cimetidine -clopidogrel -cyclophosphamide -digoxin -furazolidone -isoniazid -nicotine -orphenadrine -procarbazine -steroid medicines like prednisone or cortisone -stimulant medicines for attention disorders, weight loss, or to stay awake -tamoxifen -theophylline -thiotepa -ticlopidine -tramadol -warfarin This list may not describe all possible interactions. Give your health care provider a list of all the medicines, herbs, non-prescription drugs, or dietary supplements you use. Also tell them if you smoke, drink alcohol, or use illegal drugs. Some items may interact with your medicine. What should I watch for while using this medicine? Tell your doctor if your symptoms do not get better or if they get worse. Visit your doctor or health care professional for regular checks on your progress. Because it may take several weeks to see the full effects of this medicine, it is important to continue your treatment as prescribed by your doctor. Patients and their families should watch out for new or worsening thoughts of suicide or depression. Also watch out for sudden changes in feelings such as feeling anxious, agitated, panicky, irritable, hostile, aggressive, impulsive, severely restless, overly excited and hyperactive, or not being able to sleep. If this happens, especially at the beginning of treatment or after a change in dose, call your health care professional. Avoid alcoholic drinks while taking this medicine. Drinking excessive alcoholic beverages, using sleeping or anxiety medicines,  or quickly stopping the use of these agents while taking this medicine may increase your risk for a seizure. Do not drive or use heavy machinery until you know how this medicine affects you. This medicine can impair your ability to perform these tasks. Do not take this medicine close to bedtime. It may prevent you from sleeping. Your mouth may get dry. Chewing sugarless gum or sucking hard  candy, and drinking plenty of water may help. Contact your doctor if the problem does not go away or is severe. What side effects may I notice from receiving this medicine? Side effects that you should report to your doctor or health care professional as soon as possible: -allergic reactions like skin rash, itching or hives, swelling of the face, lips, or tongue -breathing problems -changes in vision -confusion -elevated mood, decreased need for sleep, racing thoughts, impulsive behavior -fast or irregular heartbeat -hallucinations, loss of contact with reality -increased blood pressure -redness, blistering, peeling or loosening of the skin, including inside the mouth -seizures -suicidal thoughts or other mood changes -unusually weak or tired -vomiting Side effects that usually do not require medical attention (report to your doctor or health care professional if they continue or are bothersome): -constipation -headache -loss of appetite -nausea -tremors -weight loss This list may not describe all possible side effects. Call your doctor for medical advice about side effects. You may report side effects to FDA at 1-800-FDA-1088. Where should I keep my medicine? Keep out of the reach of children. Store at room temperature between 20 and 25 degrees C (68 and 77 degrees F), away from direct sunlight and moisture. Keep tightly closed. Throw away any unused medicine after the expiration date. NOTE: This sheet is a summary. It may not cover all possible information. If you have questions about this medicine, talk to your doctor, pharmacist, or health care provider.  2018 Elsevier/Gold Standard (2016-04-26 13:52:19)

## 2018-08-07 LAB — HEMOGLOBIN A1C
Hgb A1c MFr Bld: 5.5 % of total Hgb (ref <5.7)
Mean Plasma Glucose: 111 (calc)
eAG (mmol/L): 6.2 (calc)

## 2018-08-07 LAB — CBC WITH DIFFERENTIAL/PLATELET
Basophils Absolute: 49 cells/uL (ref 0–200)
Basophils Relative: 0.7 %
Eosinophils Absolute: 252 cells/uL (ref 15–500)
Eosinophils Relative: 3.6 %
HCT: 39.6 % (ref 35.0–45.0)
Hemoglobin: 13.4 g/dL (ref 11.7–15.5)
Lymphs Abs: 2002 cells/uL (ref 850–3900)
MCH: 29.7 pg (ref 27.0–33.0)
MCHC: 33.8 g/dL (ref 32.0–36.0)
MCV: 87.8 fL (ref 80.0–100.0)
MPV: 10.1 fL (ref 7.5–12.5)
Monocytes Relative: 8 %
Neutro Abs: 4137 cells/uL (ref 1500–7800)
Neutrophils Relative %: 59.1 %
Platelets: 342 10*3/uL (ref 140–400)
RBC: 4.51 10*6/uL (ref 3.80–5.10)
RDW: 12.5 % (ref 11.0–15.0)
Total Lymphocyte: 28.6 %
WBC mixed population: 560 cells/uL (ref 200–950)
WBC: 7 10*3/uL (ref 3.8–10.8)

## 2018-08-07 LAB — COMPLETE METABOLIC PANEL WITH GFR
AG Ratio: 1.5 (calc) (ref 1.0–2.5)
ALT: 12 U/L (ref 6–29)
AST: 11 U/L (ref 10–30)
Albumin: 4.1 g/dL (ref 3.6–5.1)
Alkaline phosphatase (APISO): 87 U/L (ref 33–115)
BUN: 10 mg/dL (ref 7–25)
CO2: 25 mmol/L (ref 20–32)
Calcium: 9.9 mg/dL (ref 8.6–10.2)
Chloride: 104 mmol/L (ref 98–110)
Creat: 0.71 mg/dL (ref 0.50–1.10)
GFR, Est African American: 124 mL/min/{1.73_m2} (ref >=60)
GFR, Est Non African American: 107 mL/min/{1.73_m2} (ref >=60)
Globulin: 2.7 g/dL (calc) (ref 1.9–3.7)
Glucose, Bld: 98 mg/dL (ref 65–99)
Potassium: 4.5 mmol/L (ref 3.5–5.3)
Sodium: 141 mmol/L (ref 135–146)
Total Bilirubin: 0.4 mg/dL (ref 0.2–1.2)
Total Protein: 6.8 g/dL (ref 6.1–8.1)

## 2018-08-07 LAB — URINALYSIS W MICROSCOPIC + REFLEX CULTURE
Bacteria, UA: NONE SEEN /HPF
Bilirubin Urine: NEGATIVE
Glucose, UA: NEGATIVE
Hgb urine dipstick: NEGATIVE
Hyaline Cast: NONE SEEN /LPF
Ketones, ur: NEGATIVE
Nitrites, Initial: NEGATIVE
Protein, ur: NEGATIVE
RBC / HPF: NONE SEEN /HPF (ref 0–2)
Specific Gravity, Urine: 1.013 (ref 1.001–1.03)
Squamous Epithelial / LPF: NONE SEEN /HPF (ref <=5)
WBC, UA: NONE SEEN /HPF (ref 0–5)
pH: 7.5 (ref 5.0–8.0)

## 2018-08-07 LAB — URINE CULTURE
MICRO NUMBER:: 91125714
SPECIMEN QUALITY:: ADEQUATE

## 2018-08-07 LAB — TSH: TSH: 1.53 mIU/L

## 2018-08-07 LAB — CULTURE INDICATED

## 2018-11-04 ENCOUNTER — Other Ambulatory Visit: Payer: Self-pay | Admitting: Adult Health

## 2018-12-09 DIAGNOSIS — F4323 Adjustment disorder with mixed anxiety and depressed mood: Secondary | ICD-10-CM | POA: Diagnosis not present

## 2019-02-03 ENCOUNTER — Other Ambulatory Visit: Payer: Self-pay | Admitting: Adult Health

## 2019-02-03 DIAGNOSIS — E669 Obesity, unspecified: Secondary | ICD-10-CM | POA: Insufficient documentation

## 2019-02-03 NOTE — Progress Notes (Signed)
FOLLOW UP  Assessment and Plan:   [name] [name] was seen today for follow-up.  Diagnoses and all orders for this visit:  Situational stress Start new medication as prescribed; start lexapro 1/2 tab (10 mg) then increase to full tab in 2 weeks, has been on this medication previously and tolerated well Follow up in 2-3 months Continue follow up with Dr. Cyndia Skeeters Stress management techniques discussed, increase water, good sleep hygiene discussed, increase exercise, and increase veggies.  Follow up 1 month, call the office if any new AE's from medications and we will switch them             -     escitalopram (LEXAPRO) 20 MG tablet; Take 1 tablet (20 mg total) by mouth daily.  Thyroid antibody positive/ ? Mild thyroid enlargement/+ family hx of thyroid cancer ? Mild thyroid enlargement, hasn't had imaging; considering history will proceed with scheduling [place], continue monitoring TSH -     TSH -     US THYROID; Future  Vitamin D deficiency She has not been supplementing; restart supplement; suggested initiate 2000 IU then increase to 4000 IU if tolerating  Check vitamin D at CPE  Obesity (BMI 35.0-39.9 without comorbidity) Long discussion about weight loss, diet, and exercise Recommended diet heavy in fruits and veggies and low in animal meats, cheeses, and dairy products, appropriate calorie intake Patient will work on continue dietary changes, portion control, increase water intake, add exercise Discussed appropriate weight for height (below 150) and initial goal (185lb) Follow up at next visit  Continue diet and meds as discussed. Further disposition pending results of labs. Discussed med's effects and SE's.   Over 30 minutes of exam, counseling, chart review, and critical decision making was performed.   Future Appointments  Date Time Provider Department Center  08/09/2019  2:00 PM Judd Gaudier, NP [facility] None     ----------------------------------------------------------------------------------------------------------------------  HPI 38 y.o. female  presents for 6 month follow up on situational stress, obesity (BMI 35+), thyroid monitoring for history of + thyroid antibody, and vitamin D deficiency.   she has a diagnosis of situational depression/anxiety; she was on wellbutrin for mood and possible weight loss benefits and but reports she stopped taking due to forgetting to take frequently and not sure it was really helping her mood.  She is seeing a therapist Dr. Cyndia Skeeters. She shares he though getting back on SSRI may be more beneficial for her situation. She would like to get back on lexapro.   BMI is Body mass index is 35.18 kg/m., She is working on diet and exercise, has lost some weight since returning from [place]. She has been cooking more at home, trying to eat better, following lower carb, pushing fruits and vegetables, doing a lot of chicken/lean proteins, peanut butter, limited processed foods was going to start gym but then coronavirus outbreak happened. Didn't tolerate phentermine, difficult to get off topamax in past. She is cutting back on diet soda, drinking iced coffee, adding small amount of coffeemate. She admits to poor water intake, 1-2 glasses of water.  Wt Readings from Last 3 Encounters:  02/04/19 197 lb (89.4 kg)  08/05/18 206 lb 6.4 oz (93.6 kg)  05/13/17 196 lb (88.9 kg)   Today their BP is BP: 110/70  She does not workout. She denies chest pain, shortness of breath, dizziness.  She has hx of + thyroid antibody in the past, + fam hx of thyroid cancer and is monitored closely There is concern for mild thyromegaly  and recommended thyroid US but patient declined at last visit due to high deductible insurance plan, would like to proceed with this today.   Lab Results  Component Value Date   TSH 1.53 08/05/2018    Patient is not on Vitamin D supplement.   Lab Results   Component Value Date   VD25OH 32 11/05/2016       Current Medications:  Current Outpatient Medications on File Prior to Visit  Medication Sig  . buPROPion (WELLBUTRIN XL) 150 MG 24 hr tablet TAKE 1 TABLET(150 MG) BY MOUTH EVERY MORNING (Patient not taking: Reported on 02/04/2019)   No current facility-administered medications on file prior to visit.      Allergies:  Allergies  Allergen Reactions  . Other Other (See Comments)    steroid eye drops-causes glaucoma      Medical History:  Past Medical History:  Diagnosis Date  . Abnormal Pap smear 2002   Colpo and Laser HPV  . Former smoker   . Headache    migraines  . HSV-1 (herpes simplex virus 1) infection 4/11   positive on peri-anal c & s  . Hx of migraines 03/07/2016  . Obesity   . Vaginal Pap smear, abnormal    Family history- Reviewed and unchanged Social history- Reviewed and unchanged   Review of Systems:  Review of Systems  Constitutional: Negative for malaise/fatigue and weight loss.  HENT: Negative for hearing loss and tinnitus.   Eyes: Negative for blurred vision and double vision.  Respiratory: Negative for cough, shortness of breath and wheezing.   Cardiovascular: Negative for chest pain, palpitations, orthopnea, claudication and leg swelling.  Gastrointestinal: Negative for abdominal pain, blood in stool, constipation, diarrhea, heartburn, melena, nausea and vomiting.  Genitourinary: Negative.   Musculoskeletal: Negative for joint pain and myalgias.  Skin: Negative for rash.  Neurological: Negative for dizziness, tingling, sensory change, weakness and headaches.  Endo/Heme/Allergies: Negative for polydipsia.  Psychiatric/Behavioral: Positive for depression. Negative for substance abuse and suicidal ideas. The patient is not nervous/anxious and does not have insomnia.   All other systems reviewed and are negative.   Physical Exam: BP 110/70   Pulse (!) 56   Temp 97.7 F (36.5 C)   Ht 5' 2.75"  (1.594 m)   Wt 197 lb (89.4 kg)   SpO2 98%   BMI 35.18 kg/m  Wt Readings from Last 3 Encounters:  02/04/19 197 lb (89.4 kg)  08/05/18 206 lb 6.4 oz (93.6 kg)  05/13/17 196 lb (88.9 kg)   General Appearance: Well nourished, in no apparent distress. Eyes: PERRLA, EOMs, conjunctiva no swelling or erythema Sinuses: No Frontal/maxillary tenderness ENT/Mouth: Ext aud canals clear, TMs without erythema, bulging. No erythema, swelling, or exudate on post pharynx.  Tonsils not swollen or erythematous. Hearing normal.  Neck: Supple, thyroid palpable, ? mildly enlarged throughout without distinct palpable lumps Respiratory: Respiratory effort normal, BS equal bilaterally without rales, rhonchi, wheezing or stridor.  Cardio: RRR with no MRGs. Brisk peripheral pulses without edema.  Abdomen: Soft, + BS.  Non tender, no guarding, rebound, hernias, masses. Lymphatics: Non tender without lymphadenopathy.  Musculoskeletal: Full ROM, 5/5 strength, Normal gait Skin: Warm, dry without rashes, lesions, ecchymosis.  Neuro: Cranial nerves intact. No cerebellar symptoms.  Psych: Awake and oriented X 3, normal affect, Insight and Judgment appropriate.    Dan Maker, NP 11:04 AM [facility]

## 2019-02-04 ENCOUNTER — Ambulatory Visit (INDEPENDENT_AMBULATORY_CARE_PROVIDER_SITE_OTHER): Payer: BLUE CROSS/BLUE SHIELD | Admitting: Adult Health

## 2019-02-04 ENCOUNTER — Encounter: Payer: Self-pay | Admitting: Adult Health

## 2019-02-04 ENCOUNTER — Other Ambulatory Visit: Payer: Self-pay

## 2019-02-04 VITALS — BP 110/70 | HR 56 | Temp 97.7°F | Ht 62.75 in | Wt 197.0 lb

## 2019-02-04 DIAGNOSIS — Z113 Encounter for screening for infections with a predominantly sexual mode of transmission: Secondary | ICD-10-CM | POA: Diagnosis not present

## 2019-02-04 DIAGNOSIS — E049 Nontoxic goiter, unspecified: Secondary | ICD-10-CM | POA: Diagnosis not present

## 2019-02-04 DIAGNOSIS — R768 Other specified abnormal immunological findings in serum: Secondary | ICD-10-CM | POA: Diagnosis not present

## 2019-02-04 DIAGNOSIS — F439 Reaction to severe stress, unspecified: Secondary | ICD-10-CM | POA: Diagnosis not present

## 2019-02-04 DIAGNOSIS — Z6836 Body mass index (BMI) 36.0-36.9, adult: Secondary | ICD-10-CM | POA: Diagnosis not present

## 2019-02-04 DIAGNOSIS — Z01419 Encounter for gynecological examination (general) (routine) without abnormal findings: Secondary | ICD-10-CM | POA: Diagnosis not present

## 2019-02-04 DIAGNOSIS — E559 Vitamin D deficiency, unspecified: Secondary | ICD-10-CM

## 2019-02-04 DIAGNOSIS — E669 Obesity, unspecified: Secondary | ICD-10-CM

## 2019-02-04 DIAGNOSIS — Z124 Encounter for screening for malignant neoplasm of cervix: Secondary | ICD-10-CM | POA: Diagnosis not present

## 2019-02-04 DIAGNOSIS — Z808 Family history of malignant neoplasm of other organs or systems: Secondary | ICD-10-CM

## 2019-02-04 LAB — TSH: TSH: 1.43 mIU/L

## 2019-02-04 MED ORDER — ESCITALOPRAM OXALATE 20 MG PO TABS: 20.0000 mg | ORAL_TABLET | Freq: Every day | ORAL | 1 refills | Status: DC

## 2019-02-04 NOTE — Patient Instructions (Addendum)
Goals    . DIET - INCREASE WATER INTAKE     Aim for 65+ fluid ounces of clear fluids daily     . Exercise 3x per week (30 min per time)    . Weight (lb) < 185 lb (83.9 kg)        Get on 2000 IU vitamin D, can increase to 4000 if tolerating    Start lexapro 10 mg (1/2 tab) x 2 weeks then increase to full tab.   Message back in 2-3 months with progress.   Escitalopram tablets What is this medicine? ESCITALOPRAM (es sye TAL oh pram) is used to treat depression and certain types of anxiety. This medicine may be used for other purposes; ask your health care provider or pharmacist if you have questions. COMMON BRAND NAME(S): Lexapro What should I tell my health care provider before I take this medicine? They need to know if you have any of these conditions: -bipolar disorder or a family history of bipolar disorder -diabetes -glaucoma -heart disease -kidney or liver disease -receiving electroconvulsive therapy -seizures (convulsions) -suicidal thoughts, plans, or attempt by you or a family member -an unusual or allergic reaction to escitalopram, the related drug citalopram, other medicines, foods, dyes, or preservatives -pregnant or trying to become pregnant -breast-feeding How should I use this medicine? Take this medicine by mouth with a glass of water. Follow the directions on the prescription label. You can take it with or without food. If it upsets your stomach, take it with food. Take your medicine at regular intervals. Do not take it more often than directed. Do not stop taking this medicine suddenly except upon the advice of your doctor. Stopping this medicine too quickly may cause serious side effects or your condition may worsen. A special MedGuide will be given to you by the pharmacist with each prescription and refill. Be sure to read this information carefully each time. Talk to your pediatrician regarding the use of this medicine in children. Special care may be  needed. Overdosage: If you think you have taken too much of this medicine contact a poison control center or emergency room at once. NOTE: This medicine is only for you. Do not share this medicine with others. What if I miss a dose? If you miss a dose, take it as soon as you can. If it is almost time for your next dose, take only that dose. Do not take double or extra doses. What may interact with this medicine? Do not take this medicine with any of the following medications: -certain medicines for fungal infections like fluconazole, itraconazole, ketoconazole, posaconazole, voriconazole -cisapride -citalopram -dofetilide -dronedarone -linezolid -MAOIs like Carbex, Eldepryl, Marplan, Nardil, and Parnate -methylene blue (injected into a vein) -pimozide -thioridazine -ziprasidone This medicine may also interact with the following medications: -alcohol -amphetamines -aspirin and aspirin-like medicines -carbamazepine -certain medicines for depression, anxiety, or psychotic disturbances -certain medicines for migraine headache like almotriptan, eletriptan, frovatriptan, naratriptan, rizatriptan, sumatriptan, zolmitriptan -certain medicines for sleep -certain medicines that treat or prevent blood clots like warfarin, enoxaparin, dalteparin -cimetidine -diuretics -fentanyl -furazolidone -isoniazid -lithium -metoprolol -NSAIDs, medicines for pain and inflammation, like ibuprofen or naproxen -other medicines that prolong the QT interval (cause an abnormal heart rhythm) -procarbazine -rasagiline -supplements like St. John's wort, kava kava, valerian -tramadol -tryptophan This list may not describe all possible interactions. Give your health care provider a list of all the medicines, herbs, non-prescription drugs, or dietary supplements you use. Also tell them if you smoke, drink alcohol, or  use illegal drugs. Some items may interact with your medicine. What should I watch for while  using this medicine? Tell your doctor if your symptoms do not get better or if they get worse. Visit your doctor or health care professional for regular checks on your progress. Because it may take several weeks to see the full effects of this medicine, it is important to continue your treatment as prescribed by your doctor. Patients and their families should watch out for new or worsening thoughts of suicide or depression. Also watch out for sudden changes in feelings such as feeling anxious, agitated, panicky, irritable, hostile, aggressive, impulsive, severely restless, overly excited and hyperactive, or not being able to sleep. If this happens, especially at the beginning of treatment or after a change in dose, call your health care professional. Bonita Quin may get drowsy or dizzy. Do not drive, use machinery, or do anything that needs mental alertness until you know how this medicine affects you. Do not stand or sit up quickly, especially if you are an older patient. This reduces the risk of dizzy or fainting spells. Alcohol may interfere with the effect of this medicine. Avoid alcoholic drinks. Your mouth may get dry. Chewing sugarless gum or sucking hard candy, and drinking plenty of water may help. Contact your doctor if the problem does not go away or is severe. What side effects may I notice from receiving this medicine? Side effects that you should report to your doctor or health care professional as soon as possible: -allergic reactions like skin rash, itching or hives, swelling of the face, lips, or tongue -anxious -black, tarry stools -changes in vision -confusion -elevated mood, decreased need for sleep, racing thoughts, impulsive behavior -eye pain -fast, irregular heartbeat -feeling faint or lightheaded, falls -feeling agitated, angry, or irritable -hallucination, loss of contact with reality -loss of balance or coordination -loss of memory -painful or prolonged  erections -restlessness, pacing, inability to keep still -seizures -stiff muscles -suicidal thoughts or other mood changes -trouble sleeping -unusual bleeding or bruising -unusually weak or tired -vomiting Side effects that usually do not require medical attention (report to your doctor or health care professional if they continue or are bothersome): -changes in appetite -change in sex drive or performance -headache -increased sweating -indigestion, nausea -tremors This list may not describe all possible side effects. Call your doctor for medical advice about side effects. You may report side effects to FDA at 1-800-FDA-1088. Where should I keep my medicine? Keep out of reach of children. Store at room temperature between 15 and 30 degrees C (59 and 86 degrees F). Throw away any unused medicine after the expiration date. NOTE: This sheet is a summary. It may not cover all possible information. If you have questions about this medicine, talk to your doctor, pharmacist, or health care provider.  2019 Elsevier/Gold Standard (2016-04-08 13:20:23)

## 2019-03-10 ENCOUNTER — Other Ambulatory Visit: Payer: BLUE CROSS/BLUE SHIELD

## 2019-04-14 DIAGNOSIS — D229 Melanocytic nevi, unspecified: Secondary | ICD-10-CM | POA: Diagnosis not present

## 2019-04-14 DIAGNOSIS — L719 Rosacea, unspecified: Secondary | ICD-10-CM | POA: Diagnosis not present

## 2019-04-22 ENCOUNTER — Ambulatory Visit
Admission: RE | Admit: 2019-04-22 | Discharge: 2019-04-22 | Disposition: A | Discharge status: Home / Self Care | Payer: BC Managed Care – PPO | Source: Ambulatory Visit | Attending: Adult Health | Admitting: Adult Health

## 2019-04-22 DIAGNOSIS — E049 Nontoxic goiter, unspecified: Secondary | ICD-10-CM

## 2019-04-22 DIAGNOSIS — Z808 Family history of malignant neoplasm of other organs or systems: Secondary | ICD-10-CM

## 2019-04-22 DIAGNOSIS — R768 Other specified abnormal immunological findings in serum: Secondary | ICD-10-CM

## 2019-04-22 DIAGNOSIS — E041 Nontoxic single thyroid nodule: Secondary | ICD-10-CM | POA: Diagnosis not present

## 2019-04-23 ENCOUNTER — Other Ambulatory Visit: Payer: Self-pay | Admitting: Adult Health

## 2019-04-23 DIAGNOSIS — E010 Iodine-deficiency related diffuse (endemic) goiter: Secondary | ICD-10-CM

## 2019-04-23 DIAGNOSIS — R768 Other specified abnormal immunological findings in serum: Secondary | ICD-10-CM

## 2019-05-07 DIAGNOSIS — F4323 Adjustment disorder with mixed anxiety and depressed mood: Secondary | ICD-10-CM | POA: Diagnosis not present

## 2019-05-10 DIAGNOSIS — Z20828 Contact with and (suspected) exposure to other viral communicable diseases: Secondary | ICD-10-CM | POA: Diagnosis not present

## 2019-05-12 ENCOUNTER — Other Ambulatory Visit: Payer: Self-pay

## 2019-05-13 ENCOUNTER — Ambulatory Visit: Payer: BLUE CROSS/BLUE SHIELD | Admitting: Adult Health

## 2019-05-14 ENCOUNTER — Encounter: Payer: Self-pay | Admitting: Internal Medicine

## 2019-05-14 ENCOUNTER — Ambulatory Visit (INDEPENDENT_AMBULATORY_CARE_PROVIDER_SITE_OTHER): Payer: BC Managed Care – PPO | Admitting: Internal Medicine

## 2019-05-14 ENCOUNTER — Other Ambulatory Visit: Payer: Self-pay

## 2019-05-14 VITALS — BP 128/80 | HR 71 | Ht 62.75 in | Wt 201.0 lb

## 2019-05-14 DIAGNOSIS — E063 Autoimmune thyroiditis: Secondary | ICD-10-CM | POA: Diagnosis not present

## 2019-05-14 DIAGNOSIS — E010 Iodine-deficiency related diffuse (endemic) goiter: Secondary | ICD-10-CM

## 2019-05-14 NOTE — Progress Notes (Addendum)
Patient ID: [name] [name], female   DOB: Dec 20, 1980, 38 y.o.   MRN: [id number]    HPI  [name] [name] is a 38 y.o.-year-old female, referred by her PCP, [name] Comber, NP, for management of Hashimoto's thyroiditis and goiter.  Pt. has been dx with Hashimoto's thyroiditis (high TPO antibodies) in 2017; she did not require levothyroxine as her TFTs remain normal.  I reviewed pt's thyroid tests: Lab Results  Component Value Date   TSH 1.43 02/04/2019   TSH 1.53 08/05/2018   TSH 0.87 11/05/2016   TSH 1.302 10/25/2013   TSH 1.289 09/02/2012    TPO antibodies were elevated during her pregnancy: Component     Latest Ref Rng & Units 11/05/2016  Thyroglobulin Ab     <2 IU/mL <1  Thyroperoxidase Ab SerPl-aCnc     <9 IU/mL 139 (H)   Thyroid ultrasound (04/19/2019): Mildly heterogeneous and enlarged thyroid gland, without nodules: Parenchymal Echotexture: Mildly heterogenous Isthmus: 0.4 cm Right lobe: 4.6 x 1.7 x 1.6 cm Left lobe: 4.7 x 1.4 x 1.3 cm  Pt describes a long history of: - weight gain - gained 20 lbs when moved to [place] for 1 year, then moved back in 06/2018 lost 10 lbs. Lowest weight 160 lb. - fatigue - cold intolerance - depression/anxiety - on Lexapro.  - constipation - occasional - hair loss - occasional  Pt denies feeling nodules in neck, hoarseness, dysphagia/odynophagia, SOB with lying down.  She has no FH of thyroid disorders. + FH of thyroid cancer + M aunt  - had hemithyroidectomy. + FH of autoimmune ds - mother with psoriasis. Recent steroids - 3 days last week. No h/o radiation tx to head or neck. No recent use of iodine supplements.  Pt. also has a history of cholecystectomy, C-section with her son in 2017.  Therapist: Dr. Ardath [name].  ROS: Constitutional: + See HPI Eyes: no blurry vision, no xerophthalmia ENT: no sore throat,+ see HPI Cardiovascular: no CP/SOB/palpitations/leg swelling Respiratory: no cough/SOB Gastrointestinal: no N/V/D/ +  C Musculoskeletal: no muscle/joint aches Skin: no rashes Neurological: no tremors/numbness/tingling/dizziness, + history of headaches, resolved after pregnancy Psychiatric: + Both depression/anxiety  Past Medical History:  Diagnosis Date  . Abnormal Pap smear 2002   Colpo and Laser HPV  . Former smoker   . Headache    migraines  . HSV-1 (herpes simplex virus 1) infection 4/11   positive on peri-anal c & s  . Hx of migraines 03/07/2016  . Obesity   . Vaginal Pap smear, abnormal    Past Surgical History:  Procedure Laterality Date  . CESAREAN SECTION N/A 06/21/2016   Procedure: CESAREAN SECTION;  Surgeon: [name] Givens, MD;  Location: [place];  Service: Obstetrics;  Laterality: N/A;  . CHOLECYSTECTOMY, LAPAROSCOPIC  2004  . COLPOSCOPY  2002   HPV, laser  . TONSILLECTOMY AND ADENOIDECTOMY  2002   Social History   Socioeconomic History  . Marital status: Single    Spouse name: Not on file  . Number of children: 1  . Years of education: Not on file  . Highest education level: Not on file  Occupational History    Employer: [employer]; pharmacist  Tobacco Use  . Smoking status: Former Smoker    Quit date: 06/20/2003    Years since quitting: 15.9  . Smokeless tobacco: Never Used  . Tobacco comment: in college  Substance and Sexual Activity  . Alcohol use: No  . Drug use: No  . Sexual activity: Not Currently  Birth control/protection: None  Lifestyle  . Physical activity    Days per week: 2 days    Minutes per session: 30 min  . Stress: Only a little   Current Outpatient Medications on File Prior to Visit  Medication Sig Dispense Refill  . buPROPion (WELLBUTRIN XL) 150 MG 24 hr tablet TAKE 1 TABLET(150 MG) BY MOUTH EVERY MORNING 30 tablet 2  . escitalopram (LEXAPRO) 20 MG tablet Take 1 tablet (20 mg total) by mouth daily. 90 tablet 1   No current facility-administered medications on file prior to visit.    Allergies  Allergen Reactions  .  Other Other (See Comments)    steroid eye drops-causes glaucoma    Family History  Problem Relation Age of Onset  . Depression Mother   . Suicidality Mother   . Lung cancer Paternal Grandfather   . ALS Maternal Grandmother   . Breast cancer Maternal Grandmother [id number]  . Alzheimer's disease Maternal Grandfather   . Prostate cancer Maternal Grandfather   . Thyroid cancer Maternal Aunt     PE: BP 128/80   Pulse 71   Ht 5' 2.75" (1.594 m)   Wt 201 lb (91.2 kg)   SpO2 97%   BMI 35.89 kg/m  Wt Readings from Last 3 Encounters:  05/14/19 201 lb (91.2 kg)  02/04/19 197 lb (89.4 kg)  08/05/18 206 lb 6.4 oz (93.6 kg)   Constitutional: overweight, in NAD Eyes: PERRLA, EOMI, no exophthalmos ENT: moist mucous membranes, no thyromegaly detected on palpation, no cervical lymphadenopathy Cardiovascular: RRR, No MRG Respiratory: CTA B Gastrointestinal: abdomen soft, NT, ND, BS+ Musculoskeletal: no deformities, strength intact in all 4 Skin: moist, warm, no rashes Neurological: no tremor with outstretched hands, DTR normal in all 4  ASSESSMENT: 1. Hashimoto thyroiditis  2.  Thyromegaly  PLAN: 1. Hashimoto thyroiditis - I reviewed the images of her thyroid ultrasound report along with the patient.  The thyroid gland appears heterogeneous, indicative of thyroiditis.  In the presence of TPO antibodies and without a clear history of subacute thyroiditis, she most likely has Hashimoto's thyroiditis.  Pt does not have a personal history of RxTx to head/neck, but does have a relative (aunt) with thyroid cancer (unclear type). - we had a long discussion about her Hashimoto thyroiditis diagnosis. I explained that this is an autoimmune disorder, in which she develops antibodies against her own thyroid. The antibodies bind to the thyroid tissue and cause inflammation, and, eventually, destruction of the gland and hypothyroidism. We don't know how long this process can be, it can last from months to  years. As of now, based on the last results that I have, her thyroid tests are normal. We will repeat them in 1 week, however. I will also add thyroid antibody levels. - We discussed about treatment for Hashimoto thyroiditis, which is actually limited to thyroid hormones in case her TFTs are abnormal. Supplements like selenium has been tried with various results, some showing improvement in the TPO antibodies. However, there are no randomized controlled trials of this are consistent results between trials. We also discussed about ways to improve her immune system (stress reduction; relaxation; diet -discussed reduction of dairy, gluten, meat and increasing fruits and vegetables; exercise; sleep) to reduce the Ab titer and, subsequently, the thyroid inflammation. - We decided to check thyroid tests in 1 week (she just finished 3 days of prednisone) and have her return in 6 months for repeat.   2.  Thyromegaly -Reviewed together her thyroid ultrasound report:  Thyroid gland is slightly enlarged.  I do not feel this on palpation today. -I explained that thyroid enlargement especially at the beginning of her Hashimoto thyroiditis course has an inflammatory etiology and it is not uncommon, and it has a waxing and waning character.  -Depending on the results of the antibody titer, we may start selenium, which may help improving thyroid inflammation  Component     Latest Ref Rng & Units 05/24/2019  TSH     0.35 - 4.50 uIU/mL 1.31  Thyroglobulin Ab     < or = 1 IU/mL 1  Thyroperoxidase Ab SerPl-aCnc     <9 IU/mL 42 (H)  Triiodothyronine,Free,Serum     2.3 - 4.2 pg/mL 3.1  T4,Free(Direct)     0.60 - 1.60 ng/dL 1.610.69   TFTs are normal, excellent.  TPO antibodies have decreased.  Carlus Pavlovristina Jerrine Urschel, MD PhD [facility]

## 2019-05-14 NOTE — Patient Instructions (Signed)
Please com back for labs in 1 week.  If the antibodies are elevated, start Selenium 200 mcg daily.  Please come back for a follow-up appointment in 6 months.

## 2019-05-20 ENCOUNTER — Ambulatory Visit: Payer: BC Managed Care – PPO | Admitting: Adult Health

## 2019-05-20 NOTE — Progress Notes (Signed)
Assessment and Plan:  Situational stress Continue lexapro  Thyroid antibody positive -    Following with endocrinology, labs drawn today and declines further labs work  Class 1 obesity without serious comorbidity with body mass index (BMI) of 30.0 to 30.9 in adult, unspecified obesity type -     Suggest low gluten, low red meat, increase veggies -? Need to get celiac panel next OV, has history of bloating, has hashimotos  Right ear effusion -Allergy pill, flonase, autoinflation, explained no need for ABX at this time.  - if not better 4 weeks will refer to ENT   Future Appointments  Date Time Provider [place]  05/24/2019 11:45 AM [facility] None  08/09/2019  2:00 PM Liane Comber, NP [facility] None  11/15/2019  9:00 AM Philemon Kingdom, MD [facility] None     HPI 38 y.o.female presents for 3 month follow up. Her blood pressure has been controlled at home, today their BP is BP: 130/72  She states her right ear has been hurting on and off x 1 week. Last week has felt like there is water in it, keeps getting clogged. Will blow her nose and it will clear, she has allergies. She does nasal spray.  She just started on vitamin D, B12 and iron.  She is on topamax for back pain, migraines, and weight gain,  She is sleeping better with lexapro, on for stress/anxiety.  BMI is Body mass index is 36.28 kg/m., she is working on diet and exercise. Wt Readings from Last 3 Encounters:  05/24/19 203 lb 3.2 oz (92.2 kg)  05/14/19 201 lb (91.2 kg)  02/04/19 197 lb (89.4 kg)   Lab Results  Component Value Date   VITAMINB12 386 11/05/2016   Lab Results  Component Value Date   IRON 80 11/05/2016   TIBC 279 11/05/2016   FERRITIN 27 11/05/2016     Past Medical History:  Diagnosis Date  . Abnormal Pap smear 2002   Colpo and Laser HPV  . Former smoker   . Headache    migraines  . HSV-1 (herpes simplex virus 1) infection 4/11   positive on peri-anal c & s   . Hx of migraines 03/07/2016  . Obesity   . Vaginal Pap smear, abnormal      Allergies  Allergen Reactions  . Other Other (See Comments)    steroid eye drops-causes glaucoma     Current Outpatient Medications on File Prior to Visit  Medication Sig  . buPROPion (WELLBUTRIN XL) 150 MG 24 hr tablet TAKE 1 TABLET(150 MG) BY MOUTH EVERY MORNING  . escitalopram (LEXAPRO) 20 MG tablet Take 1 tablet (20 mg total) by mouth daily.   No current facility-administered medications on file prior to visit.     ROS: all negative except above.   Physical Exam: Filed Weights   05/24/19 1026  Weight: 203 lb 3.2 oz (92.2 kg)   BP 130/72   Pulse 81   Temp 97.7 F (36.5 C)   Ht 5' 2.75" (1.594 m)   Wt 203 lb 3.2 oz (92.2 kg)   LMP 05/03/2019   SpO2 98%   BMI 36.28 kg/m  General Appearance: Well nourished, in no apparent distress. Eyes: PERRLA, EOMs, conjunctiva no swelling or erythema Sinuses: No Frontal/maxillary tenderness ENT/Mouth: Ext aud canals clear, TMs without erythema, bulging. No erythema, swelling, or exudate on post pharynx.  Tonsils not swollen or erythematous. Hearing normal.  Neck: Supple, thyroid normal.  Respiratory: Respiratory effort normal, BS equal bilaterally  without rales, rhonchi, wheezing or stridor.  Cardio: RRR with no MRGs. Brisk peripheral pulses without edema.  Abdomen: Soft, + BS.  Non tender, no guarding, rebound, hernias, masses. Lymphatics: Non tender without lymphadenopathy.  Musculoskeletal: Full ROM, 5/5 strength, normal gait.  Skin: Warm, dry without rashes, lesions, ecchymosis.  Neuro: Cranial nerves intact. Normal muscle tone, no cerebellar symptoms. Sensation intact.  Psych: Awake and oriented X 3, normal affect, Insight and Judgment appropriate.     Quentin MullingAmanda Avin Gibbons, PA-C 10:54 AM [facility]

## 2019-05-24 ENCOUNTER — Encounter: Payer: Self-pay | Admitting: Physician Assistant

## 2019-05-24 ENCOUNTER — Ambulatory Visit: Payer: BC Managed Care – PPO | Admitting: Physician Assistant

## 2019-05-24 ENCOUNTER — Other Ambulatory Visit (INDEPENDENT_AMBULATORY_CARE_PROVIDER_SITE_OTHER): Payer: BC Managed Care – PPO

## 2019-05-24 ENCOUNTER — Other Ambulatory Visit: Payer: Self-pay

## 2019-05-24 DIAGNOSIS — E559 Vitamin D deficiency, unspecified: Secondary | ICD-10-CM | POA: Diagnosis not present

## 2019-05-24 DIAGNOSIS — R768 Other specified abnormal immunological findings in serum: Secondary | ICD-10-CM | POA: Diagnosis not present

## 2019-05-24 DIAGNOSIS — E063 Autoimmune thyroiditis: Secondary | ICD-10-CM

## 2019-05-24 LAB — T3, FREE: T3, Free: 3.1 pg/mL (ref 2.3–4.2)

## 2019-05-24 LAB — TSH: TSH: 1.31 u[IU]/mL (ref 0.35–4.50)

## 2019-05-24 LAB — T4, FREE: Free T4: 0.69 ng/dL (ref 0.60–1.60)

## 2019-05-24 NOTE — Patient Instructions (Addendum)
Xhance Your ears and sinuses are connected by the eustachian tube. When your sinuses are inflamed, this can close off the tube and cause fluid to collect in your middle ear. This can then cause dizziness, popping, clicking, ringing, and echoing in your ears. This is often NOT an infection and does NOT require antibiotics, it is caused by inflammation so the treatments help the inflammation. This can take a long time to get better so please be patient.  Here are things you can do to help with this: - Try the Flonase or Nasonex. Remember to spray each nostril twice towards the outer part of your eye.  Do not sniff but instead pinch your nose and tilt your head back to help the medicine get into your sinuses.  The best time to do this is at bedtime.Stop if you get blurred vision or nose bleeds.  -While drinking fluids, pinch and hold nose close and swallow, to help open eustachian tubes to drain fluid behind ear drums. -Please pick one of the over the counter allergy medications below and take it once daily for allergies.  It will also help with fluid behind ear drums. Claritin or loratadine cheapest but likely the weakest  Zyrtec or certizine at night because it can make you sleepy The strongest is allegra or fexafinadine  Cheapest at [employer], [employer], [employer] -can use decongestant over the counter, please do not use if you have high blood pressure or certain heart conditions.   if worsening HA, changes vision/speech, imbalance, weakness go to the ER    Intermittent fasting is more about strategy than starvation. It's meant to reset your body in different ways, hopefully with fitness and nutrition changes as a result.  Like any big switchover, though, results may vary when it comes down to the individual level. What works for your friends may not work for you, or vice versa. That's why it's helpful to play around with variations on intermittent fasting and healthy habits and find what works best for  you.  WHAT IS INTERMITTENT FASTING AND WHY DO IT?  Intermittent fasting doesn't involve specific foods, but rather, a strict schedule regarding when you eat. Also called "time-restricted eating," the tactic has been praised for its contribution to weight loss, improved body composition, and decreased cravings. Preliminary research also suggests it may be beneficial for glucose tolerance, hormone regulation, better muscle mass and lower body fat.  Part of its appeal is the simplicity of the effort. Unlike some other trends, there's no calculations to intermittent fasting.  You simply eat within a certain block of time, usually a window of 8-10 hours. In the other big block of time - about 14-16 hours, including when you're asleep - you don't eat anything, not even snacks. You can drink water, coffee, tea or any other beverage that doesn't have calories.  For example, if you like having a late dinner, you might skip breakfast and have your first meal at noon and your last meal of the day at 8 p.m., and then not eat until noon again the next day.  IDEAS FOR GETTING STARTED  If you're new to the strategy, it may be helpful to eat within the typical circadian rhythm and keep eating within daylight hours. This can be especially beneficial if you're looking at intermittent fasting for weight-loss goals.  So first try only eating between 12pm to 8pm.  Outside of this time you may have water, black coffee, and hot tea. You may not eat it drink  anything that has carbs, sugars, OR artificial sugars like diet soda.   Like any major eating and fitness shift, it can take time to find the perfect fit, so don't be afraid to experiment with different options - including ditching intermittent fasting altogether if it's simply not for you. But if it is, you may be surprised by some of the benefits that come along with the strategy.

## 2019-05-25 ENCOUNTER — Encounter: Payer: Self-pay | Admitting: Internal Medicine

## 2019-05-25 LAB — THYROID PEROXIDASE ANTIBODY: Thyroperoxidase Ab SerPl-aCnc: 42 IU/mL — ABNORMAL HIGH (ref <9)

## 2019-05-25 LAB — THYROGLOBULIN ANTIBODY: Thyroglobulin Ab: 1 IU/mL (ref <=1)

## 2019-06-14 ENCOUNTER — Other Ambulatory Visit: Payer: Self-pay | Admitting: Physician Assistant

## 2019-07-09 DIAGNOSIS — M79672 Pain in left foot: Secondary | ICD-10-CM | POA: Diagnosis not present

## 2019-08-05 NOTE — Progress Notes (Signed)
Complete Physical  Assessment and Plan:  Diagnoses and all orders for this visit:  Encounter for routine adult health examination with abnormal findings  Obesity - BMI 36 Long discussion about weight loss, diet, and exercise Recommended diet heavy in fruits and veggies and low in animal meats, cheeses, and dairy products, appropriate calorie intake Discussed appropriate weight for height and initial goal (190lb) She retried leftover phentermine and tolerating 1/2 tab; wants to retry; will refill when needed, monitor weights closely for benefit and progress; consider restarting low dose topamax but defer today and try phentermine only initially  Follow up at next visit  Thyroid antibody positive Monitor closely; following with Dr. Elvera Lennox as well per patient preference will be seeing annually Thyroid US showed heterogenous enlargement Defer TSH as had recently and high deductible insurance; recheck at 6 months OV  Situational stress Continue medications: lexapro 10 mg, wellbutrin 150 mg daily Followed by Dr. Cyndia Skeeters Stress management techniques discussed, increase water, good sleep hygiene discussed, increase exercise, and increase veggies.   History of HSV-1 infection Antivirals PRN  Vitamin D deficiency Start supplement, check levels next year  Screening for diabetes mellitus -     Hemoglobin A1c - defer, normal last year  Screening cholesterol level -     Lipid panel- defer, very well controlled at last check, very high deductible insurance, working on lifestyle/weight loss  Screening for hematuria or proteinuria -     Urinalysis w microscopic  Medication management -     CBC with Differential/Platelet -     COMPLETE METABOLIC PANEL WITH GFR  Screening cardiovascular condition       -     EKG obtained for baseline   High deductible, requesting bare minimum labs today-   Discussed med's effects and SE's. Screening labs and tests as requested with regular follow-up as  recommended. Over 40 minutes of exam, counseling, chart review, and complex, high level critical decision making was performed this visit.   Future Appointments  Date Time Provider Department Center  11/15/2019  9:00 AM Carlus Pavlov, MD [facility] None  08/08/2020  2:00 PM Judd Gaudier, NP [facility] None     HPI  38 y.o. female  presents for a complete physical and follow up for has Situational stress; Hashimoto's thyroiditis; History of HSV-1 infection; Vitamin D deficiency; Obesity (BMI 35.0-39.9 without comorbidity); and Thyromegaly on their problem list.   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   she is seeing a therapist Dr. Cyndia Skeeters q57m virtually.   She does have some difficulty staying asleep recently; has done well with melatonin in the past, wants to retry prior to trying any medications. Discussed possible trazodone today.    She has hx of + TPO antibodies since 2017, Hashimoto's thyroiditis with thyromegaly and recently following with Dr. Elvera Lennox per her preference.  Thyroid US 04/22/2019 - Mildly heterogeneous and enlarged thyroid gland without discrete thyroid nodule.  Component     Latest Ref Rng & Units 05/24/2019  Thyroperoxidase Ab SerPl-aCnc     <9 IU/mL 42 (H)  Thyroglobulin Ab     < or = 1 IU/mL 1  Triiodothyronine,Free,Serum     2.3 - 4.2 pg/mL 3.1  T4,Free(Direct)     0.60 - 1.60 ng/dL 2.97  TSH     9.89 - 2.11  uIU/mL 1.31    BMI is Body mass index is 36.93 kg/m., she is working on diet and exercise, has lost some weight since returning from [place]. Didn't tolerate phentermine in the past (HA with whole tab - however tried 1/2 tab recently and did well, would like to restart), difficult to get off high dose topamax in past.  No alcohol, 8 glasses of water  daily Does drink some diet soda  Wt Readings from Last 3 Encounters:  08/09/19 205 lb 3.2 oz (93.1 kg)  05/24/19 203 lb 3.2 oz (92.2 kg)  05/14/19 201 lb (91.2 kg)   Today their BP is BP: 110/60 She does workout, trying to be more active and walking with son. She denies chest pain, shortness of breath, dizziness.   The cholesterol last visit was:   Lab Results  Component Value Date   CHOL 144 11/05/2016   HDL 51 11/05/2016   LDLCALC 66 11/05/2016   TRIG 136 11/05/2016   CHOLHDL 2.8 11/05/2016   Last G9FA1C in the office was:  Lab Results  Component Value Date   HGBA1C 5.5 08/05/2018   Last GFR: Lab Results  Component Value Date   GFRNONAA 107 08/05/2018   She has hx of + thyroid antibody and is monitored closely:  Lab Results  Component Value Date   TSH 1.31 05/24/2019   Patient is not on Vitamin D supplement, recently started taking 2000 IU daily    Lab Results  Component Value Date   VD25OH 32 11/05/2016        Current Medications:  Current Outpatient Medications on File Prior to Visit  Medication Sig Dispense Refill  . buPROPion (WELLBUTRIN XL) 150 MG 24 hr tablet TAKE 1 TABLET BY MOUTH EVERY MORNING 30 tablet 2  . Cholecalciferol (VITAMIN D) 50 MCG (2000 UT) tablet Take 2,000 Units by mouth daily.    Marland Kitchen. escitalopram (LEXAPRO) 20 MG tablet Take 1 tablet (20 mg total) by mouth daily. 90 tablet 1  . phentermine 37.5 MG capsule Take 37.5 mg by mouth every morning.    . TURMERIC PO Take 500 mg by mouth daily.     No current facility-administered medications on file prior to visit.    Allergies:  Allergies  Allergen Reactions  . Other Other (See Comments)    steroid eye drops-causes glaucoma    Medical History:  She has Situational stress; Hashimoto's thyroiditis; History of HSV-1 infection; Vitamin D deficiency; Obesity (BMI 35.0-39.9 without comorbidity); and Thyromegaly on their problem list. Health Maintenance:   Immunization History  Administered Date(s)  Administered  . HPV Quadrivalent 01/10/2006, 03/11/2006, 07/10/2006  . Influenza-Unspecified 08/24/2018  . Tdap 06/19/2015    Tetanus: 2016 Flu vaccine: will get at work HPV: 3/3, 2007  LMP: Patient's last menstrual period was 07/29/2019 (approximate). Pap: 01/2019 at GYN, HPV neg, Dr. Normand Sloopillard, hx of abnormal in the past, s/p laser/colposcopy  MGM: due, will get via GYN next year DEXA: -  Thyroid US: 04/22/2019 - Mildly heterogeneous and enlarged thyroid gland without discrete thyroid nodule.  Colonoscopy: - EGD: -  Last Dental Exam: Dr. Coralie CommonBreshears in [place], 07/2018, will schedule Last Eye Exam: wears glasses, Dr. Donnella BiMartineq, 07/2018, has scheduled Last Derm Exam: Dr. Jorja Loaafeen, 03/2019, full body check   Patient Care Team: Lucky CowboyMcKeown, William, MD as PCP - General (Internal Medicine)  Surgical History:  She has a past surgical history that includes Cholecystectomy, laparoscopic (2004); Tonsillectomy and adenoidectomy (2002); Colposcopy (2002); and Cesarean section (N/A, 06/21/2016). Family History:  Herfamily history includes ALS in her maternal grandmother; Alzheimer's disease in her maternal grandfather; Breast cancer (age of onset: 80) in her maternal grandmother; Depression in her mother; Lung cancer in her paternal grandfather; Prostate cancer in her maternal grandfather; Suicidality in her mother; Thyroid cancer in her maternal aunt. Social History:  She reports that she quit smoking about 16 years ago. Her smoking use included cigarettes. She has a 0.60 pack-year smoking history. She has never used smokeless tobacco. She reports that she does not drink alcohol or use drugs.  Review of Systems: Review of Systems  Constitutional: Negative for malaise/fatigue and weight loss.  HENT: Negative for hearing loss and tinnitus.   Eyes: Negative for blurred vision and double vision.  Respiratory: Negative for cough, shortness of breath and wheezing.   Cardiovascular: Negative for chest pain,  palpitations, orthopnea, claudication and leg swelling.  Gastrointestinal: Negative for abdominal pain, blood in stool, constipation, diarrhea, heartburn, melena, nausea and vomiting.  Genitourinary: Negative.   Musculoskeletal: Negative for joint pain and myalgias.  Skin: Negative for rash.  Neurological: Negative for dizziness, tingling, sensory change, weakness and headaches.  Endo/Heme/Allergies: Negative for polydipsia.  Psychiatric/Behavioral: Negative for depression, substance abuse and suicidal ideas. The patient has insomnia (difficulty staying asleep). The patient is not nervous/anxious.   All other systems reviewed and are negative.   Physical Exam: Estimated body mass index is 36.93 kg/m as calculated from the following:   Height as of this encounter: 5' 2.5" (1.588 m).   Weight as of this encounter: 205 lb 3.2 oz (93.1 kg). BP 110/60   Pulse 63   Temp (!) 97.5 F (36.4 C)   Ht 5' 2.5" (1.588 m)   Wt 205 lb 3.2 oz (93.1 kg)   LMP 07/29/2019 (Approximate)   SpO2 99%   BMI 36.93 kg/m  General Appearance: Well nourished, in no apparent distress.  Eyes: PERRLA, EOMs, conjunctiva no swelling or erythema, normal fundi and vessels.  Sinuses: No Frontal/maxillary tenderness  ENT/Mouth: Ext aud canals clear, normal light reflex with TMs without erythema, bulging. Good dentition. No erythema, swelling, or exudate on post pharynx. Tonsils not swollen or erythematous. Hearing normal.  Neck: Supple, thyroid questionably mildly enlarged without palpable nodules. No bruits  Respiratory: Respiratory effort normal, BS equal bilaterally without rales, rhonchi, wheezing or stridor.  Cardio: RRR without murmurs, rubs or gallops. Brisk peripheral pulses without edema.  Chest: symmetric, with normal excursions and percussion.  Breasts: Patient declines; defer to GYN Abdomen: Soft, nontender, no guarding, rebound, hernias, masses, or organomegaly.  Lymphatics: Non tender without  lymphadenopathy.  Genitourinary: Defer to GYN Musculoskeletal: Full ROM all peripheral extremities,5/5 strength, and normal gait.  Skin: Warm, dry without rashes, lesions, ecchymosis. Neuro: Cranial nerves intact, reflexes equal bilaterally. Normal muscle tone, no cerebellar symptoms. Sensation intact.  Psych: Awake and oriented X 3, normal affect, Insight and Judgment appropriate.   EKG: Obtained for baseline - WNL with sinus bradycardia  Izora Ribas 2:33 PM [facility]

## 2019-08-09 ENCOUNTER — Other Ambulatory Visit: Payer: Self-pay

## 2019-08-09 ENCOUNTER — Encounter: Payer: Self-pay | Admitting: Adult Health

## 2019-08-09 ENCOUNTER — Other Ambulatory Visit: Payer: Self-pay | Admitting: Adult Health

## 2019-08-09 ENCOUNTER — Ambulatory Visit (INDEPENDENT_AMBULATORY_CARE_PROVIDER_SITE_OTHER): Payer: BC Managed Care – PPO | Admitting: Adult Health

## 2019-08-09 VITALS — BP 110/60 | HR 63 | Temp 97.5°F | Ht 62.5 in | Wt 205.2 lb

## 2019-08-09 DIAGNOSIS — Z0000 Encounter for general adult medical examination without abnormal findings: Secondary | ICD-10-CM | POA: Diagnosis not present

## 2019-08-09 DIAGNOSIS — Z1389 Encounter for screening for other disorder: Secondary | ICD-10-CM

## 2019-08-09 DIAGNOSIS — Z13220 Encounter for screening for lipoid disorders: Secondary | ICD-10-CM

## 2019-08-09 DIAGNOSIS — Z0001 Encounter for general adult medical examination with abnormal findings: Secondary | ICD-10-CM

## 2019-08-09 DIAGNOSIS — Z87891 Personal history of nicotine dependence: Secondary | ICD-10-CM

## 2019-08-09 DIAGNOSIS — I10 Essential (primary) hypertension: Secondary | ICD-10-CM

## 2019-08-09 DIAGNOSIS — E669 Obesity, unspecified: Secondary | ICD-10-CM

## 2019-08-09 DIAGNOSIS — Z9109 Other allergy status, other than to drugs and biological substances: Secondary | ICD-10-CM

## 2019-08-09 DIAGNOSIS — E010 Iodine-deficiency related diffuse (endemic) goiter: Secondary | ICD-10-CM

## 2019-08-09 DIAGNOSIS — E063 Autoimmune thyroiditis: Secondary | ICD-10-CM

## 2019-08-09 DIAGNOSIS — Z136 Encounter for screening for cardiovascular disorders: Secondary | ICD-10-CM | POA: Diagnosis not present

## 2019-08-09 DIAGNOSIS — E559 Vitamin D deficiency, unspecified: Secondary | ICD-10-CM

## 2019-08-09 DIAGNOSIS — B009 Herpesviral infection, unspecified: Secondary | ICD-10-CM

## 2019-08-09 DIAGNOSIS — F439 Reaction to severe stress, unspecified: Secondary | ICD-10-CM

## 2019-08-09 MED ORDER — ESCITALOPRAM OXALATE 10 MG PO TABS: 20.0000 mg | ORAL_TABLET | Freq: Every day | ORAL | 1 refills | Status: DC

## 2019-08-09 MED ORDER — MONTELUKAST SODIUM 10 MG PO TABS: 10.0000 mg | ORAL_TABLET | Freq: Every day | ORAL | 0 refills | Status: DC

## 2019-08-09 NOTE — Patient Instructions (Addendum)
Ms. Headrick , Thank you for taking time to come for your Annual Wellness Visit. I appreciate your ongoing commitment to your health goals. Please review the following plan we discussed and let me know if I can assist you in the future.   These are the goals we discussed: Goals    . DIET - INCREASE WATER INTAKE     Aim for 65+ fluid ounces of clear fluids daily     . Exercise 3x per week (30 min per time)    . Weight (lb) < 190 lb (86.2 kg)       This is a list of the screening recommended for you and due dates:  Health Maintenance  Topic Date Due  . Flu Shot  06/19/2019  . Pap Smear  12/21/2019  . Tetanus Vaccine  07/19/2025  . HIV Screening  Completed    Consider sublingual B12 - was low in 2017   Know what a healthy weight is for you (roughly BMI <25) and aim to maintain this  Aim for 7+ servings of fruits and vegetables daily  65-80+ fluid ounces of water or unsweet tea for healthy kidneys  Limit to max 1 drink of alcohol per day; avoid smoking/tobacco  Limit animal fats in diet for cholesterol and heart health - choose grass fed whenever available  Avoid highly processed foods, and foods high in saturated/trans fats  Aim for low stress - take time to unwind and care for your mental health  Aim for 150 min of moderate intensity exercise weekly for heart health, and weights twice weekly for bone health  Aim for 7-9 hours of sleep daily     Generally a diet heavy in fresh fruits and vegetables, lean protein, minimal processed foods in modest portions will lead to healthy weight loss   Drink 1/2 your body weight in fluid ounces of water daily; drink a tall glass of water 30 min before meals  Don't eat until you're stuffed- listen to your stomach and eat until you are 80% full   Try eating off of a salad plate; wait 10 min after finishing before going back for seconds  Start by eating the vegetables on your plate; aim for 50% of your meals to be fruits  or vegetables  Then eat your protein - lean meats (grass fed if possible), fish, beans, nuts in moderation  Eat your carbs/starch last ONLY if you still are hungry. If you can, stop before finishing it all  Avoid sugar and flour - the closer it looks to it's original form in nature, typically the better it is for you  Splurge in moderation - "assign" days when you get to splurge and have the "bad stuff" - I like to follow a 80% - 20% plan- "good" choices 80 % of the time, "bad" choices in moderation 20% of the time  Simple equation is: Calories out > calories in = weight loss - even if you eat the bad stuff, if you limit portions, you will still lose weight    Can try melatonin 5mg -15 mg at night for sleep, can also do benadryl 25-50mg  at night for sleep.   If this does not help we can try prescription medication. (trazodone, gabapentin, topamax may help)  Also here is some information about good sleep hygiene.   Insomnia Insomnia is frequent trouble falling and/or staying asleep. Insomnia can be a long term problem or a short term problem. Both are common. Insomnia can be a short term  problem when the wakefulness is related to a certain stress or worry. Long term insomnia is often related to ongoing stress during waking hours and/or poor sleeping habits. Overtime, sleep deprivation itself can make the problem worse. Every little thing feels more severe because you are overtired and your ability to cope is decreased. CAUSES   Stress, anxiety, and depression.  Poor sleeping habits.  Distractions such as TV in the bedroom.  Naps close to bedtime.  Engaging in emotionally charged conversations before bed.  Technical reading before sleep.  Alcohol and other sedatives. They may make the problem worse. They can hurt normal sleep patterns and normal dream activity.  Stimulants such as caffeine for several hours prior to bedtime.  Pain syndromes and shortness of breath can cause  insomnia.  Exercise late at night.  Changing time zones may cause sleeping problems (jet lag). It is sometimes helpful to have someone observe your sleeping patterns. They should look for periods of not breathing during the night (sleep apnea). They should also look to see how long those periods last. If you live alone or observers are uncertain, you can also be observed at a sleep clinic where your sleep patterns will be professionally monitored. Sleep apnea requires a checkup and treatment. Give your caregivers your medical history. Give your caregivers observations your family has made about your sleep.  SYMPTOMS   Not feeling rested in the morning.  Anxiety and restlessness at bedtime.  Difficulty falling and staying asleep. TREATMENT   Your caregiver may prescribe treatment for an underlying medical disorders. Your caregiver can give advice or help if you are using alcohol or other drugs for self-medication. Treatment of underlying problems will usually eliminate insomnia problems.  Medications can be prescribed for short time use. They are generally not recommended for lengthy use.  Over-the-counter sleep medicines are not recommended for lengthy use. They can be habit forming.  You can promote easier sleeping by making lifestyle changes such as:  Using relaxation techniques that help with breathing and reduce muscle tension.  Exercising earlier in the day.  Changing your diet and the time of your last meal. No night time snacks.  Establish a regular time to go to bed.  Counseling can help with stressful problems and worry.  Soothing music and white noise may be helpful if there are background noises you cannot remove.  Stop tedious detailed work at least one hour before bedtime. HOME CARE INSTRUCTIONS   Keep a diary. Inform your caregiver about your progress. This includes any medication side effects. See your caregiver regularly. Take note of:  Times when you are  asleep.  Times when you are awake during the night.  The quality of your sleep.  How you feel the next day. This information will help your caregiver care for you.  Get out of bed if you are still awake after 15 minutes. Read or do some quiet activity. Keep the lights down. Wait until you feel sleepy and go back to bed.  Keep regular sleeping and waking hours. Avoid naps.  Exercise regularly.  Avoid distractions at bedtime. Distractions include watching television or engaging in any intense or detailed activity like attempting to balance the household checkbook.  Develop a bedtime ritual. Keep a familiar routine of bathing, brushing your teeth, climbing into bed at the same time each night, listening to soothing music. Routines increase the success of falling to sleep faster.  Use relaxation techniques. This can be using breathing and muscle tension release  routines. It can also include visualizing peaceful scenes. You can also help control troubling or intruding thoughts by keeping your mind occupied with boring or repetitive thoughts like the old concept of counting sheep. You can make it more creative like imagining planting one beautiful flower after another in your backyard garden.  During your day, work to eliminate stress. When this is not possible use some of the previous suggestions to help reduce the anxiety that accompanies stressful situations. MAKE SURE YOU:   Understand these instructions.  Will watch your condition.  Will get help right away if you are not doing well or get worse. Document Released: 11/01/2000 Document Revised: 01/27/2012 Document Reviewed: 12/02/2007 [facility] Patient Information 2015 [place], [place]. This information is not intended to replace advice given to you by your health care provider. Make sure you discuss any questions you have with your health care provider.

## 2019-08-10 LAB — URINALYSIS, ROUTINE W REFLEX MICROSCOPIC
Bacteria, UA: NONE SEEN /HPF
Bilirubin Urine: NEGATIVE
Glucose, UA: NEGATIVE
Hgb urine dipstick: NEGATIVE
Hyaline Cast: NONE SEEN /LPF
Ketones, ur: NEGATIVE
Nitrite: NEGATIVE
Protein, ur: NEGATIVE
Specific Gravity, Urine: 1.012 (ref 1.001–1.03)
pH: 5.5 (ref 5.0–8.0)

## 2019-08-10 LAB — CBC WITH DIFFERENTIAL/PLATELET
Absolute Monocytes: 680 cells/uL (ref 200–950)
Basophils Absolute: 34 cells/uL (ref 0–200)
Basophils Relative: 0.4 %
Eosinophils Absolute: 247 cells/uL (ref 15–500)
Eosinophils Relative: 2.9 %
HCT: 40 % (ref 35.0–45.0)
Hemoglobin: 13.1 g/dL (ref 11.7–15.5)
Lymphs Abs: 2618 cells/uL (ref 850–3900)
MCH: 29.6 pg (ref 27.0–33.0)
MCHC: 32.8 g/dL (ref 32.0–36.0)
MCV: 90.3 fL (ref 80.0–100.0)
MPV: 10.1 fL (ref 7.5–12.5)
Monocytes Relative: 8 %
Neutro Abs: 4922 cells/uL (ref 1500–7800)
Neutrophils Relative %: 57.9 %
Platelets: 346 10*3/uL (ref 140–400)
RBC: 4.43 10*6/uL (ref 3.80–5.10)
RDW: 12.4 % (ref 11.0–15.0)
Total Lymphocyte: 30.8 %
WBC: 8.5 10*3/uL (ref 3.8–10.8)

## 2019-08-10 LAB — COMPLETE METABOLIC PANEL WITH GFR
AG Ratio: 1.5 (calc) (ref 1.0–2.5)
ALT: 10 U/L (ref 6–29)
AST: 11 U/L (ref 10–30)
Albumin: 4 g/dL (ref 3.6–5.1)
Alkaline phosphatase (APISO): 74 U/L (ref 31–125)
BUN: 13 mg/dL (ref 7–25)
CO2: 27 mmol/L (ref 20–32)
Calcium: 9.6 mg/dL (ref 8.6–10.2)
Chloride: 103 mmol/L (ref 98–110)
Creat: 0.83 mg/dL (ref 0.50–1.10)
GFR, Est African American: 102 mL/min/{1.73_m2} (ref >=60)
GFR, Est Non African American: 88 mL/min/{1.73_m2} (ref >=60)
Globulin: 2.6 g/dL (calc) (ref 1.9–3.7)
Glucose, Bld: 87 mg/dL (ref 65–99)
Potassium: 4.5 mmol/L (ref 3.5–5.3)
Sodium: 139 mmol/L (ref 135–146)
Total Bilirubin: 0.2 mg/dL (ref 0.2–1.2)
Total Protein: 6.6 g/dL (ref 6.1–8.1)

## 2019-08-20 DIAGNOSIS — F4323 Adjustment disorder with mixed anxiety and depressed mood: Secondary | ICD-10-CM | POA: Diagnosis not present

## 2019-09-15 ENCOUNTER — Other Ambulatory Visit: Payer: Self-pay | Admitting: Adult Health

## 2019-09-15 MED ORDER — TOPIRAMATE 25 MG PO TABS: ORAL_TABLET | ORAL | 2 refills | Status: DC

## 2019-09-15 MED ORDER — PHENTERMINE HCL 37.5 MG PO TABS: 37.5000 mg | ORAL_TABLET | Freq: Every day | ORAL | 0 refills | Status: DC

## 2019-09-15 MED ORDER — MELOXICAM 15 MG PO TABS: ORAL_TABLET | ORAL | 1 refills | Status: DC

## 2019-09-28 ENCOUNTER — Telehealth: Payer: BC Managed Care – PPO | Admitting: Physician Assistant

## 2019-09-28 ENCOUNTER — Other Ambulatory Visit: Payer: Self-pay

## 2019-09-28 DIAGNOSIS — R05 Cough: Secondary | ICD-10-CM

## 2019-09-28 DIAGNOSIS — Z20822 Contact with and (suspected) exposure to covid-19: Secondary | ICD-10-CM

## 2019-09-28 DIAGNOSIS — R059 Cough, unspecified: Secondary | ICD-10-CM

## 2019-09-28 MED ORDER — BENZONATATE 100 MG PO CAPS: 100.0000 mg | ORAL_CAPSULE | Freq: Three times a day (TID) | ORAL | 0 refills | Status: DC | PRN

## 2019-09-28 NOTE — Progress Notes (Signed)
E-Visit for Corona Virus Screening   Your current symptoms could be consistent with the coronavirus.  Many health care providers can now test patients at their office but not all are.  [facility] has multiple testing sites - you do not need an appointment or a lab order. For information on our COVID testing locations and hours go to [email or web address]  Please quarantine yourself while awaiting your test results.  We are enrolling you in our MyChart Home Montioring for COVID19 . Daily you will receive a questionnaire within the MyChart website. Our COVID 19 response team willl be monitoriing your responses daily.    COVID-19 is a respiratory illness with symptoms that are similar to the flu. Symptoms are typically mild to moderate, but there have been cases of severe illness and death due to the virus. The following symptoms may appear 2-14 days after exposure: . Fever . Cough . Shortness of breath or difficulty breathing . Chills . Repeated shaking with chills . Muscle pain . Headache . Sore throat . New loss of taste or smell . Fatigue . Congestion or runny nose . Nausea or vomiting . Diarrhea  It is vitally important that if you feel that you have an infection such as this virus or any other virus that you stay home and away from places where you may spread it to others.  You should self-quarantine for 14 days if you have symptoms that could potentially be coronavirus or have been in close contact a with a person diagnosed with COVID-19 within the last 2 weeks. You should avoid contact with people age 65 and older.   You should wear a mask or cloth face covering over your nose and mouth if you must be around other people or animals, including pets (even at home). Try to stay at least 6 feet away from other people. This will protect the people around you.  You can use medication such as A prescription cough medication called Tessalon Perles 100 mg. You may  take 1-2 capsules every 8 hours as needed for cough  You may also take acetaminophen (Tylenol) as needed for fever.   Reduce your risk of any infection by using the same precautions used for avoiding the common cold or flu:  . Wash your hands often with soap and warm water for at least 20 seconds.  If soap and water are not readily available, use an alcohol-based hand sanitizer with at least 60% alcohol.  . If coughing or sneezing, cover your mouth and nose by coughing or sneezing into the elbow areas of your shirt or coat, into a tissue or into your sleeve (not your hands). . Avoid shaking hands with others and consider head nods or verbal greetings only. . Avoid touching your eyes, nose, or mouth with unwashed hands.  . Avoid close contact with people who are sick. . Avoid places or events with large numbers of people in one location, like concerts or sporting events. . Carefully consider travel plans you have or are making. . If you are planning any travel outside or inside the [place], visit the CDC's Travelers' Health webpage for the latest health notices. . If you have some symptoms but not all symptoms, continue to monitor at home and seek medical attention if your symptoms worsen. . If you are having a medical emergency, call 911.  HOME CARE . Only take medications as instructed by your medical team. . Drink plenty of fluids and get plenty of rest. . A steam   or ultrasonic humidifier can help if you have congestion.   GET HELP RIGHT AWAY IF YOU HAVE EMERGENCY WARNING SIGNS** FOR COVID-19. If you or someone is showing any of these signs seek emergency medical care immediately. Call 911 or proceed to your closest emergency facility if: . You develop worsening high fever. . Trouble breathing . Bluish lips or face . Persistent pain or pressure in the chest . New confusion . Inability to wake or stay awake . You cough up blood. . Your symptoms become more severe  **This list is not all  possible symptoms. Contact your medical provider for any symptoms that are sever or concerning to you.   MAKE SURE YOU   Understand these instructions.  Will watch your condition.  Will get help right away if you are not doing well or get worse.  Your e-visit answers were reviewed by a board certified advanced clinical practitioner to complete your personal care plan.  Depending on the condition, your plan could have included both over the counter or prescription medications.  If there is a problem please reply once you have received a response from your provider.  Your safety is important to us.  If you have drug allergies check your prescription carefully.    You can use MyChart to ask questions about today's visit, request a non-urgent call back, or ask for a work or school excuse for 24 hours related to this e-Visit. If it has been greater than 24 hours you will need to follow up with your provider, or enter a new e-Visit to address those concerns. You will get an e-mail in the next two days asking about your experience.  I hope that your e-visit has been valuable and will speed your recovery. Thank you for using e-visits.   Greater than 5 minutes, yet less than 10 minutes of time have been spent researching, coordinating and implementing care for this patient today.   

## 2019-10-01 LAB — NOVEL CORONAVIRUS, NAA: SARS-CoV-2, NAA: NOT DETECTED

## 2019-11-15 ENCOUNTER — Other Ambulatory Visit: Payer: Self-pay | Admitting: Adult Health

## 2019-11-15 ENCOUNTER — Ambulatory Visit: Payer: BC Managed Care – PPO | Admitting: Internal Medicine

## 2019-12-03 ENCOUNTER — Other Ambulatory Visit: Payer: Self-pay

## 2019-12-07 ENCOUNTER — Ambulatory Visit (INDEPENDENT_AMBULATORY_CARE_PROVIDER_SITE_OTHER): Payer: 59 | Admitting: Internal Medicine

## 2019-12-07 ENCOUNTER — Other Ambulatory Visit (INDEPENDENT_AMBULATORY_CARE_PROVIDER_SITE_OTHER): Payer: 59

## 2019-12-07 ENCOUNTER — Encounter: Payer: Self-pay | Admitting: Internal Medicine

## 2019-12-07 VITALS — BP 120/82 | HR 85 | Ht 62.5 in | Wt 207.0 lb

## 2019-12-07 DIAGNOSIS — E010 Iodine-deficiency related diffuse (endemic) goiter: Secondary | ICD-10-CM | POA: Diagnosis not present

## 2019-12-07 DIAGNOSIS — E063 Autoimmune thyroiditis: Secondary | ICD-10-CM | POA: Diagnosis not present

## 2019-12-07 LAB — T4, FREE: Free T4: 0.84 ng/dL (ref 0.60–1.60)

## 2019-12-07 LAB — TSH: TSH: 1.31 u[IU]/mL (ref 0.35–4.50)

## 2019-12-07 LAB — T3, FREE: T3, Free: 3.1 pg/mL (ref 2.3–4.2)

## 2019-12-07 NOTE — Progress Notes (Signed)
Patient ID: [name] [name], female   DOB: [birth year], 39 y.o.   MRN: [id number]   This visit occurred during the SARS-CoV-2 public health emergency.  Safety protocols were in place, including screening questions prior to the visit, additional usage of staff PPE, and extensive cleaning of exam room while observing appropriate contact time as indicated for disinfecting solutions.   HPI  [name] [name] is a 39 y.o.-year-old female, initially referred by her PCP, Lucky Cowboy, MD, returning for follow-up for Hashimoto's thyroiditis and goiter.  Last visit 6 months ago.  She was diagnosed with Hashimoto's thyroiditis in 2017.  She is not requiring thyroid hormones yet.  We started Selenium 200 mcg daily in 05/2019. She does not feel different on this.  I reviewed her TFTs: Lab Results  Component Value Date   TSH 1.31 05/24/2019   TSH 1.43 02/04/2019   TSH 1.53 08/05/2018   TSH 0.87 11/05/2016   TSH 1.302 10/25/2013   TSH 1.289 09/02/2012   FREET4 0.69 05/24/2019    TPO antibodies were elevated during her pregnancy and still elevated at last visit: Component     Latest Ref Rng & Units 11/05/2016  Thyroglobulin Ab     <2 IU/mL <1  Thyroperoxidase Ab SerPl-aCnc     <9 IU/mL 139 (H)   Component     Latest Ref Rng & Units 05/24/2019  Thyroperoxidase Ab SerPl-aCnc     <9 IU/mL 42 (H)  Thyroglobulin Ab     < or = 1 IU/mL 1   Thyroid ultrasound (04/19/2019): Mildly heterogeneous and enlarged thyroid gland, without nodules: Parenchymal Echotexture: Mildly heterogenous Isthmus: 0.4 cm Right lobe: 4.6 x 1.7 x 1.6 cm Left lobe: 4.7 x 1.4 x 1.3 cm  She describes a long history of: - weight gain - gained 20 lbs when moved to [place] for 1 year, then moved back in 06/2018 lost 10 lbs. Lowest weight 160 lb. -Fatigue -Cold intolerance -Depression/anxiety- on Lexapro.  -Constipation- occasional -Hair loss- occasional  Pt denies: - feeling nodules in neck - hoarseness -  dysphagia - choking - SOB with lying down  She has no family history of thyroid disease but FH of thyroid cancer + M aunt  - had hemithyroidectomy. + FH of autoimmune ds -her mother has psoriasis.  No recent steroid use. No history of radiation therapy to head or neck.  No recent use of iodine supplements or biotin.  Pt. also has a history of cholecystectomy, C-section with her son in 2017.  Therapist: Dr. Cyndia Skeeters.  ROS: Constitutional: no weight gain/no weight loss, no fatigue, no subjective hyperthermia, no subjective hypothermia Eyes: no blurry vision, no xerophthalmia ENT: no sore throat, + see HPI, + postnasal drip Cardiovascular: no CP/no SOB/no palpitations/no leg swelling Respiratory: no cough/no SOB/no wheezing Gastrointestinal: no N/no V/no D/no C/+ acid reflux Musculoskeletal: no muscle aches/no joint aches Skin: no rashes, no hair loss Neurological: no tremors/no numbness/no tingling/no dizziness  I reviewed pt's medications, allergies, PMH, social hx, family hx, and changes were documented in the history of present illness. Otherwise, unchanged from my initial visit note.  Past Medical History:  Diagnosis Date  . Abnormal Pap smear 2002   Colpo and Laser HPV  . Former smoker   . Headache    migraines  . HSV-1 (herpes simplex virus 1) infection 4/11   positive on peri-anal c & s  . Hx of migraines 03/07/2016  . Obesity   . Vaginal Pap smear, abnormal    Past Surgical History:  Procedure Laterality Date  . CESAREAN SECTION N/A 06/21/2016   Procedure: CESAREAN SECTION;  Surgeon: Crawford Givens, MD;  Location: [facility];  Service: Obstetrics;  Laterality: N/A;  . CHOLECYSTECTOMY, LAPAROSCOPIC  2004  . COLPOSCOPY  2002   HPV, laser  . TONSILLECTOMY AND ADENOIDECTOMY  2002   Social History   Socioeconomic History  . Marital status: Single    Spouse name: Not on file  . Number of children: 1  . Years of education: Not on file  . Highest education level:  Not on file  Occupational History    Employer: [employer]; pharmacist  Tobacco Use  . Smoking status: Former Smoker    Quit date: 06/20/2003    Years since quitting: 15.9  . Smokeless tobacco: Never Used  . Tobacco comment: in college  Substance and Sexual Activity  . Alcohol use: No  . Drug use: No  . Sexual activity: Not Currently    Birth control/protection: None  Lifestyle  . Physical activity    Days per week: 2 days    Minutes per session: 30 min  . Stress: Only a little   Current Outpatient Medications on File Prior to Visit  Medication Sig Dispense Refill  . benzonatate (TESSALON) 100 MG capsule Take 1-2 capsules (100-200 mg total) by mouth 3 (three) times daily as needed for cough. 40 capsule 0  . Cholecalciferol (VITAMIN D) 50 MCG (2000 UT) tablet Take 2,000 Units by mouth daily.    Marland Kitchen escitalopram (LEXAPRO) 10 MG tablet Take 2 tablets (20 mg total) by mouth daily. 90 tablet 1  . meloxicam (MOBIC) 15 MG tablet Take 1/2 to 1 tablet Daily with Food for Pain & Inflammation 90 tablet 1  . montelukast (SINGULAIR) 10 MG tablet Take 1 tablet Daily for Allergies 90 tablet 3  . phentermine (ADIPEX-P) 37.5 MG tablet Take 1 tablet (37.5 mg total) by mouth daily before breakfast. Remember to take frequent drug breaks. 30 tablet 0  . topiramate (TOPAMAX) 25 MG tablet 1-2 at bedtime 60 tablet 2  . TURMERIC PO Take 500 mg by mouth daily.     No current facility-administered medications on file prior to visit.   Allergies  Allergen Reactions  . Other Other (See Comments)    steroid eye drops-causes glaucoma    Family History  Problem Relation Age of Onset  . Depression Mother   . Suicidality Mother   . Lung cancer Paternal Grandfather   . ALS Maternal Grandmother   . Breast cancer Maternal Grandmother 19  . Alzheimer's disease Maternal Grandfather   . Prostate cancer Maternal Grandfather   . Thyroid cancer Maternal Aunt    PE: Ht 5' 2.5" (1.588 m)   BMI 36.93  kg/m  Wt Readings from Last 3 Encounters:  08/09/19 205 lb 3.2 oz (93.1 kg)  05/24/19 203 lb 3.2 oz (92.2 kg)  05/14/19 201 lb (91.2 kg)   Constitutional: overweight, in NAD Eyes: PERRLA, EOMI, no exophthalmos ENT: moist mucous membranes, no thyromegaly, no cervical lymphadenopathy Cardiovascular: RRR, No MRG Respiratory: CTA B Gastrointestinal: abdomen soft, NT, ND, BS+ Musculoskeletal: no deformities, strength intact in all 4 Skin: moist, warm, no rashes Neurological: no tremor with outstretched hands, DTR normal in all 4  ASSESSMENT: 1. Hashimoto thyroiditis  2. Thyromegaly  PLAN: 1. Hashimoto thyroiditis -Patient has a history of elevated thyroid antibodies and also a thyroid gland that appeared heterogeneous, without nodules.  She does not have a personal history of radiation therapy to  head or neck. She does have a relative aunt with thyroid cancer (unclear type). -We again discussed that Hashimoto's thyroiditis is an autoimmune disorder with a fluctuating course, in which antibodies wax and wane.  The high antibodies will cause inflammation of the thyroid gland and the meantime, destruction of the gland with subsequent hypothyroidism.  However, so far, her TFTs have been normal, so she did not require levothyroxine treatment.  We do need to follow the TFTs trends, though. -We discussed at last visit about treatments for Hashimoto's thyroiditis to include improving her immune systems (stress reduction; relaxation; diet -discussed reduction of dairy, gluten, meat and increasing fruits and vegetables; exercise; sleep) and also selenium.  We started selenium in 05/2019.  She continues on this.  At last visit, her TPO antibodies were lower, but still elevated. - We will recheck her TFTs today along with her antibodies - RTC in 1 year but in 6 mo for labs  2.  Thyromegaly -We reviewed together her thyroid ultrasound report: Thyroid gland is slightly enlarged, without nodules.  Thyroid  enlargement is not felt on palpation today. -She denies neck compression symptoms, but occasionally she has dysphagia and sore throat (she has PND) -We discussed that thyroid enlargement especially at the beginning of her Hashimoto's thyroiditis is possible and related to the degree of inflammation in the gland  Appointment on 12/07/2019  Component Date Value Ref Range Status  . T3, Free 12/07/2019 3.1  2.3 - 4.2 pg/mL Final  . Free T4 12/07/2019 0.84  0.60 - 1.60 ng/dL Final   Comment: Specimens from patients who are undergoing biotin therapy and /or ingesting biotin supplements may contain high levels of biotin.  The higher biotin concentration in these specimens interferes with this Free T4 assay.  Specimens that contain high levels  of biotin may cause false high results for this Free T4 assay.  Please interpret results in light of the total clinical presentation of the patient.    Marland Kitchen TSH 12/07/2019 1.31  0.35 - 4.50 uIU/mL Final   TFTs are normal.    Component     Latest Ref Rng & Units 12/07/2019  TSH     0.35 - 4.50 uIU/mL 1.31  Thyroglobulin Ab     < or = 1 IU/mL <1  Thyroperoxidase Ab SerPl-aCnc     <9 IU/mL 36 (H)  Triiodothyronine,Free,Serum     2.3 - 4.2 pg/mL 3.1  T4,Free(Direct)     0.60 - 1.60 ng/dL 0.35  Since her thyroid antibodies continue to decrease, it is reasonable (but not mandatory) to continue selenium.  Carlus Pavlov, MD PhD [facility]

## 2019-12-07 NOTE — Patient Instructions (Signed)
Please stop at the lab lab.  Continue selenium 200 mcg daily.  Please return for labs in 6 months but for another visit in 1 year.

## 2019-12-08 LAB — THYROGLOBULIN ANTIBODY: Thyroglobulin Ab: <1 IU/mL (ref <=1)

## 2019-12-08 LAB — THYROID PEROXIDASE ANTIBODY: Thyroperoxidase Ab SerPl-aCnc: 36 IU/mL — ABNORMAL HIGH (ref <9)

## 2019-12-22 DIAGNOSIS — F4323 Adjustment disorder with mixed anxiety and depressed mood: Secondary | ICD-10-CM | POA: Diagnosis not present

## 2020-01-04 ENCOUNTER — Other Ambulatory Visit: Payer: Self-pay | Admitting: Adult Health

## 2020-01-13 ENCOUNTER — Other Ambulatory Visit: Payer: Self-pay | Admitting: Dermatology

## 2020-02-04 NOTE — Progress Notes (Signed)
FOLLOW UP  Assessment and Plan:   Obesity - BMI 37 Long discussion about weight loss, diet, and exercise Recommended diet heavy in fruits and veggies and low in animal meats, cheeses, and dairy products, appropriate calorie intake Discussed appropriate weight for height and initial goal (< 200lb) She does note benefit with phentermine and topamax She knows she needs to do better with lifestyle; plans to weigh weekly, increase water, meal prep, take healthy options with her to work, add exercise (suggested weights/resistance), get weekly accountability partner Follow up at next visit  Thyroid antibody positive Monitor closely; following with Dr. Elvera Lennox as well per patient preference will be seeing annually Thyroid US showed heterogenous enlargement Defer TSH as Dr. Elvera Lennox is following   Situational stress Continue medications: lexapro 10 mg  Followed by Dr. Cyndia Skeeters Stress management techniques discussed, increase water, good sleep hygiene discussed, increase exercise, and increase veggies.   History of HSV-1 infection Antivirals PRN  Vitamin D deficiency Continuet supplement, check levels at CPE   Continue diet and meds as discussed. Further disposition pending results of labs. Discussed med's effects and SE's.   Over 30 minutes of exam, counseling, chart review, and critical decision making was performed.   Future Appointments  Date Time Provider Department Center  03/14/2020  9:30 AM Janalyn Harder, MD [facility]  06/06/2020 10:00 AM [facility] None  08/08/2020  2:00 PM Judd Gaudier, NP [facility] None  12/04/2020  4:00 PM Carlus Pavlov, MD [facility] None    ----------------------------------------------------------------------------------------------------------------------  HPI 39 y.o. female  presents for 6 month follow up on obesity, vit D deficiency and anxiety.   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 she is seeing a therapist Dr. Cyndia Skeeters q64m virtually.    She has hx of + TPO antibodies since 2017, Hashimoto's thyroiditis with thyromegaly and recently following with Dr. Elvera Lennox per her preference.  Thyroid US 04/22/2019 - Mildly heterogeneous and enlarged thyroid gland without discrete thyroid nodule. She has been initiated on selenium 50 mcg daily.  Lab Results  Component Value Date   TSH 1.31 12/07/2019  .   she is prescribed phentermine and topamax for weight loss.  While on the medication they have lost 0 lbs since last visit. They deny palpitations, anxiety, trouble sleeping, elevated BP.   BMI is Body mass index is 37.26 kg/m., she is working on diet. Admits exercise has been limited to plans to start walking now that weather is warmer. She reports on home scale was down 3 lb on home scale but has been stressful last 5-6 weeks due to covid 19 vaccine and heavy work load. She does admit to not eating as well currently, snacking on bad things due to forgetting to pack healthy while at work. She does try to meal prep and have good options but needs to do more consistently.  She has increased water intake, 4-5 8 fluid ounce glasses.  Wt Readings from Last 3 Encounters:  02/07/20 207 lb (93.9 kg)  12/07/19 207 lb (93.9 kg)  08/09/19 205 lb 3.2 oz (93.1 kg)    Today their BP is BP: 118/72  She does workout. She denies chest pain, shortness of breath, dizziness.  Patient is on Vitamin D supplement.   Lab Results  Component Value Date   VD25OH 32 11/05/2016  Current Medications:  Current Outpatient Medications on File Prior to Visit  Medication Sig  . Ascorbic Acid (VITAMIN C PO) Take by mouth.  Marland Kitchen ascorbic acid (VITAMIN C) 500 MG tablet Take 500 mg by mouth daily.  . Cholecalciferol (VITAMIN D) 50 MCG (2000 UT) tablet Take 2,000 Units by mouth  daily.  . meloxicam (MOBIC) 15 MG tablet Take 1/2 to 1 tablet Daily with Food for Pain & Inflammation (Patient taking differently: as needed. Take 1/2 to 1 tablet Daily with Food for Pain & Inflammation)  . montelukast (SINGULAIR) 10 MG tablet Take 1 tablet Daily for Allergies  . phentermine (ADIPEX-P) 37.5 MG tablet TAKE 1 TABLET BY MOUTH DAILY BEFORE BREAKFAST. REMEMBER TO TAKE FREQUENT DRUG BREAKS  . selenium 50 MCG TABS tablet Take 50 mcg by mouth daily.  Marland Kitchen topiramate (TOPAMAX) 25 MG tablet 1-2 at bedtime  . Multiple Vitamins-Minerals (ZINC PO) Take by mouth.  . TURMERIC PO Take 500 mg by mouth daily.   No current facility-administered medications on file prior to visit.     Allergies:  Allergies  Allergen Reactions  . Other Other (See Comments)    steroid eye drops-causes glaucoma      Medical History:  Past Medical History:  Diagnosis Date  . Abnormal Pap smear 2002   Colpo and Laser HPV  . Former smoker   . Headache    migraines  . HSV-1 (herpes simplex virus 1) infection 4/11   positive on peri-anal c & s  . Hx of migraines 03/07/2016  . Obesity   . Vaginal Pap smear, abnormal    Family history- Reviewed and unchanged Social history- Reviewed and unchanged   Review of Systems:  Review of Systems  Constitutional: Negative for malaise/fatigue and weight loss.  HENT: Negative for hearing loss and tinnitus.   Eyes: Negative for blurred vision and double vision.  Respiratory: Negative for cough, shortness of breath and wheezing.   Cardiovascular: Negative for chest pain, palpitations, orthopnea, claudication and leg swelling.  Gastrointestinal: Negative for abdominal pain, blood in stool, constipation, diarrhea, heartburn, melena, nausea and vomiting.  Genitourinary: Negative.   Musculoskeletal: Negative for joint pain and myalgias.  Skin: Negative for rash.  Neurological: Negative for dizziness, tingling, sensory change, weakness and headaches.   Endo/Heme/Allergies: Negative for polydipsia.  Psychiatric/Behavioral: Negative.   All other systems reviewed and are negative.     Physical Exam: BP 118/72   Pulse 78   Temp 97.7 F (36.5 C)   Wt 207 lb (93.9 kg)   SpO2 98%   BMI 37.26 kg/m  Wt Readings from Last 3 Encounters:  02/07/20 207 lb (93.9 kg)  12/07/19 207 lb (93.9 kg)  08/09/19 205 lb 3.2 oz (93.1 kg)   General Appearance: Well nourished, in no apparent distress. Eyes: conjunctiva no swelling or erythema ENT/Mouth: Hearing normal.  Neck: Supple Respiratory: Respiratory effort normal, BS equal bilaterally without rales, rhonchi, wheezing or stridor.  Cardio: RRR with no MRGs. Brisk peripheral pulses without edema.  Abdomen: Soft, + BS.  Non tender, no guarding, rebound, hernias, masses. Lymphatics: Non tender without lymphadenopathy.  Musculoskeletal: Normal gait Skin: Warm, dry without rashes, lesions, ecchymosis.  Neuro: Cranial nerves intact. No cerebellar symptoms.  Psych: Awake and oriented X 3, normal affect, Insight and Judgment appropriate.    Izora Ribas, NP 11:26 AM [facility]

## 2020-02-07 ENCOUNTER — Other Ambulatory Visit: Payer: Self-pay

## 2020-02-07 ENCOUNTER — Ambulatory Visit (INDEPENDENT_AMBULATORY_CARE_PROVIDER_SITE_OTHER): Payer: 59 | Admitting: Adult Health

## 2020-02-07 ENCOUNTER — Encounter: Payer: Self-pay | Admitting: Adult Health

## 2020-02-07 VITALS — BP 118/72 | HR 78 | Temp 97.7°F | Wt 207.0 lb

## 2020-02-07 DIAGNOSIS — E559 Vitamin D deficiency, unspecified: Secondary | ICD-10-CM | POA: Diagnosis not present

## 2020-02-07 DIAGNOSIS — E669 Obesity, unspecified: Secondary | ICD-10-CM | POA: Diagnosis not present

## 2020-02-07 DIAGNOSIS — E063 Autoimmune thyroiditis: Secondary | ICD-10-CM

## 2020-02-07 DIAGNOSIS — F439 Reaction to severe stress, unspecified: Secondary | ICD-10-CM

## 2020-02-07 DIAGNOSIS — E010 Iodine-deficiency related diffuse (endemic) goiter: Secondary | ICD-10-CM | POA: Diagnosis not present

## 2020-02-07 MED ORDER — ESCITALOPRAM OXALATE 10 MG PO TABS: 10.0000 mg | ORAL_TABLET | Freq: Every day | ORAL | 1 refills | Status: DC

## 2020-02-07 NOTE — Patient Instructions (Addendum)
Goals    . DIET - INCREASE WATER INTAKE     Aim for 65+ fluid ounces of clear fluids daily     . Exercise 3x per week (30 min per time)    . Weight (lb) < 200 lb (90.7 kg)         Continue to try to eat better Commit to meal prep Increase intake water intake  Try adding weights/resistance bands  Try to find an accountability system - family/friends, try to weigh at least once a week   Schedule GYN/mammogram        Drink 1/2 your body weight in fluid ounces of water daily; drink a tall glass of water 30 min before meals  Don't eat until you're stuffed- listen to your stomach and eat until you are 80% full   Try eating off of a salad plate; wait 10 min after finishing before going back for seconds  Start by eating the vegetables on your plate; aim for 58% of your meals to be fruits or vegetables  Then eat your protein - lean meats (grass fed if possible), fish, beans, nuts in moderation  Eat your carbs/starch last ONLY if you still are hungry. If you can, stop before finishing it all  Avoid sugar and flour - the closer it looks to it's original form in nature, typically the better it is for you  Splurge in moderation - "assign" days when you get to splurge and have the "bad stuff" - I like to follow a 80% - 20% plan- "good" choices 80 % of the time, "bad" choices in moderation 20% of the time  Simple equation is: Calories out > calories in = weight loss - even if you eat the bad stuff, if you limit portions, you will still lose weight

## 2020-03-01 ENCOUNTER — Other Ambulatory Visit: Payer: Self-pay | Admitting: Adult Health

## 2020-03-01 MED ORDER — TOPIRAMATE 25 MG PO TABS: ORAL_TABLET | ORAL | 1 refills | Status: DC

## 2020-03-14 ENCOUNTER — Ambulatory Visit (INDEPENDENT_AMBULATORY_CARE_PROVIDER_SITE_OTHER): Payer: 59 | Admitting: Dermatology

## 2020-03-14 ENCOUNTER — Encounter: Payer: Self-pay | Admitting: Dermatology

## 2020-03-14 ENCOUNTER — Other Ambulatory Visit: Payer: Self-pay

## 2020-03-14 ENCOUNTER — Other Ambulatory Visit: Payer: Self-pay | Admitting: *Deleted

## 2020-03-14 DIAGNOSIS — L710 Perioral dermatitis: Secondary | ICD-10-CM

## 2020-03-14 NOTE — Progress Notes (Signed)
   Follow-Up Visit   Subjective  [name] [name] is a 39 y.o. female who presents for the following: Follow-up (Patient here today for follow up on left outer eye.  Also check the skin around her mouth, patient currently using Protopic. Area does clear but once patient stops using it the area becomes inflamed again).  rash Location: Right side left eye and around mouth Duration: Avril months Quality: Improved Associated Signs/Symptoms: Modifying Factors: Generic Protopic Severity:  Timing: Context:   The following portions of the chart were reviewed this encounter and updated as appropriate:    Definite improvement with topical tacrolimus but if [name] stops it for several days there is recurrent inflammation.  We discussed the safety of long-term topical tacrolimus.  She will be cautious during an upcoming trip to the beach with the possibility of some burning with sun exposure.  Should she either tire of using a topical or the rash become worse, I will phone in for oral minocycline 50 mg twice daily.  Side effects of this were discussed.  We will plan on next follow-up via MyChart or telephone in 6 weeks. Objective  Well appearing patient in no apparent distress; mood and affect are within normal limits.  A focused examination was performed including head and neck. Relevant physical exam findings are noted in the Assessment and Plan.   Assessment & Plan  periorificial dermaitis; continue protopic ointment

## 2020-03-18 ENCOUNTER — Encounter: Payer: Self-pay | Admitting: Dermatology

## 2020-04-22 ENCOUNTER — Other Ambulatory Visit: Payer: Self-pay | Admitting: Adult Health

## 2020-04-22 DIAGNOSIS — F439 Reaction to severe stress, unspecified: Secondary | ICD-10-CM

## 2020-04-24 ENCOUNTER — Other Ambulatory Visit: Payer: Self-pay | Admitting: Adult Health

## 2020-05-19 NOTE — Progress Notes (Signed)
FOLLOW UP  Assessment and Plan:   Obesity - BMI 35 Long discussion about weight loss, diet, and exercise Recommended diet heavy in fruits and veggies and low in animal meats, cheeses, and dairy products, appropriate calorie intake Discussed appropriate weight for height and initial goal (< 185lb) She does note benefit with phentermine and topamax Significant progress with improved lifestyle compliance  Follow up at next visit  Thyroid antibody positive Monitor closely; following with Dr. Elvera Lennox as well per patient preference will be seeing annually Thyroid US showed heterogenous enlargement Defer TSH as Dr. Elvera Lennox is following, scheduled in 2 weeks   Situational stress Continue medications: lexapro 10 mg  Followed by Dr. Cyndia Skeeters Stress management techniques discussed, increase water, good sleep hygiene discussed, increase exercise, and increase veggies.   History of HSV-1 infection Antivirals PRN  Vitamin D deficiency Continuet supplement, check levels at CPE    Continue diet and meds as discussed. Further disposition pending results of labs. Discussed med's effects and SE's.   Over 30 minutes of exam, counseling, chart review, and critical decision making was performed.   Future Appointments  Date Time Provider Department Center  06/06/2020 10:00 AM [facility] None  08/08/2020  2:00 PM Judd Gaudier, NP [facility] None  12/04/2020  4:00 PM Carlus Pavlov, MD [facility] None    ----------------------------------------------------------------------------------------------------------------------  HPI 39 y.o. female  presents for 6 month follow up on obesity, vit D deficiency and anxiety.   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   Was having some behavioral problems at work she has a diagnosis of situational depression/anxiety, recently on lexapro 10 mg daily with improvement.   She is seeing a therapist Dr.  Cyndia Skeeters q6w or so, back to seeing in office.    She has hx of + TPO antibodies since 2017, Hashimoto's thyroiditis with thyromegaly and recently following with Dr. Elvera Lennox per her preference, following q108m.  Thyroid US 04/22/2019 - Mildly heterogeneous and enlarged thyroid gland without discrete thyroid nodule. She has been initiated on selenium 200 mcg daily.  Lab Results  Component Value Date   TSH 1.31 12/07/2019   she is prescribed phentermine and topamax for weight loss.  While on the medication they have lost 0 lbs since last visit. They deny palpitations, anxiety, trouble sleeping, elevated BP.   BMI is Body mass index is 35.49 kg/m., she is working on diet, doing some intermittent fasting.  Has been more active in pool, doing light weights, 3-4 days a week.  She reports eating much better, doing some intermittent fasting, did hello fresh with some benefit, doing meal prep for work.  She has increased water intake, has cut down on diet soda (down from 4 to 1-2), doing more water  Did reach goal of <200 lb, working towards a goal of 165 lb.  Wt Readings from Last 3 Encounters:  05/23/20 197 lb 3.2 oz (89.4 kg)  02/07/20 207 lb (93.9 kg)  12/07/19 207 lb (93.9 kg)   Today their BP is BP: 110/68  She does workout. She denies chest pain, shortness of breath, dizziness.  Patient is on Vitamin D supplement, taking 2000 IU daily  Lab Results  Component Value Date   VD25OH 32 11/05/2016      Current Medications:  Current Outpatient Medications on File Prior to Visit  Medication Sig  . ascorbic acid (VITAMIN C) 500 MG tablet Take 500 mg by mouth daily.  . Cholecalciferol (VITAMIN D) 50  MCG (2000 UT) tablet Take 2,000 Units by mouth daily.  Marland Kitchen escitalopram (LEXAPRO) 10 MG tablet Take 1 tablet (10 mg total) by mouth daily.  . meloxicam (MOBIC) 15 MG tablet Take 1/2 to 1 tablet Daily with Food for Pain & Inflammation (Patient taking differently: as needed. Take 1/2 to 1 tablet Daily with  Food for Pain & Inflammation)  . montelukast (SINGULAIR) 10 MG tablet Take 1 tablet Daily for Allergies  . phentermine (ADIPEX-P) 37.5 MG tablet TAKE 1 TABLET BY MOUTH DAILY BEFORE BREAKFAST. REMEMBER TO TAKE FREQUENT DRUG BREAKS  . selenium 50 MCG TABS tablet Take 200 mcg by mouth daily.   . tacrolimus (PROTOPIC) 0.1 % ointment Apply 1 application topically 2 (two) times daily.  Marland Kitchen topiramate (TOPAMAX) 25 MG tablet Take 1 tab at dinner and 2 tabs at bedtime for weight. (Patient taking differently: 75 mg. Take 1 tab at dinner and 2 tabs at bedtime for weight.)  . clobetasol (OLUX) 0.05 % topical foam  (Patient not taking: Reported on 05/23/2020)  . Multiple Vitamins-Minerals (ZINC PO) Take by mouth. (Patient not taking: Reported on 05/23/2020)  . TURMERIC PO Take 500 mg by mouth daily. (Patient not taking: Reported on 05/23/2020)   No current facility-administered medications on file prior to visit.     Allergies:  Allergies  Allergen Reactions  . Other Other (See Comments)    steroid eye drops-causes glaucoma      Medical History:  Past Medical History:  Diagnosis Date  . Abnormal Pap smear 2002   Colpo and Laser HPV  . Former smoker   . Headache    migraines  . HSV-1 (herpes simplex virus 1) infection 4/11   positive on peri-anal c & s  . Hx of migraines 03/07/2016  . Obesity   . Vaginal Pap smear, abnormal    Family history- Reviewed and unchanged Social history- Reviewed and unchanged   Review of Systems:  Review of Systems  Constitutional: Negative for malaise/fatigue and weight loss.  HENT: Negative for hearing loss and tinnitus.   Eyes: Negative for blurred vision and double vision.  Respiratory: Negative for cough, shortness of breath and wheezing.   Cardiovascular: Negative for chest pain, palpitations, orthopnea, claudication and leg swelling.  Gastrointestinal: Negative for abdominal pain, blood in stool, constipation, diarrhea, heartburn, melena, nausea and vomiting.   Genitourinary: Negative.   Musculoskeletal: Positive for back pain (intermittent bil mid back pain, muscle spasms, seeing massage). Negative for joint pain and myalgias.  Skin: Negative for rash.  Neurological: Negative for dizziness, tingling, sensory change, weakness and headaches.  Endo/Heme/Allergies: Negative for polydipsia.  Psychiatric/Behavioral: Negative.   All other systems reviewed and are negative.   Physical Exam: BP 110/68   Pulse 74   Temp (!) 97.3 F (36.3 C)   Wt 197 lb 3.2 oz (89.4 kg)   SpO2 98%   BMI 35.49 kg/m  Wt Readings from Last 3 Encounters:  05/23/20 197 lb 3.2 oz (89.4 kg)  02/07/20 207 lb (93.9 kg)  12/07/19 207 lb (93.9 kg)   General Appearance: Well nourished, obese female in no apparent distress. Eyes: conjunctiva no swelling or erythema ENT/Mouth: Hearing normal.  Neck: Supple Respiratory: Respiratory effort normal, BS equal bilaterally without rales, rhonchi, wheezing or stridor.  Cardio: RRR with no MRGs. Brisk peripheral pulses without edema.  Abdomen: Soft, + BS.  Non tender, no guarding, rebound, hernias, masses. Lymphatics: Non tender without lymphadenopathy.  Musculoskeletal: Normal gait Skin: Warm, dry without rashes, lesions, ecchymosis.  Neuro: Cranial nerves intact. No cerebellar symptoms.  Psych: Awake and oriented X 3, normal affect, Insight and Judgment appropriate.    Dan Maker, NP 10:50 AM [facility]

## 2020-05-23 ENCOUNTER — Ambulatory Visit (INDEPENDENT_AMBULATORY_CARE_PROVIDER_SITE_OTHER): Payer: 59 | Admitting: Adult Health

## 2020-05-23 ENCOUNTER — Encounter: Payer: Self-pay | Admitting: Adult Health

## 2020-05-23 ENCOUNTER — Other Ambulatory Visit: Payer: Self-pay

## 2020-05-23 VITALS — BP 110/68 | HR 74 | Temp 97.3°F | Wt 197.2 lb

## 2020-05-23 DIAGNOSIS — E669 Obesity, unspecified: Secondary | ICD-10-CM | POA: Diagnosis not present

## 2020-05-23 DIAGNOSIS — E559 Vitamin D deficiency, unspecified: Secondary | ICD-10-CM | POA: Diagnosis not present

## 2020-05-23 DIAGNOSIS — E063 Autoimmune thyroiditis: Secondary | ICD-10-CM | POA: Diagnosis not present

## 2020-05-23 DIAGNOSIS — E010 Iodine-deficiency related diffuse (endemic) goiter: Secondary | ICD-10-CM

## 2020-05-23 DIAGNOSIS — F439 Reaction to severe stress, unspecified: Secondary | ICD-10-CM

## 2020-05-23 NOTE — Patient Instructions (Signed)
Goals    . DIET - INCREASE WATER INTAKE     Aim for 65+ fluid ounces of clear fluids daily     . Exercise 3x per week (30 min per time)    . Weight (lb) < 185 lb (83.9 kg)       High-Fiber Diet Fiber, also called dietary fiber, is a type of carbohydrate that is found in fruits, vegetables, whole grains, and beans. A high-fiber diet can have many health benefits. Your health care provider may recommend a high-fiber diet to help:  Prevent constipation. Fiber can make your bowel movements more regular.  Lower your cholesterol.  Relieve the following conditions: ? Swelling of veins in the anus (hemorrhoids). ? Swelling and irritation (inflammation) of specific areas of the digestive tract (uncomplicated diverticulosis). ? A problem of the large intestine (colon) that sometimes causes pain and diarrhea (irritable bowel syndrome, IBS).  Prevent overeating as part of a weight-loss plan.  Prevent heart disease, type 2 diabetes, and certain cancers. What is my plan? The recommended daily fiber intake in grams (g) includes:  38 g for men age 50 or younger.  30 g for men over age 27.  25 g for women age 49 or younger.  21 g for women over age 80. You can get the recommended daily intake of dietary fiber by:  Eating a variety of fruits, vegetables, grains, and beans.  Taking a fiber supplement, if it is not possible to get enough fiber through your diet. What do I need to know about a high-fiber diet?  It is better to get fiber through food sources rather than from fiber supplements. There is not a lot of research about how effective supplements are.  Always check the fiber content on the nutrition facts label of any prepackaged food. Look for foods that contain 5 g of fiber or more per serving.  Talk with a diet and nutrition specialist (dietitian) if you have questions about specific foods that are recommended or not recommended for your medical condition, especially if those foods  are not listed below.  Gradually increase how much fiber you consume. If you increase your intake of dietary fiber too quickly, you may have bloating, cramping, or gas.  Drink plenty of water. Water helps you to digest fiber. What are tips for following this plan?  Eat a wide variety of high-fiber foods.  Make sure that half of the grains that you eat each day are whole grains.  Eat breads and cereals that are made with whole-grain flour instead of refined flour or white flour.  Eat brown rice, bulgur wheat, or millet instead of white rice.  Start the day with a breakfast that is high in fiber, such as a cereal that contains 5 g of fiber or more per serving.  Use beans in place of meat in soups, salads, and pasta dishes.  Eat high-fiber snacks, such as berries, raw vegetables, nuts, and popcorn.  Choose whole fruits and vegetables instead of processed forms like juice or sauce. What foods can I eat?  Fruits Berries. Pears. Apples. Oranges. Avocado. Prunes and raisins. Dried figs. Vegetables Sweet potatoes. Spinach. Kale. Artichokes. Cabbage. Broccoli. Cauliflower. Green peas. Carrots. Squash. Grains Whole-grain breads. Multigrain cereal. Oats and oatmeal. Brown rice. Barley. Bulgur wheat. Millet. Quinoa. Bran muffins. Popcorn. Rye wafer crackers. Meats and other proteins Navy, kidney, and pinto beans. Soybeans. Split peas. Lentils. Nuts and seeds. Dairy Fiber-fortified yogurt. Beverages Fiber-fortified soy milk. Fiber-fortified orange juice. Other foods Fiber  bars. The items listed above may not be a complete list of recommended foods and beverages. Contact a dietitian for more options. What foods are not recommended? Fruits Fruit juice. Cooked, strained fruit. Vegetables Fried potatoes. Canned vegetables. Well-cooked vegetables. Grains White bread. Pasta made with refined flour. White rice. Meats and other proteins Fatty cuts of meat. Fried chicken or fried  fish. Dairy Milk. Yogurt. Cream cheese. Sour cream. Fats and oils Butters. Beverages Soft drinks. Other foods Cakes and pastries. The items listed above may not be a complete list of foods and beverages to avoid. Contact a dietitian for more information. Summary  Fiber is a type of carbohydrate. It is found in fruits, vegetables, whole grains, and beans.  There are many health benefits of eating a high-fiber diet, such as preventing constipation, lowering blood cholesterol, helping with weight loss, and reducing your risk of heart disease, diabetes, and certain cancers.  Gradually increase your intake of fiber. Increasing too fast can result in cramping, bloating, and gas. Drink plenty of water while you increase your fiber.  The best sources of fiber include whole fruits and vegetables, whole grains, nuts, seeds, and beans. This information is not intended to replace advice given to you by your health care provider. Make sure you discuss any questions you have with your health care provider. Document Revised: 09/08/2017 Document Reviewed: 09/08/2017 Elsevier Patient Education  2020 [employer].

## 2020-06-06 ENCOUNTER — Telehealth: Payer: Self-pay | Admitting: Internal Medicine

## 2020-06-06 ENCOUNTER — Other Ambulatory Visit: Payer: Self-pay

## 2020-06-06 ENCOUNTER — Other Ambulatory Visit (INDEPENDENT_AMBULATORY_CARE_PROVIDER_SITE_OTHER): Payer: 59

## 2020-06-06 ENCOUNTER — Other Ambulatory Visit: Payer: Self-pay | Admitting: Internal Medicine

## 2020-06-06 DIAGNOSIS — E063 Autoimmune thyroiditis: Secondary | ICD-10-CM

## 2020-06-06 DIAGNOSIS — E559 Vitamin D deficiency, unspecified: Secondary | ICD-10-CM

## 2020-06-06 LAB — T3, FREE: T3, Free: 2.8 pg/mL (ref 2.3–4.2)

## 2020-06-06 LAB — T4, FREE: Free T4: 0.89 ng/dL (ref 0.60–1.60)

## 2020-06-06 LAB — TSH: TSH: 1.32 u[IU]/mL (ref 0.35–4.50)

## 2020-06-06 NOTE — Telephone Encounter (Signed)
Penny at the lab has been notified of the add on.

## 2020-06-06 NOTE — Telephone Encounter (Signed)
OK, I added a vitamin D since she does have a history of vitamin D deficiency (even though I have not seen her for this problem in the past), but I could not add a B12 since I do not have a diagnosis to associated with it.

## 2020-06-06 NOTE — Telephone Encounter (Signed)
Patient is here waiting to get labs done - she asked if Dr Reece Agar could add B-12 and vitamin D to her labs to be checked.

## 2020-06-07 LAB — VITAMIN D 25 HYDROXY (VIT D DEFICIENCY, FRACTURES): Vit D, 25-Hydroxy: 31.2 ng/mL (ref 30.0–100.0)

## 2020-07-18 ENCOUNTER — Encounter: Payer: Self-pay | Admitting: Internal Medicine

## 2020-07-27 ENCOUNTER — Other Ambulatory Visit: Payer: Self-pay | Admitting: Adult Health

## 2020-07-27 MED ORDER — WEGOVY 0.25 MG/0.5ML ~~LOC~~ SOAJ: 0.2500 mg | SUBCUTANEOUS | 0 refills | Status: DC

## 2020-08-02 ENCOUNTER — Other Ambulatory Visit: Payer: Self-pay | Admitting: Critical Care Medicine

## 2020-08-02 ENCOUNTER — Other Ambulatory Visit: Payer: 59

## 2020-08-02 DIAGNOSIS — Z20822 Contact with and (suspected) exposure to covid-19: Secondary | ICD-10-CM

## 2020-08-04 LAB — NOVEL CORONAVIRUS, NAA: SARS-CoV-2, NAA: NOT DETECTED

## 2020-08-04 LAB — SARS-COV-2, NAA 2 DAY TAT

## 2020-08-08 ENCOUNTER — Encounter: Payer: BC Managed Care – PPO | Admitting: Adult Health

## 2020-08-11 ENCOUNTER — Other Ambulatory Visit: Payer: Self-pay | Admitting: Adult Health

## 2020-08-11 ENCOUNTER — Telehealth: Payer: Self-pay

## 2020-08-11 MED ORDER — WEGOVY 0.5 MG/0.5ML ~~LOC~~ SOAJ: 0.5000 mg | SUBCUTANEOUS | 0 refills | Status: DC

## 2020-08-11 NOTE — Telephone Encounter (Signed)
Patient states that the [name] samples are working well. Would like to increase the dosage. Requesting a prescription.

## 2020-08-17 ENCOUNTER — Telehealth: Payer: Self-pay

## 2020-08-17 NOTE — Telephone Encounter (Signed)
Prior Authorization for Reginal Lutes was denied.

## 2020-08-21 NOTE — Progress Notes (Signed)
Complete Physical  Assessment and Plan:  Diagnoses and all orders for this visit:  Encounter for routine adult health examination with abnormal findings  Obesity - BMI 36  Long discussion about weight loss, diet, and exercise Recommended diet heavy in fruits and veggies and low in animal meats, cheeses, and dairy products, appropriate calorie intake Discussed appropriate weight for height and initial goal (190lb) She is on topamax Wegovy 0.25 mg weekly with perceived benefit; working on prior [place]auth via insurance, local supply issue with higher dose; titrate up once available  Follow up at next visit  Hashimoto's thyroiditis Monitor closely; following with Dr. Elvera LennoxGherghe as well per patient preference will be seeing annually Thyroid US showed heterogenous enlargement Defer TSH to endocrine, q5162m  Situational stress Continue medications: lexapro 10 mg Followed by Dr. Cyndia SkeetersLurey Stress management techniques discussed, increase water, good sleep hygiene discussed, increase exercise, and increase veggies.   History of HSV-1 infection Antivirals PRN; use barrier with new partners  Vitamin D deficiency Start supplement, check levels next year  Screening for diabetes mellitus -     Hemoglobin A1c   Screening cholesterol level -     Lipid panel  Screening for hematuria or proteinuria -     Urinalysis w microscopic  Medication management -     CBC with Differential/Platelet -     COMPLETE METABOLIC PANEL WITH GFR  New sexual partner Check HIV/syphylis per patient request; declines GC/Chl, trich  High deductible, requesting selective labs - reviewed prior to ordering and in agreement with today's plan  Orders Placed This Encounter  Procedures  . RPR  . HIV Antibody (routine testing w rflx)  . Lipid panel  . VITAMIN D 25 Hydroxy (Vit-D Deficiency, Fractures)  . Hemoglobin A1c  . CBC with Differential/Platelet  . COMPLETE METABOLIC PANEL WITH GFR  . Urinalysis, Routine w reflex  microscopic    Discussed med's effects and SE's. Screening labs and tests as requested with regular follow-up as recommended. Over 40 minutes of exam, counseling, chart review, and complex, high level critical decision making was performed this visit.   Future Appointments  Date Time Provider Department Center  10/24/2020  9:30 AM Janalyn Harderafeen, Stuart, MD [facility]  12/04/2020  4:00 PM Carlus PavlovGherghe, Cristina, MD [facility] None  08/29/2021  3:00 PM Judd Gaudierorbett, Maytal Mijangos, NP [facility] None     HPI  39 y.o. female  presents for a complete physical and follow up for has Situational stress; Hashimoto's thyroiditis; History of HSV-1 infection; Vitamin D deficiency; Obesity (BMI 35.0-39.9 without comorbidity); and Thyromegaly on their problem list.   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 Has been dating new boyfriend, is using condoms, requesting HIV test today.   Follows with GYN Dr. Normand Sloopillard for PAPs.   she has a diagnosis of situational depression/anxiety, on lexapro 10 mg daily and doing well with this, helps with stress and racing thoughts. Uses melatonin occasionally.   she is seeing a therapist Dr. Cyndia SkeetersLurey q3862m virtually.    BMI is Body mass index is 36.25 kg/m., she is working on diet and exercise, has lost some weight since returning from [place]. Peak weight 232 lb in 06/2016, 207 lb in 01/2020 recently She is on phentermine 1/2 tab /topamax 75 mg for weight loss, also helps with headache difficult to get off high dose topamax in past.  Eating better Recently added wegovy, doing 0.25 mg weekly  Eating better, making better choices since wegovy,  doing better feeling satisfied Doing beach body videos 3 days a week, walking on treadmill No alcohol, 8 glasses of water daily Reduced diet soda - 2-3 cans/week Wt Readings from Last 3 Encounters:  08/23/20 195 lb (88.5 kg)  05/23/20 197 lb 3.2 oz (89.4 kg)  02/07/20 207 lb (93.9 kg)   Today  their BP is BP: 124/80 She does workout, trying to be more active and walking with son. She denies chest pain, shortness of breath, dizziness.   The cholesterol last visit was:   Lab Results  Component Value Date   CHOL 144 11/05/2016   HDL 51 11/05/2016   LDLCALC 66 11/05/2016   TRIG 136 11/05/2016   CHOLHDL 2.8 11/05/2016   Last H2R in the office was:  Lab Results  Component Value Date   HGBA1C 5.5 08/05/2018   Last GFR: Lab Results  Component Value Date   GFRNONAA 88 08/09/2019   She has hx of + thyroid antibody/Hashimoto's, had [place] 2020 without nodules, follows with Dr. Elvera Lennox:  Lab Results  Component Value Date   TSH 1.32 06/06/2020   Patient is on Vitamin D supplement, taking 2000 IU daily    Lab Results  Component Value Date   VD25OH 31.2 06/06/2020       Current Medications:  Current Outpatient Medications on File Prior to Visit  Medication Sig Dispense Refill  . ascorbic acid (VITAMIN C) 500 MG tablet Take 500 mg by mouth daily.    . Cholecalciferol (VITAMIN D) 50 MCG (2000 UT) tablet Take 2,000 Units by mouth daily.    Marland Kitchen escitalopram (LEXAPRO) 10 MG tablet Take 1 tablet (10 mg total) by mouth daily. 90 tablet 1  . meloxicam (MOBIC) 15 MG tablet Take 1/2 to 1 tablet Daily with Food for Pain & Inflammation (Patient taking differently: as needed. Take 1/2 to 1 tablet Daily with Food for Pain & Inflammation) 90 tablet 1  . montelukast (SINGULAIR) 10 MG tablet Take 1 tablet Daily for Allergies 90 tablet 3  . selenium 50 MCG TABS tablet Take 200 mcg by mouth daily.     . Semaglutide-Weight Management (WEGOVY) 0.5 MG/0.5ML SOAJ Inject 0.5 mg into the skin once a week. 2 mL 0  . tacrolimus (PROTOPIC) 0.1 % ointment Apply 1 application topically 2 (two) times daily.    Marland Kitchen topiramate (TOPAMAX) 25 MG tablet Take 1 tab at dinner and 2 tabs at bedtime for weight. (Patient taking differently: 75 mg. Take 1 tab at dinner and 2 tabs at bedtime for weight.) 270 tablet 1  .  phentermine (ADIPEX-P) 37.5 MG tablet TAKE 1 TABLET BY MOUTH DAILY BEFORE BREAKFAST. REMEMBER TO TAKE FREQUENT DRUG BREAKS (Patient not taking: Reported on 08/23/2020) 30 tablet 1   No current facility-administered medications on file prior to visit.   Allergies:  Allergies  Allergen Reactions  . Other Other (See Comments)    steroid eye drops-causes glaucoma    Medical History:  She has Situational stress; Hashimoto's thyroiditis; History of HSV-1 infection; Vitamin D deficiency; Obesity (BMI 35.0-39.9 without comorbidity); and Thyromegaly on their problem list. Health Maintenance:   Immunization History  Administered Date(s) Administered  . HPV Quadrivalent 01/10/2006, 03/11/2006, 07/10/2006  . Influenza-Unspecified 08/24/2018  . Tdap 06/19/2015    Tetanus: 2016 Flu vaccine: will get at work HPV: 3/3, 2007 Covid 19: 2/2,   LMP: No LMP recorded. Pap: 01/2019 at GYN, HPV neg, Dr. Normand Sloop, hx of abnormal in the past, s/p laser/colposcopy , getting q3y MGM: 2021, GYN  DEXA: -  Thyroid US: 04/22/2019 - Mildly heterogeneous and enlarged thyroid gland without discrete thyroid nodule.  Colonoscopy: - EGD: -  Last Dental Exam: Dr. Coralie Common in [employer] [employer], [place]  Last Eye Exam: wears glasses, Dr. Donnella Bi, 2021, glasses Last Derm Exam: Dr. Jorja Loa, 2021, full body check   Patient Care Team: Lucky Cowboy, MD as PCP - General (Internal Medicine) Janalyn Harder, MD as Consulting Physician (Dermatology)  Surgical History:  She has a past surgical history that includes Cholecystectomy, laparoscopic (2004); Tonsillectomy and adenoidectomy (2002); Colposcopy (2002); and Cesarean section (N/A, 06/21/2016). Family History:  Herfamily history includes ALS in her maternal grandmother; ALS (age of onset: 78) in her maternal uncle; Alzheimer's disease in her maternal grandfather; Breast cancer (age of onset: 49) in her maternal grandmother; Depression in her mother; Leukemia (age of onset: 61) in  her paternal aunt; Lung cancer in her paternal grandfather; Prostate cancer in her maternal grandfather; Suicidality in her mother; Thyroid cancer in her maternal aunt. Social History:  She reports that she quit smoking about 17 years ago. Her smoking use included cigarettes. She has a 0.60 pack-year smoking history. She has never used smokeless tobacco. She reports that she does not drink alcohol and does not use drugs.   Review of Systems: Review of Systems  Constitutional: Negative for malaise/fatigue and weight loss.  HENT: Negative for hearing loss and tinnitus.   Eyes: Negative for blurred vision and double vision.  Respiratory: Negative for cough, shortness of breath and wheezing.   Cardiovascular: Negative for chest pain, palpitations, orthopnea, claudication and leg swelling.  Gastrointestinal: Negative for abdominal pain, blood in stool, constipation, diarrhea, heartburn, melena, nausea and vomiting.  Genitourinary: Negative.   Musculoskeletal: Negative for joint pain and myalgias.  Skin: Negative for rash.  Neurological: Negative for dizziness, tingling, sensory change, weakness and headaches.  Endo/Heme/Allergies: Negative for polydipsia.  Psychiatric/Behavioral: Negative for depression, substance abuse and suicidal ideas. The patient is not nervous/anxious and does not have insomnia.   All other systems reviewed and are negative.   Physical Exam: Estimated body mass index is 36.25 kg/m as calculated from the following:   Height as of this encounter: 5' 1.5" (1.562 m).   Weight as of this encounter: 195 lb (88.5 kg). BP 124/80   Pulse 71   Temp (!) 97.5 F (36.4 C)   Ht 5' 1.5" (1.562 m)   Wt 195 lb (88.5 kg)   SpO2 98%   BMI 36.25 kg/m    General Appearance: Well nourished, in no apparent distress.  Eyes: PERRLA, EOMs, conjunctiva no swelling or erythema Sinuses: No Frontal/maxillary tenderness  ENT/Mouth: Ext aud canals clear, normal light reflex with TMs without  erythema, bulging. Good dentition. No erythema, swelling, or exudate on post pharynx. Tonsils not swollen or erythematous. Hearing normal.  Neck: Supple, thyroid questionably mildly enlarged without palpable nodules. No bruits  Respiratory: Respiratory effort normal, BS equal bilaterally without rales, rhonchi, wheezing or stridor.  Cardio: RRR without murmurs, rubs or gallops. Brisk peripheral pulses without edema.  Chest: symmetric, with normal excursions and percussion.  Breasts: defer to GYN  Abdomen: Soft, nontender, no guarding, rebound, hernias, masses, or organomegaly.  Lymphatics: Non tender without lymphadenopathy.  Genitourinary: Defer to GYN Musculoskeletal: Full ROM all peripheral extremities,5/5 strength, and normal gait.  Skin: Warm, dry without rashes, lesions, ecchymosis. Neuro: Cranial nerves intact, reflexes equal bilaterally. Normal muscle tone, no cerebellar symptoms. Sensation intact.  Psych: Awake and oriented X 3, normal affect, Insight and Judgment appropriate.  EKG: 07/2019  WNL with sinus bradycardia baseline in system, reviewed, defer  Dan Maker 3:39 PM [facility]

## 2020-08-23 ENCOUNTER — Encounter: Payer: Self-pay | Admitting: Adult Health

## 2020-08-23 ENCOUNTER — Ambulatory Visit (INDEPENDENT_AMBULATORY_CARE_PROVIDER_SITE_OTHER): Payer: 59 | Admitting: Adult Health

## 2020-08-23 ENCOUNTER — Other Ambulatory Visit: Payer: Self-pay

## 2020-08-23 VITALS — BP 124/80 | HR 71 | Temp 97.5°F | Ht 61.5 in | Wt 195.0 lb

## 2020-08-23 DIAGNOSIS — Z7251 High risk heterosexual behavior: Secondary | ICD-10-CM

## 2020-08-23 DIAGNOSIS — E063 Autoimmune thyroiditis: Secondary | ICD-10-CM

## 2020-08-23 DIAGNOSIS — E559 Vitamin D deficiency, unspecified: Secondary | ICD-10-CM

## 2020-08-23 DIAGNOSIS — R7309 Other abnormal glucose: Secondary | ICD-10-CM

## 2020-08-23 DIAGNOSIS — Z13220 Encounter for screening for lipoid disorders: Secondary | ICD-10-CM

## 2020-08-23 DIAGNOSIS — Z0001 Encounter for general adult medical examination with abnormal findings: Secondary | ICD-10-CM

## 2020-08-23 DIAGNOSIS — E669 Obesity, unspecified: Secondary | ICD-10-CM

## 2020-08-23 DIAGNOSIS — Z1389 Encounter for screening for other disorder: Secondary | ICD-10-CM

## 2020-08-23 DIAGNOSIS — E010 Iodine-deficiency related diffuse (endemic) goiter: Secondary | ICD-10-CM

## 2020-08-23 DIAGNOSIS — Z0000 Encounter for general adult medical examination without abnormal findings: Secondary | ICD-10-CM

## 2020-08-23 DIAGNOSIS — Z131 Encounter for screening for diabetes mellitus: Secondary | ICD-10-CM

## 2020-08-23 DIAGNOSIS — Z79899 Other long term (current) drug therapy: Secondary | ICD-10-CM

## 2020-08-23 DIAGNOSIS — F439 Reaction to severe stress, unspecified: Secondary | ICD-10-CM

## 2020-08-23 NOTE — Patient Instructions (Addendum)
  [name] , Thank you for taking time to come for your Annual Wellness Visit. I appreciate your ongoing commitment to your health goals. Please review the following plan we discussed and let me know if I can assist you in the future.   These are the goals we discussed: Goals    . DIET - INCREASE WATER INTAKE     Aim for 65+ fluid ounces of clear fluids daily     . Exercise 3x per week (30 min per time)    . Weight (lb) < 185 lb (83.9 kg)       This is a list of the screening recommended for you and due dates:  Health Maintenance  Topic Date Due  . Pap Smear  12/21/2019  . COVID-19 Vaccine (2 - Inadvertent mRNA 2-dose series) 04/06/2020  . Flu Shot  06/18/2020  . Tetanus Vaccine  07/19/2025  .  Hepatitis C: One time screening is recommended by Center for Disease Control  (CDC) for  adults born from [birth year] through 1967.   Completed  . HIV Screening  Completed    Could try naturium azelaic acid 10 mg emulsion -  Or the ordinary Azelaic 10 % suspension        Know what a healthy weight is for you (roughly BMI <25) and aim to maintain this  Aim for 7+ servings of fruits and vegetables daily  65-80+ fluid ounces of water or unsweet tea for healthy kidneys  Limit to max 1 drink of alcohol per day; avoid smoking/tobacco  Limit animal fats in diet for cholesterol and heart health - choose grass fed whenever available  Avoid highly processed foods, and foods high in saturated/trans fats  Aim for low stress - take time to unwind and care for your mental health  Aim for 150 min of moderate intensity exercise weekly for heart health, and weights twice weekly for bone health  Aim for 7-9 hours of sleep daily     A great goal to work towards is aiming to get in a serving daily of some of the most nutritionally dense foods - G- BOMBS daily

## 2020-08-24 ENCOUNTER — Telehealth: Payer: Self-pay | Admitting: Dermatology

## 2020-08-24 LAB — CBC WITH DIFFERENTIAL/PLATELET
Absolute Monocytes: 584 cells/uL (ref 200–950)
Basophils Absolute: 29 cells/uL (ref 0–200)
Basophils Relative: 0.4 %
Eosinophils Absolute: 102 cells/uL (ref 15–500)
Eosinophils Relative: 1.4 %
HCT: 42.9 % (ref 35.0–45.0)
Hemoglobin: 14.3 g/dL (ref 11.7–15.5)
Lymphs Abs: 1759 cells/uL (ref 850–3900)
MCH: 30.8 pg (ref 27.0–33.0)
MCHC: 33.3 g/dL (ref 32.0–36.0)
MCV: 92.3 fL (ref 80.0–100.0)
MPV: 10 fL (ref 7.5–12.5)
Monocytes Relative: 8 %
Neutro Abs: 4825 cells/uL (ref 1500–7800)
Neutrophils Relative %: 66.1 %
Platelets: 354 10*3/uL (ref 140–400)
RBC: 4.65 10*6/uL (ref 3.80–5.10)
RDW: 12.6 % (ref 11.0–15.0)
Total Lymphocyte: 24.1 %
WBC: 7.3 10*3/uL (ref 3.8–10.8)

## 2020-08-24 LAB — URINALYSIS, ROUTINE W REFLEX MICROSCOPIC
Bilirubin Urine: NEGATIVE
Glucose, UA: NEGATIVE
Hgb urine dipstick: NEGATIVE
Leukocytes,Ua: NEGATIVE
Nitrite: NEGATIVE
Protein, ur: NEGATIVE
Specific Gravity, Urine: 1.022 (ref 1.001–1.03)
pH: 5.5 (ref 5.0–8.0)

## 2020-08-24 LAB — COMPLETE METABOLIC PANEL WITH GFR
AG Ratio: 1.5 (calc) (ref 1.0–2.5)
ALT: 10 U/L (ref 6–29)
AST: 13 U/L (ref 10–30)
Albumin: 4.3 g/dL (ref 3.6–5.1)
Alkaline phosphatase (APISO): 67 U/L (ref 31–125)
BUN: 17 mg/dL (ref 7–25)
CO2: 27 mmol/L (ref 20–32)
Calcium: 9.4 mg/dL (ref 8.6–10.2)
Chloride: 104 mmol/L (ref 98–110)
Creat: 0.8 mg/dL (ref 0.50–1.10)
GFR, Est African American: 106 mL/min/{1.73_m2} (ref >=60)
GFR, Est Non African American: 92 mL/min/{1.73_m2} (ref >=60)
Globulin: 2.9 g/dL (calc) (ref 1.9–3.7)
Glucose, Bld: 80 mg/dL (ref 65–99)
Potassium: 3.8 mmol/L (ref 3.5–5.3)
Sodium: 138 mmol/L (ref 135–146)
Total Bilirubin: 0.4 mg/dL (ref 0.2–1.2)
Total Protein: 7.2 g/dL (ref 6.1–8.1)

## 2020-08-24 LAB — HEMOGLOBIN A1C
Hgb A1c MFr Bld: 5.2 % of total Hgb (ref <5.7)
Mean Plasma Glucose: 103 (calc)
eAG (mmol/L): 5.7 (calc)

## 2020-08-24 LAB — LIPID PANEL
Cholesterol: 162 mg/dL (ref <200)
HDL: 59 mg/dL (ref >=50)
LDL Cholesterol (Calc): 83 mg/dL (calc)
Non-HDL Cholesterol (Calc): 103 mg/dL (calc) (ref <130)
Total CHOL/HDL Ratio: 2.7 (calc) (ref <5.0)
Triglycerides: 104 mg/dL (ref <150)

## 2020-08-24 LAB — VITAMIN D 25 HYDROXY (VIT D DEFICIENCY, FRACTURES): Vit D, 25-Hydroxy: 53 ng/mL (ref 30–100)

## 2020-08-24 LAB — RPR: RPR Ser Ql: NONREACTIVE

## 2020-08-24 LAB — HIV ANTIBODY (ROUTINE TESTING W REFLEX): HIV 1&2 Ab, 4th Generation: NONREACTIVE

## 2020-08-24 NOTE — Telephone Encounter (Signed)
Patient calling to ask if ST will send Rx for low dose Doxy to [facility] on [address] in [place]. She states that they spoke about this at previous visit and she was told to call and let him know if she wanted to try it. She has a follow up appointment scheduled in December.

## 2020-08-25 NOTE — Telephone Encounter (Signed)
Dr. Karie Schwalbe actually spoke with her about low-dose minocycline rather than doxycycline. Okay to prescribe 50 mg minocycline No. 60 one p.o. twice daily. 1 refill.

## 2020-08-28 MED ORDER — MINOCYCLINE HCL 50 MG PO CAPS: 50.0000 mg | ORAL_CAPSULE | Freq: Every day | ORAL | 1 refills | Status: DC

## 2020-08-28 NOTE — Telephone Encounter (Signed)
Left patient a message saying that we sent in the prescription for her Minocycline 50mg  # 60 that she spoke with Dr. about. If she has any questions or concerns she can call Jorja Loa back.

## 2020-08-31 ENCOUNTER — Telehealth: Payer: Self-pay

## 2020-08-31 NOTE — Telephone Encounter (Signed)
[facility] prior authorization approved and patient was notified.

## 2020-09-23 ENCOUNTER — Other Ambulatory Visit: Payer: Self-pay | Admitting: Adult Health

## 2020-09-23 MED ORDER — WEGOVY 0.5 MG/0.5ML ~~LOC~~ SOAJ: 0.5000 mg | SUBCUTANEOUS | 0 refills | Status: DC

## 2020-09-25 ENCOUNTER — Other Ambulatory Visit: Payer: Self-pay | Admitting: Adult Health

## 2020-10-24 ENCOUNTER — Other Ambulatory Visit: Payer: Self-pay

## 2020-10-24 ENCOUNTER — Ambulatory Visit (INDEPENDENT_AMBULATORY_CARE_PROVIDER_SITE_OTHER): Payer: 59 | Admitting: Dermatology

## 2020-10-24 ENCOUNTER — Encounter: Payer: Self-pay | Admitting: Dermatology

## 2020-10-24 DIAGNOSIS — L821 Other seborrheic keratosis: Secondary | ICD-10-CM | POA: Diagnosis not present

## 2020-10-24 DIAGNOSIS — L918 Other hypertrophic disorders of the skin: Secondary | ICD-10-CM

## 2020-10-24 DIAGNOSIS — L710 Perioral dermatitis: Secondary | ICD-10-CM | POA: Diagnosis not present

## 2020-10-27 ENCOUNTER — Other Ambulatory Visit: Payer: Self-pay | Admitting: Adult Health

## 2020-11-02 ENCOUNTER — Other Ambulatory Visit: Payer: Self-pay | Admitting: Adult Health

## 2020-11-02 DIAGNOSIS — F439 Reaction to severe stress, unspecified: Secondary | ICD-10-CM

## 2020-11-05 ENCOUNTER — Encounter: Payer: Self-pay | Admitting: Dermatology

## 2020-11-05 NOTE — Progress Notes (Signed)
   Follow-Up Visit   Subjective  [name] [name] is a 38 y.o. female who presents for the following: Skin Problem (skin tag- left belly, sk-wants removed- left shoulder & left leg).  Several growths plus follow-up for facial rash Location:  Duration:  Quality:  Associated Signs/Symptoms: Modifying Factors:  Severity:  Timing: Context:   Objective  Well appearing patient in no apparent distress; mood and affect are within normal limits. Objective  Left Abdomen (side) - Upper: Fleshy, 1 to 2 mm pedunculated papules.    Objective  Left Hip, Left Thigh - Anterior, Left Upper Back, Right Thigh - Anterior: 3 to 5 mm brown noninflamed flattopped textured papules  Objective  Left Upper Cutaneous Lip: Active dermatitis around mouth much improved.    A focused examination was performed including Head, neck, torso, arms, legs.. Relevant physical exam findings are noted in the Assessment and Plan.   Assessment & Plan    Skin tag Left Abdomen (side) - Upper  Snip at his request, no procedure charge and no waiver per dr Ataya Murdy  Seborrheic keratosis (4) Left Thigh - Anterior; Right Thigh - Anterior; Left Hip; Left Upper Back  Patient requests treatment  Destruction of lesion - Left Hip, Left Thigh - Anterior, Left Upper Back, Right Thigh - Anterior Complexity: simple   Destruction method: cryotherapy   Informed consent: discussed and consent obtained   Timeout:  patient name, date of birth, surgical site, and procedure verified Lesion destroyed using liquid nitrogen: Yes   Cryotherapy cycles:  3 Outcome: patient tolerated procedure well with no complications    Perioral dermatitis Left Upper Cutaneous Lip  We will try to taper the minocycline over 1 to 2 months.  Call if requires refills.      I, Janalyn Harder, MD, have reviewed all documentation for this visit.  The documentation on 11/05/20 for the exam, diagnosis, procedures, and orders are all accurate and  complete.

## 2020-11-14 ENCOUNTER — Other Ambulatory Visit: Payer: Self-pay | Admitting: Adult Health

## 2020-12-04 ENCOUNTER — Ambulatory Visit: Payer: 59 | Admitting: Internal Medicine

## 2020-12-26 ENCOUNTER — Other Ambulatory Visit: Payer: Self-pay | Admitting: Adult Health

## 2020-12-26 MED ORDER — WEGOVY 1 MG/0.5ML ~~LOC~~ SOAJ: 1.0000 mg | SUBCUTANEOUS | 2 refills | Status: DC

## 2021-01-16 ENCOUNTER — Other Ambulatory Visit: Payer: Self-pay

## 2021-01-16 ENCOUNTER — Ambulatory Visit (INDEPENDENT_AMBULATORY_CARE_PROVIDER_SITE_OTHER): Payer: 59 | Admitting: Internal Medicine

## 2021-01-16 ENCOUNTER — Encounter: Payer: Self-pay | Admitting: Internal Medicine

## 2021-01-16 VITALS — BP 120/82 | HR 68 | Ht 61.5 in | Wt 182.2 lb

## 2021-01-16 DIAGNOSIS — E010 Iodine-deficiency related diffuse (endemic) goiter: Secondary | ICD-10-CM | POA: Diagnosis not present

## 2021-01-16 DIAGNOSIS — E063 Autoimmune thyroiditis: Secondary | ICD-10-CM

## 2021-01-16 LAB — T3, FREE: T3, Free: 2.9 pg/mL (ref 2.3–4.2)

## 2021-01-16 LAB — TSH: TSH: 1.2 u[IU]/mL (ref 0.35–4.50)

## 2021-01-16 NOTE — Progress Notes (Addendum)
Patient ID: [name] [name], female   DOB: 10-22-1981, 40 y.o.   MRN: [id number]   This visit occurred during the SARS-CoV-2 public health emergency.  Safety protocols were in place, including screening questions prior to the visit, additional usage of staff PPE, and extensive cleaning of exam room while observing appropriate contact time as indicated for disinfecting solutions.   HPI  [name] [name] is a 40 y.o.-year-old female, initially referred by her PCP, Lucky Cowboy, MD, returning for follow-up for Hashimoto's thyroiditis and goiter.  Last visit 1 year ago.  Since last OV, in 09-08/2020 >> started [facility] low dose >> lost 13 lbs (she recently increased the dose to 1 mg weekly).  She was diagnosed with Hashimoto's thyroiditis in 2017.  She did not require thyroid hormones yet.  We started selenium 200 mcg daily in 05/2019.  She did not feel different on this, but her thyroid antibodies decreased.  Reviewed her TFTs: Lab Results  Component Value Date   TSH 1.32 06/06/2020   TSH 1.31 12/07/2019   TSH 1.31 05/24/2019   TSH 1.43 02/04/2019   TSH 1.53 08/05/2018   TSH 0.87 11/05/2016   TSH 1.302 10/25/2013   TSH 1.289 09/02/2012   FREET4 0.89 06/06/2020   FREET4 0.84 12/07/2019   FREET4 0.69 05/24/2019    TPO antibodies were elevated during her pregnancy and they remained elevated after giving birth, although increasing: Component     Latest Ref Rng & Units 11/05/2016  Thyroglobulin Ab     <2 IU/mL <1  Thyroperoxidase Ab SerPl-aCnc     <9 IU/mL 139 (H)   Component     Latest Ref Rng & Units 05/24/2019  Thyroperoxidase Ab SerPl-aCnc     <9 IU/mL 42 (H)  Thyroglobulin Ab     < or = 1 IU/mL 1   Component     Latest Ref Rng & Units 12/07/2019  Thyroglobulin Ab     < or = 1 IU/mL <1  Thyroperoxidase Ab SerPl-aCnc     <9 IU/mL 36 (H)   Thyroid ultrasound (04/19/2019): Mildly heterogeneous and enlarged thyroid gland, without nodules Parenchymal Echotexture:  Mildly heterogenous Isthmus: 0.4 cm Right lobe: 4.6 x 1.7 x 1.6 cm Left lobe: 4.7 x 1.4 x 1.3 cm  At last visit, she described a long history of: - weight gain - gained 20 lbs when moved to [place] for 1 year, then moved back in 06/2018 lost 10 lbs. Lowest weight 160 lb. -Fatigue -Cold intolerance -Depression/anxiety- on Lexapro.  -Constipation- occasional -Hair loss- occasional  At this visit, she continues to have the above symptoms: fatigue and insomnia, improved, cold intolerance still present. Hair loss increased after Covid19 in 10/2020.  Pt denies: - feeling nodules in neck - hoarseness - dysphagia - choking - SOB with lying down  She has no family history of thyroid disease but FH of thyroid cancer + M aunt  - had hemithyroidectomy.  She has a family history of autoimmune disease in mother (psoriasis).   No recent steroid use. No history of radiation therapy to head or neck No iodine supplements or biotin.  Pt. also has a history of cholecystectomy, C-section with her son in 2017.  Therapist: Dr. Cyndia Skeeters.  ROS: Constitutional: no weight gain/+ weight loss (13 lbs since last OV!), no fatigue, no subjective hyperthermia, no subjective hypothermia Eyes: no blurry vision, no xerophthalmia ENT: no sore throat, + see HPI Cardiovascular: no CP/no SOB/no palpitations/no leg swelling Respiratory: no cough/no SOB/no wheezing Gastrointestinal: no N/no V/no  D/no C/+ acid reflux Musculoskeletal: no muscle aches/no joint aches Skin: no rashes, no hair loss Neurological: no tremors/no numbness/no tingling/no dizziness  I reviewed pt's medications, allergies, PMH, social hx, family hx, and changes were documented in the history of present illness. Otherwise, unchanged from my initial visit note.  Past Medical History:  Diagnosis Date  . Abnormal Pap smear 2002   Colpo and Laser HPV  . Former smoker   . Headache    migraines  . HSV-1 (herpes simplex virus 1) infection 4/11    positive on peri-anal c & s  . Hx of migraines 03/07/2016  . Obesity   . Vaginal Pap smear, abnormal    Past Surgical History:  Procedure Laterality Date  . CESAREAN SECTION N/A 06/21/2016   Procedure: CESAREAN SECTION;  Surgeon: Jaymes Graff, MD;  Location: [facility];  Service: Obstetrics;  Laterality: N/A;  . CHOLECYSTECTOMY, LAPAROSCOPIC  2004  . COLPOSCOPY  2002   HPV, laser  . TONSILLECTOMY AND ADENOIDECTOMY  2002   Social History   Socioeconomic History  . Marital status: Single    Spouse name: Not on file  . Number of children: 1  . Years of education: Not on file  . 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 Smoking status: Former Smoker    Quit date: 06/20/2003    Years since quitting: 15.9  . Smokeless tobacco: Never Used  . Tobacco comment: in college  Substance and Sexual Activity  . Alcohol use: No  . Drug use: No  . Sexual activity: Not Currently    Birth control/protection: None  Lifestyle  . Physical activity    Days per week: 2 days    Minutes per session: 30 min  . Stress: Only a little   Current Outpatient Medications on File Prior to Visit  Medication Sig Dispense Refill  . ascorbic acid (VITAMIN C) 500 MG tablet Take 500 mg by mouth daily.    . Cholecalciferol (VITAMIN D) 50 MCG (2000 UT) tablet Take 2,000 Units by mouth daily.    Marland Kitchen escitalopram (LEXAPRO) 10 MG tablet TAKE 1 TABLET(10 MG) BY MOUTH DAILY. 90 tablet 1  . meloxicam (MOBIC) 15 MG tablet Take 1/2 to 1 tablet Daily with Food for Pain & Inflammation (Patient taking differently: as needed. Take 1/2 to 1 tablet Daily with Food for Pain & Inflammation) 90 tablet 1  . minocycline (MINOCIN) 50 MG capsule Take 1 capsule (50 mg total) by mouth daily. 60 capsule 1  . montelukast (SINGULAIR) 10 MG tablet Take      1 tablet     Daily        for Allergies 90 tablet 0  . phentermine (ADIPEX-P) 37.5 MG tablet TAKE 1  TABLET BY MOUTH DAILY BEFORE BREAKFAST. REMEMBER TO TAKE FREQUENT DRUG BREAKS 30 tablet 1  . selenium 50 MCG TABS tablet Take 200 mcg by mouth daily.     . Semaglutide-Weight Management (WEGOVY) 1 MG/0.5ML SOAJ Inject 1 mg into the skin once a week. 2 mL 2  . tacrolimus (PROTOPIC) 0.1 % ointment Apply 1 application topically 2 (two) times daily.    Marland Kitchen topiramate (TOPAMAX) 25 MG tablet Take      1 tablet      at Suppertime     &     2 tablets       at Bedtime for Dieting & Weight Loss 270  tablet 1   No current facility-administered medications on file prior to visit.   Allergies  Allergen Reactions  . Other Other (See Comments)    steroid eye drops-causes glaucoma    Family History  Problem Relation Age of Onset  . Depression Mother   . Suicidality Mother   . Lung cancer Paternal Grandfather   . ALS Maternal Grandmother   . Breast cancer Maternal Grandmother 79  . Alzheimer's disease Maternal Grandfather   . Prostate cancer Maternal Grandfather   . Thyroid cancer Maternal Aunt   . ALS Maternal Uncle 54  . Leukemia Paternal Aunt 43       CLL   PE: BP 120/82   Pulse 68   Ht 5' 1.5" (1.562 m)   Wt 182 lb 3.2 oz (82.6 kg)   SpO2 98%   BMI 33.87 kg/m  Wt Readings from Last 3 Encounters:  01/16/21 182 lb 3.2 oz (82.6 kg)  08/23/20 195 lb (88.5 kg)  05/23/20 197 lb 3.2 oz (89.4 kg)   Constitutional: overweight, in NAD Eyes: PERRLA, EOMI, no exophthalmos ENT: moist mucous membranes, no thyromegaly, no cervical lymphadenopathy Cardiovascular: RRR, No MRG Respiratory: CTA B Gastrointestinal: abdomen soft, NT, ND, BS+ Musculoskeletal: no deformities, strength intact in all 4 Skin: moist, warm, no rashes Neurological: no tremor with outstretched hands, DTR normal in all 4  ASSESSMENT: 1. Hashimoto thyroiditis  2. Thyromegaly  PLAN: 1. Hashimoto thyroiditis -Patient has a history of elevated thyroid antibodies and also, a heterogeneous aspect of her thyroid on ultrasound.   She does not have a personal history of radiation therapy to head or neck.  She does have an aunt with thyroid cancer (unclear type. -We again discussed that Hashimoto's thyroiditis is an autoimmune disorder with a fluctuating course, in which antibodies wax and wane.  The high antibodies will cause inflammation of the thyroid gland and the meantime, destruction of the gland with subsequent hypothyroidism.  However, so far, her TFTs have been normal, so she did not require levothyroxine treatment.  We do need to follow the TFTs trends, though. -We discussed in the past about treatment for Hashimoto's thyroiditis to include improving her immune system (stress reduction; relaxation; diet -discussed reduction of dairy, gluten, meat and increasing fruits and vegetables; exercise; sleep) and also selenium.  We started selenium in 05/2019 and she continues on this.  At last visit, TPO antibodies continued to improve. -We will recheck her TFTs today along with her thyroid antibodies -So far, she did not require treatment with levothyroxine, but we discussed that if we need to start this, it needs to be taken every day, with water, at least 30 minutes before breakfast, separated by at least 4 hours from: - acid reflux medications - calcium - iron - multivitamins - RTC in 1 year  2.  Thyromegaly -On the most recent thyroid ultrasound report, thyroid gland appeared slightly enlarged, but without nodules.  On physical exam, I did not feel a goiter -No neck compression symptoms, but she occasionally has dysphagia and sore throat (she has postnasal drip, reflux) -We discussed that thyroid enlargement is most likely related to Hashimoto's thyroiditis -No further investigation is needed at this point  Component     Latest Ref Rng & Units 01/16/2021  TSH     0.35 - 4.50 uIU/mL 1.20  Triiodothyronine,Free,Serum     2.3 - 4.2 pg/mL 2.9  T4,Free(Direct)     0.60 - 1.60 ng/dL 7.03  TFTs are normal.  Component  Latest Ref Rng & Units 01/16/2021  Thyroperoxidase Ab SerPl-aCnc     <9 IU/mL 24 (H)  TPO antibodies are slightly high, but they continue to improve.  Carlus Pavlovristina Io Dieujuste, MD PhD [facility]

## 2021-01-16 NOTE — Patient Instructions (Addendum)
Please stop at the lab.  Continue selenium 200 mcg daily.  Please return for labs in 6 months but for another visit in 1 year.

## 2021-01-17 LAB — THYROID PEROXIDASE ANTIBODY: Thyroperoxidase Ab SerPl-aCnc: 24 IU/mL — ABNORMAL HIGH (ref <9)

## 2021-01-17 LAB — T4, FREE: Free T4: 0.89 ng/dL (ref 0.60–1.60)

## 2021-01-26 ENCOUNTER — Other Ambulatory Visit: Payer: Self-pay | Admitting: Adult Health

## 2021-02-18 IMAGING — US US THYROID
1 series · 14 of 25 positions shown · non-contrast
Comparison: None.

CLINICAL DATA: Palpable abnormality. 38-year-old female with
thyromegaly and a family history of thyroid cancer.

EXAM:
THYROID ULTRASOUND
TECHNIQUE: Ultrasound examination of the thyroid gland and adjacent soft
tissues was performed.

[Series 1: us thyroid · 0.04mm/px · 14 of 37 slices shown]
[im 1/37]
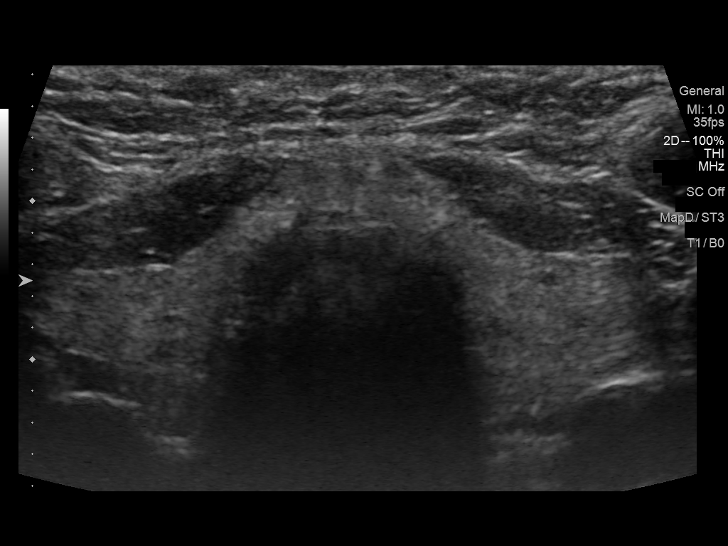
[im 4/37]
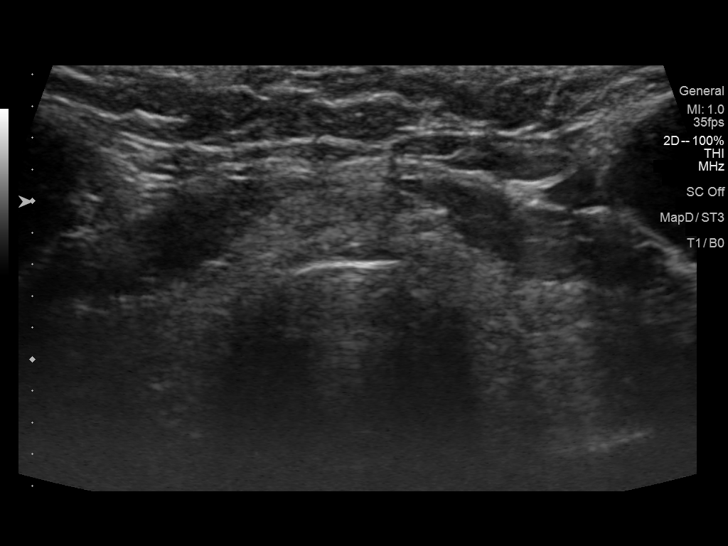
[im 7/37]
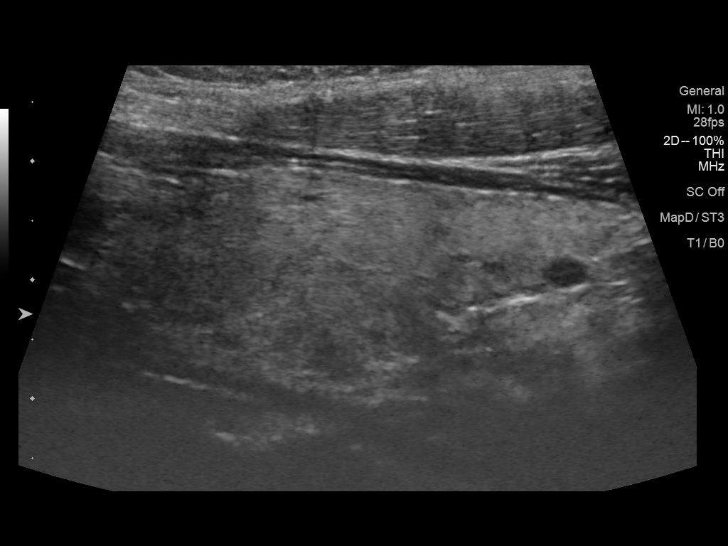
[im 10/37]
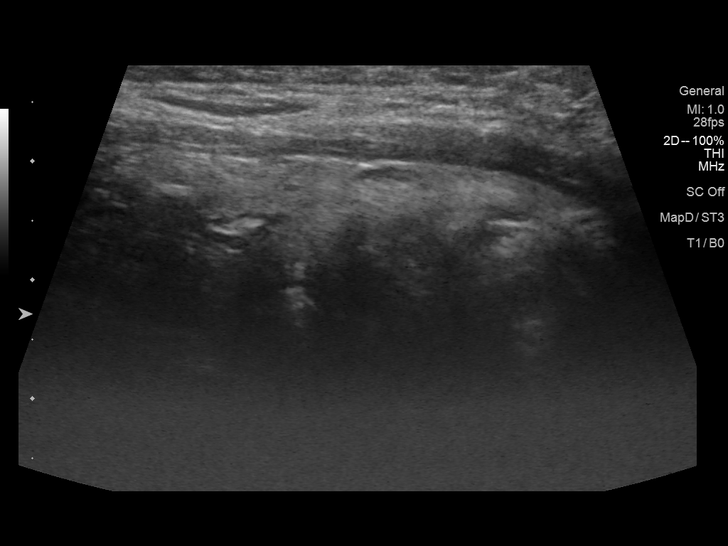
[im 13/37]
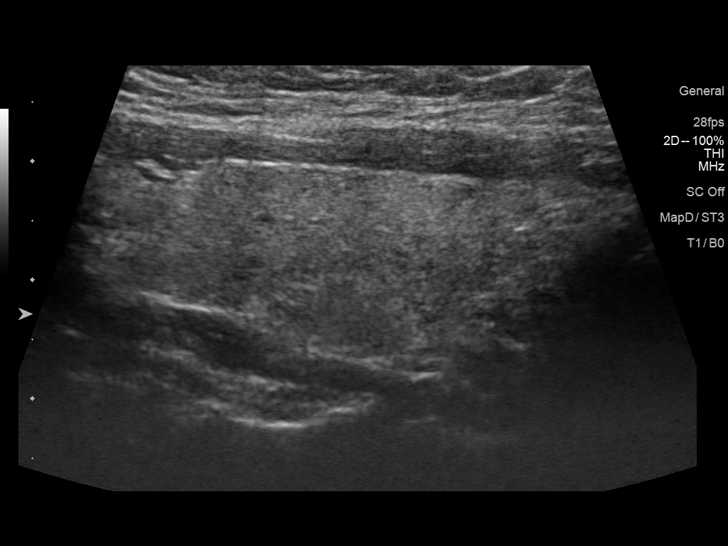
[im 14/37]
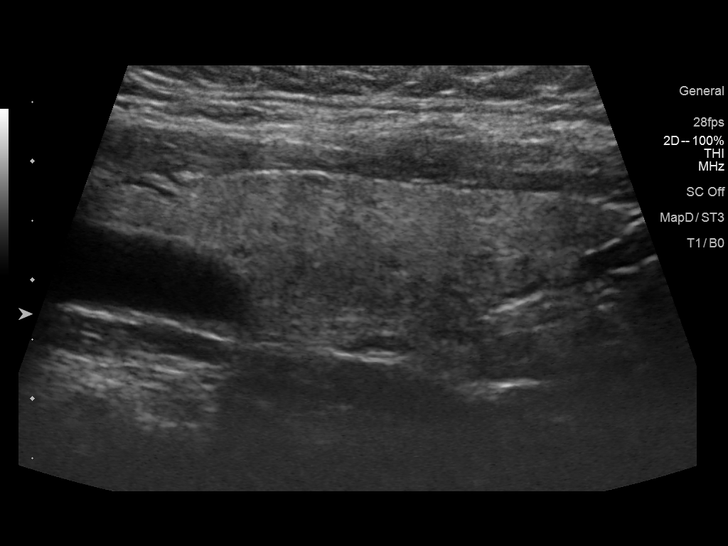
[im 17/37]
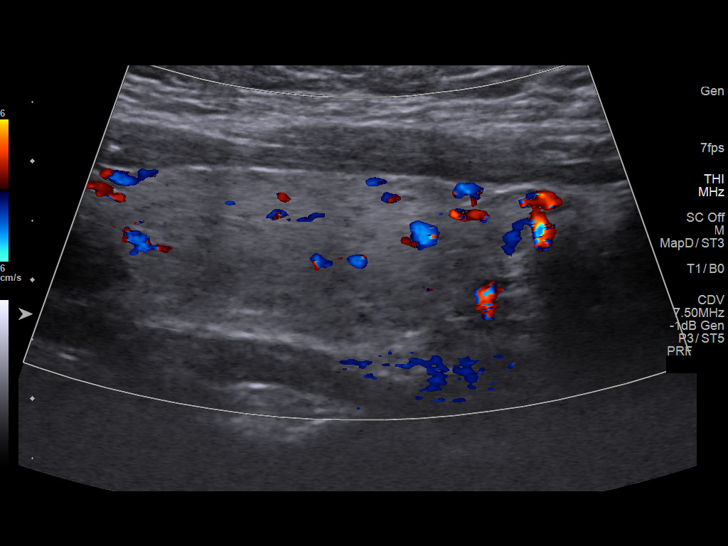
[im 20/37]
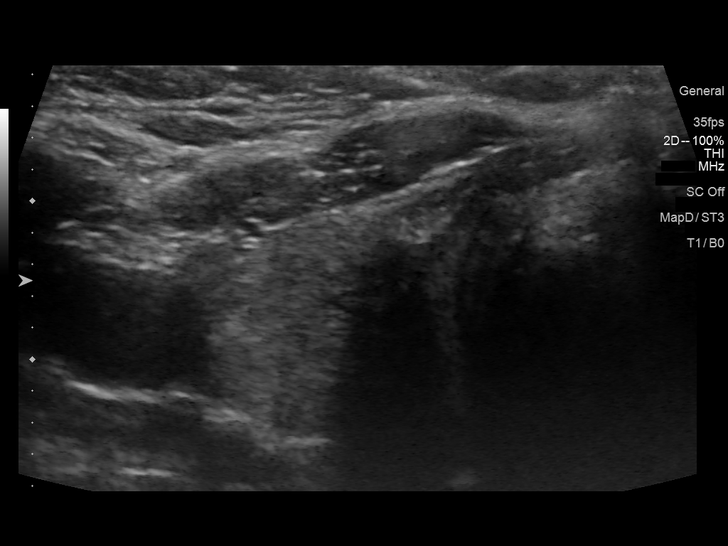
[im 23/37]
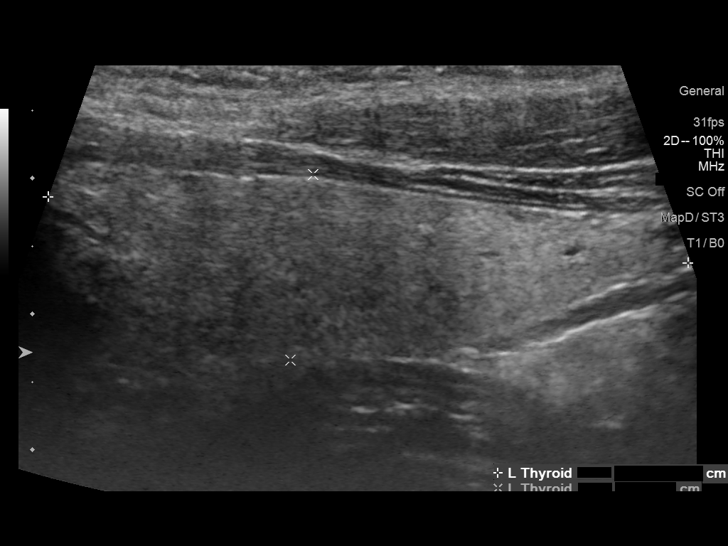
[im 25/37]
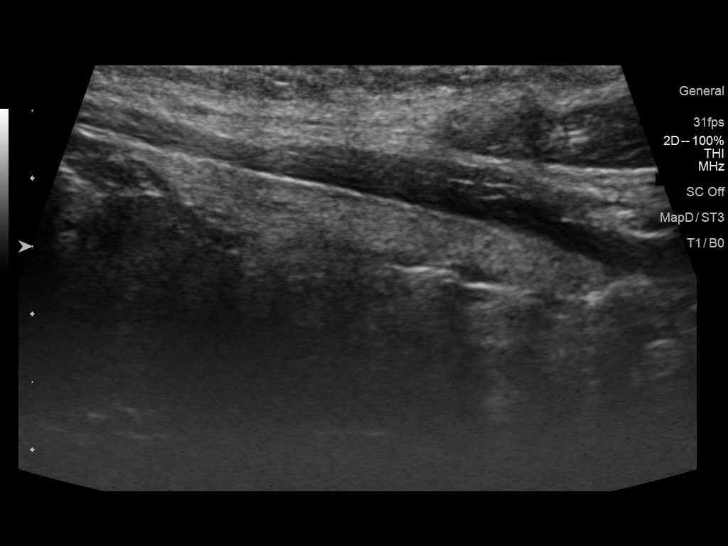
[im 28/37]
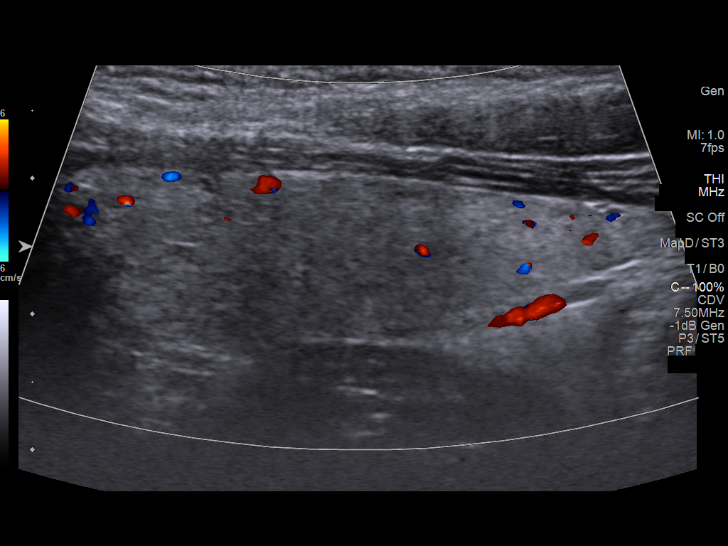
[im 31/37]
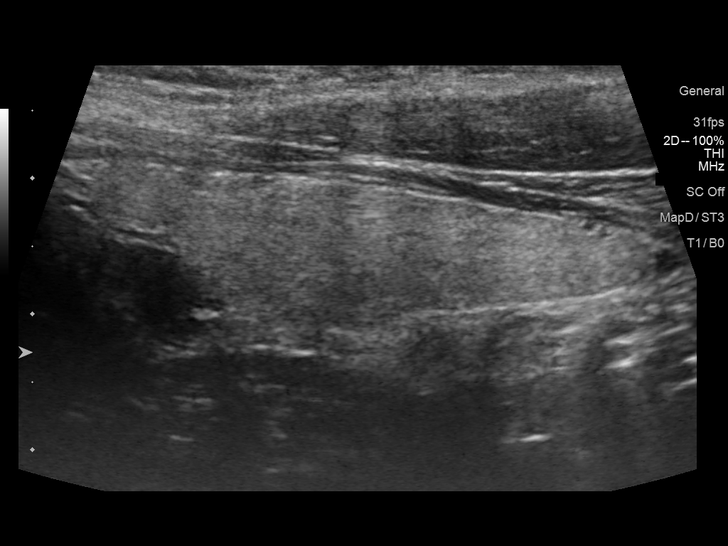
[im 34/37]
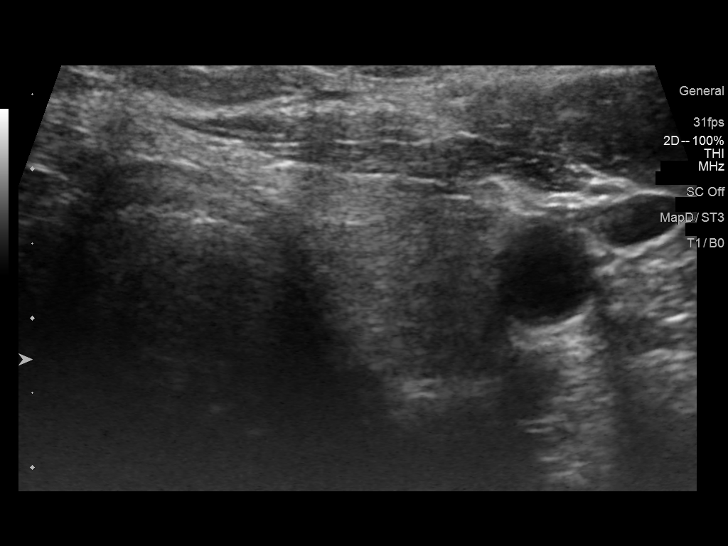
[im 37/37]
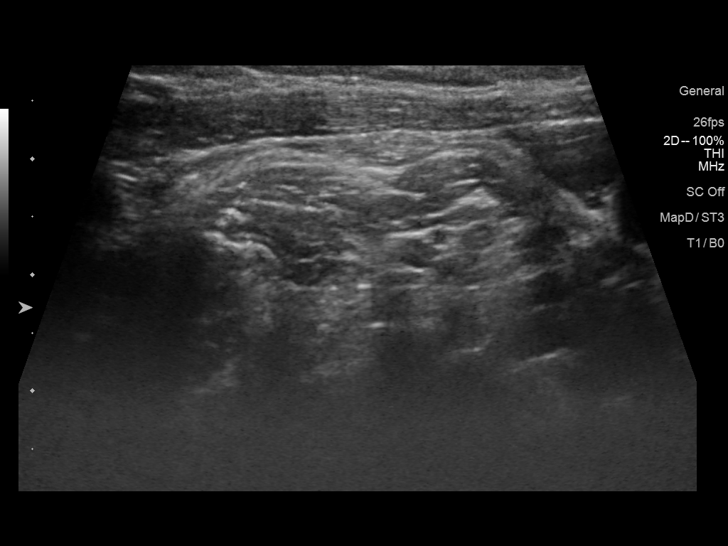

[14 of 25 positions shown; findings below may reference images not displayed]

FINDINGS: Parenchymal Echotexture: Mildly heterogenous

Isthmus: 0.4 cm

Right lobe: 4.6 x 1.7 x 1.6 cm

Left lobe: 4.7 x 1.4 x 1.3 cm

_________________________________________________________

Estimated total number of nodules >/= 1 cm: 0

Number of spongiform nodules >/=  2 cm not described below (TR1): 0

Number of mixed cystic and solid nodules >/= 1.5 cm not described
below (TR2): 0

_________________________________________________________

No discrete nodules are seen within the thyroid gland.
IMPRESSION: Mildly heterogeneous and enlarged thyroid gland without discrete
thyroid nodule.

## 2021-02-26 NOTE — Progress Notes (Signed)
6 MONTH FOLLOW UP  Assessment and Plan:   Obesity - BMI 33 Long discussion about weight loss, diet, and exercise Excellent progress with wegovy and counseling Recommended diet heavy in fruits and veggies and low in animal meats, cheeses, and dairy products, appropriate calorie intake Discussed appropriate weight for height and initial goal (160lb) She is on topamax, wegovy 1 mg/week with excellent results Continue to monitor for goal of 1lb/week  Follow up 6 months unless hitting plateau or concerns  Hashimoto's thyroiditis Monitor closely; following with Dr. Elvera Lennox as well per patient preference will be seeing annually Thyroid US showed heterogenous enlargement Defer TSH to endocrine, q42m  Situational stress Continue medications: lexapro 10 mg Followed by Dr. Cyndia Skeeters Stress management techniques discussed, increase water, good sleep hygiene discussed, increase exercise, and increase veggies.   History of HSV-1 infection Antivirals PRN; use barrier with new partners  Vitamin D deficiency Continue daily supplement;   Discussed med's effects and SE's.  Over 20 minutes of exam, counseling, chart review, and moderate level critical decision making was performed this visit.   Future Appointments  Date Time Provider Department Center  07/31/2021 10:00 AM [facility] None  08/29/2021  3:00 PM Judd Gaudier, NP [facility] None  01/22/2022 10:00 AM Carlus Pavlov, MD [facility] None     HPI  40 y.o. female  presents for 6 month follow up on obesity, vit D def, Hashimoto's thyroiditis.   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   she has a diagnosis of situational depression/anxiety, on lexapro 10 mg daily and doing well with this, helps with stress and racing thoughts. Uses melatonin occasionally. she is seeing a therapist Dr. Cyndia Skeeters q4-6 weeks as needed.     BMI is Body mass index is 33.09 kg/m., she is  working on diet and exercise, has lost some weight since returning from [place]. Peak weight 232 lb in 06/2016, 207 lb in 01/2020 recently. She had limited benefit on phentermine/topamax  difficult to get off high dose topamax in past, also takes for headache/migraine.  Recently started on wegovy, up to 1 mg/week, down 4 lb since last check.  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  Doing beach body videos 3 days a week, walking on treadmill No alcohol, 8 glasses of water daily Reduced diet soda - rare, maybe 1/week  Would like to get down to around 160 lb Wt Readings from Last 3 Encounters:  02/27/21 178 lb (80.7 kg)  01/16/21 182 lb 3.2 oz (82.6 kg)  08/23/20 195 lb (88.5 kg)   Today their BP is BP: 112/70 She does workout, trying to be more active and walking with son. She denies chest pain, shortness of breath, dizziness.   The cholesterol last visit was:   Lab Results  Component Value Date   CHOL 162 08/23/2020   HDL 59 08/23/2020   LDLCALC 83 08/23/2020   TRIG 104 08/23/2020   CHOLHDL 2.7 08/23/2020   Last I7T in the office was:  Lab Results  Component Value Date   HGBA1C 5.2 08/23/2020   Last GFR: Lab Results  Component Value Date   GFRNONAA 92 08/23/2020   She has hx of + thyroid antibody/Hashimoto's, had [place] 2020 without nodules, on selenium 200 mcg follows with Dr. Elvera Lennox:  Lab Results  Component Value Date   TSH 1.20 01/16/2021   Patient is on Vitamin D supplement, taking 2000 IU daily    Lab Results  Component Value Date   VD25OH 53 08/23/2020     Discussed getting on B12 supplement Lab Results  Component Value Date   VITAMINB12 386 11/05/2016     Current Medications:  Current Outpatient Medications on File Prior to Visit  Medication Sig Dispense Refill   . ascorbic acid (VITAMIN C) 500 MG tablet Take 500 mg by mouth daily.    . Cholecalciferol (VITAMIN D) 50 MCG (2000 UT) tablet Take 2,000 Units by mouth daily.    Marland Kitchen escitalopram (LEXAPRO) 10 MG tablet TAKE 1 TABLET(10 MG) BY MOUTH DAILY. 90 tablet 1  . meloxicam (MOBIC) 15 MG tablet Take 1/2 to 1 tablet Daily with Food for Pain & Inflammation (Patient taking differently: as needed. Take 1/2 to 1 tablet Daily with Food for Pain & Inflammation) 90 tablet 1  . minocycline (MINOCIN) 50 MG capsule Take 1 capsule (50 mg total) by mouth daily. 60 capsule 1  . montelukast (SINGULAIR) 10 MG tablet TAKE 1 TABLET BY MOUTH DAILY FOR ALLERGIES 90 tablet 3  . selenium 50 MCG TABS tablet Take 200 mcg by mouth daily.     . Semaglutide-Weight Management (WEGOVY) 1 MG/0.5ML SOAJ Inject 1 mg into the skin once a week. 2 mL 2  . topiramate (TOPAMAX) 25 MG tablet Take      1 tablet      at Suppertime     &     2 tablets       at Bedtime for Dieting & Weight Loss 270 tablet 1  . phentermine (ADIPEX-P) 37.5 MG tablet TAKE 1 TABLET BY MOUTH DAILY BEFORE BREAKFAST. REMEMBER TO TAKE FREQUENT DRUG BREAKS (Patient not taking: Reported on 02/27/2021) 30 tablet 1  . tacrolimus (PROTOPIC) 0.1 % ointment Apply 1 application topically 2 (two) times daily. (Patient not taking: Reported on 02/27/2021)     No current facility-administered medications on file prior to visit.   Allergies:  Allergies  Allergen Reactions  . Other Other (See Comments)    steroid eye drops-causes glaucoma    Medical History:  She has Situational stress; Hashimoto's thyroiditis; History of HSV-1 infection; Vitamin D deficiency; Obesity (BMI 35.0-39.9 without comorbidity); and Thyromegaly on their problem list.  Surgical History:  She has a past surgical history that includes Cholecystectomy, laparoscopic (2004); Tonsillectomy and adenoidectomy (2002); Colposcopy (2002); and Cesarean section (N/A, 06/21/2016). Family History:  Herfamily history  includes ALS in her maternal grandmother; ALS (age of onset: 48) in her maternal uncle; Alzheimer's disease in her maternal grandfather; Breast cancer (age of onset: 41) in her maternal grandmother; Depression in her mother; Leukemia (age of onset: 64) in her paternal aunt; Lung cancer in her paternal grandfather; Prostate cancer in her maternal grandfather; Suicidality in her mother; Thyroid cancer in her maternal aunt. Social History:  She reports that she quit smoking about 17 years ago. Her smoking use included cigarettes. She has a 0.60 pack-year smoking history. She has never used smokeless tobacco. She reports that she does not drink alcohol and does not use drugs.   Review of Systems: Review of Systems  Constitutional: Negative for malaise/fatigue and weight loss.  HENT: Negative for hearing loss and tinnitus.   Eyes: Negative for blurred vision and double vision.  Respiratory: Negative for cough, shortness of breath and wheezing.   Cardiovascular: Negative for chest pain, palpitations,  orthopnea, claudication and leg swelling.  Gastrointestinal: Negative for abdominal pain, blood in stool, constipation, diarrhea, heartburn, melena, nausea and vomiting.  Genitourinary: Negative.   Musculoskeletal: Negative for joint pain and myalgias.  Skin: Negative for rash.  Neurological: Negative for dizziness, tingling, sensory change, weakness and headaches.  Endo/Heme/Allergies: Negative for polydipsia.  Psychiatric/Behavioral: Negative for depression, substance abuse and suicidal ideas. The patient is not nervous/anxious and does not have insomnia.   All other systems reviewed and are negative.   Physical Exam: Estimated body mass index is 33.09 kg/m as calculated from the following:   Height as of 01/16/21: 5' 1.5" (1.562 m).   Weight as of this encounter: 178 lb (80.7 kg). BP 112/70   Pulse 69   Temp 97.7 F (36.5 C)   Wt 178 lb (80.7 kg)   SpO2 99%   BMI 33.09 kg/m    General  Appearance: Well nourished, in no apparent distress.  Eyes: PERRLA, EOMs, conjunctiva no swelling or erythema Sinuses: No Frontal/maxillary tenderness  ENT/Mouth: Ext aud canals clear, normal light reflex with TMs without erythema, bulging. Good dentition. No erythema, swelling, or exudate on post pharynx. Tonsils not swollen or erythematous. Hearing normal.  Neck: Supple, thyroid mildly enlarged without palpable nodules. No bruits  Respiratory: Respiratory effort normal, BS equal bilaterally without rales, rhonchi, wheezing or stridor.  Cardio: RRR without murmurs, rubs or gallops. Brisk peripheral pulses without edema.  Chest: symmetric, with normal excursions and percussion.  Abdomen: Soft, nontender, no guarding, rebound, hernias, masses, or organomegaly.  Lymphatics: Non tender without lymphadenopathy.  Musculoskeletal: Full ROM all peripheral extremities,5/5 strength, and normal gait.  Skin: Warm, dry without rashes, lesions, ecchymosis. Neuro: Cranial nerves intact, reflexes equal bilaterally. Normal muscle tone, no cerebellar symptoms. Sensation intact.  Psych: Awake and oriented X 3, normal affect, Insight and Judgment appropriate.     [name] [name] 11:17 AM [facility]

## 2021-02-27 ENCOUNTER — Ambulatory Visit (INDEPENDENT_AMBULATORY_CARE_PROVIDER_SITE_OTHER): Payer: 59 | Admitting: Adult Health

## 2021-02-27 ENCOUNTER — Encounter: Payer: Self-pay | Admitting: Adult Health

## 2021-02-27 ENCOUNTER — Other Ambulatory Visit: Payer: Self-pay

## 2021-02-27 VITALS — BP 112/70 | HR 69 | Temp 97.7°F | Wt 178.0 lb

## 2021-02-27 DIAGNOSIS — E669 Obesity, unspecified: Secondary | ICD-10-CM

## 2021-02-27 DIAGNOSIS — E063 Autoimmune thyroiditis: Secondary | ICD-10-CM

## 2021-02-27 DIAGNOSIS — F439 Reaction to severe stress, unspecified: Secondary | ICD-10-CM

## 2021-02-27 DIAGNOSIS — E559 Vitamin D deficiency, unspecified: Secondary | ICD-10-CM | POA: Diagnosis not present

## 2021-02-27 DIAGNOSIS — Z1283 Encounter for screening for malignant neoplasm of skin: Secondary | ICD-10-CM

## 2021-04-10 ENCOUNTER — Other Ambulatory Visit: Payer: Self-pay | Admitting: Internal Medicine

## 2021-04-10 ENCOUNTER — Other Ambulatory Visit: Payer: Self-pay | Admitting: Adult Health

## 2021-04-10 MED ORDER — MELOXICAM 15 MG PO TABS: ORAL_TABLET | ORAL | 1 refills | Status: DC

## 2021-05-03 ENCOUNTER — Other Ambulatory Visit: Payer: Self-pay | Admitting: Adult Health

## 2021-05-03 MED ORDER — WEGOVY 1.7 MG/0.75ML ~~LOC~~ SOAJ: 1.7000 mg | SUBCUTANEOUS | 2 refills | Status: DC

## 2021-05-29 ENCOUNTER — Other Ambulatory Visit: Payer: Self-pay | Admitting: Adult Health

## 2021-05-29 DIAGNOSIS — F439 Reaction to severe stress, unspecified: Secondary | ICD-10-CM

## 2021-07-13 ENCOUNTER — Ambulatory Visit: Payer: 59 | Admitting: Adult Health

## 2021-07-13 ENCOUNTER — Encounter: Payer: Self-pay | Admitting: Adult Health

## 2021-07-13 ENCOUNTER — Other Ambulatory Visit: Payer: Self-pay

## 2021-07-13 VITALS — HR 75 | Wt 160.0 lb

## 2021-07-13 DIAGNOSIS — U071 COVID-19: Secondary | ICD-10-CM | POA: Diagnosis not present

## 2021-07-13 MED ORDER — PROMETHAZINE-DM 6.25-15 MG/5ML PO SYRP: 5.0000 mL | ORAL_SOLUTION | Freq: Four times a day (QID) | ORAL | 1 refills | Status: DC | PRN

## 2021-07-13 MED ORDER — DEXAMETHASONE 1 MG PO TABS: ORAL_TABLET | ORAL | 0 refills | Status: DC

## 2021-07-13 NOTE — Progress Notes (Signed)
THIS ENCOUNTER IS A VIRTUAL VISIT DUE TO COVID-19 - PATIENT WAS NOT SEEN IN THE OFFICE.  PATIENT HAS CONSENTED TO VIRTUAL VISIT / TELEMEDICINE VISIT   Virtual Visit via telephone Note  I connected with  [name] [name] on 07/13/2021 by telephone.  I verified that I am speaking with the correct person using two identifiers.    I discussed the limitations of evaluation and management by telemedicine and the availability of in person appointments. The patient expressed understanding and agreed to proceed.  History of Present Illness:  Pulse 75   Wt 160 lb (72.6 kg)   SpO2 98%   BMI 29.74 kg/m  40 y.o. patient contacted office reporting URI sx and positive home covid 19 test. she tested positive by rapid test yesterday. OV was conducted by telephone to minimize exposure. This patient was vaccinated for covid 19, last 2/2 12/2019, moderna.   Sx began 3 days days ago with sore throat, HA, dry cough. She reports since last night increased mucinex, coughing so hard threw up this AM. Mild chills yesterday. Also some body aches. Denies CP, dyspnea, wheezing.   Treatments tried so far: Alternating tylenol and ibuprofen.   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   Immunization History  Administered Date(s) Administered   HPV Quadrivalent 01/10/2006, 03/11/2006, 07/10/2006   Influenza-Unspecified 08/24/2018   Moderna Sars-Covid-2 Vaccination 12/06/2019, 01/07/2020   Tdap 06/19/2015     Medications  Current Outpatient Medications (Endocrine & Metabolic):    dexamethasone (DECADRON) 1 MG tablet, Take 3 tabs for 3 days, 2 tabs for 3 days 1 tab for 5 days. Take with food.   Current Outpatient Medications (Respiratory):    montelukast (SINGULAIR) 10 MG tablet, TAKE 1 TABLET BY MOUTH DAILY FOR ALLERGIES   promethazine-dextromethorphan (PROMETHAZINE-DM) 6.25-15 MG/5ML syrup, Take 5 mLs by mouth 4 (four) times daily as needed for cough.  Current Outpatient  Medications (Analgesics):    meloxicam (MOBIC) 15 MG tablet, Take 1/2 to 1 tablet Daily with Food for Pain & Inflammation (Patient taking differently: as needed. Take 1/2 to 1 tablet Daily with Food for Pain & Inflammation)   Current Outpatient Medications (Other):    ascorbic acid (VITAMIN C) 500 MG tablet, Take 500 mg by mouth daily.   Cholecalciferol (VITAMIN D) 50 MCG (2000 UT) tablet, Take 2,000 Units by mouth daily.   escitalopram (LEXAPRO) 10 MG tablet, TAKE 1 TABLET(10 MG) BY MOUTH DAILY   selenium 50 MCG TABS tablet, Take 200 mcg by mouth daily.    Semaglutide-Weight Management (WEGOVY) 1.7 MG/0.75ML SOAJ, Inject 1.7 mg into the skin once a week.   topiramate (TOPAMAX) 25 MG tablet, Take      1 tablet      at Suppertime     &     2 tablets       at Bedtime for Dieting & Weight Loss   minocycline (MINOCIN) 50 MG capsule, Take 1 capsule (50 mg total) by mouth daily. (Patient not taking: Reported on 07/13/2021)  Allergies:  Allergies  Allergen Reactions   Other Other (See Comments)    steroid eye drops-causes glaucoma     Problem list She has Situational stress; Hashimoto's thyroiditis; History of HSV-1 infection; Vitamin D deficiency; Obesity (BMI 35.0-39.9 without comorbidity); Thyromegaly; and COVID-19 (07/12/2021) on their problem list.   Social History:   reports that she quit smoking about 18 years ago. Her smoking use included cigarettes. She has a 0.60 pack-year smoking history. She has never  used smokeless tobacco. She reports that she does not drink alcohol and does not use drugs.  Observations/Objective:  General : Well sounding patient in no apparent distress HEENT: no hoarseness, no cough for duration of visit Lungs: speaks in complete sentences, no audible wheezing, no apparent distress Neurological: alert, oriented x 3 Psychiatric: pleasant, judgement appropriate   Assessment and Plan:  Covid 19 Covid 19 positive per rapid screening test at home Risk factors  include: none Symptoms are: mild/mod Immue support with vitamin D 1000 mg daily, zinc 50 mg daily, vitamin D Reviewed doing regular breathing exercises, proning Take tylenol PRN temp 101+ Push hydration Regular ambulation or calf exercises exercises for clot prevention and 81 mg ASA unless contraindicated Sx supportive therapy suggested Follow up via mychart or telephone if needed Advised patient obtain O2 monitor; present to ED if persistently <88% or with severe dyspnea, CP, fever uncontrolled by tylenol, confusion, sudden decline Should remain in isolation until at least 5 days from onset of sx, 24-48 hours fever free without tylenol, sx such as cough are improved. If nausea/emesis despite below treatment can message back and will send in zofran ODT -     dexamethasone (DECADRON) 1 MG tablet; Take 3 tabs for 3 days, 2 tabs for 3 days 1 tab for 5 days. Take with food. -     promethazine-dextromethorphan (PROMETHAZINE-DM) 6.25-15 MG/5ML syrup; Take 5 mLs by mouth 4 (four) times daily as needed for cough.   Follow Up Instructions:  I discussed the assessment and treatment plan with the patient. The patient was provided an opportunity to ask questions and all were answered. The patient agreed with the plan and demonstrated an understanding of the instructions.   The patient was advised to call back or seek an in-person evaluation if the symptoms worsen or if the condition fails to improve as anticipated.  I provided 20 minutes of non-face-to-face time during this encounter.   Dan Maker, NP

## 2021-07-24 ENCOUNTER — Other Ambulatory Visit: Payer: Self-pay | Admitting: Adult Health

## 2021-07-31 ENCOUNTER — Other Ambulatory Visit: Payer: Self-pay

## 2021-07-31 ENCOUNTER — Other Ambulatory Visit: Payer: Self-pay | Admitting: Internal Medicine

## 2021-07-31 ENCOUNTER — Other Ambulatory Visit (INDEPENDENT_AMBULATORY_CARE_PROVIDER_SITE_OTHER): Payer: 59

## 2021-07-31 DIAGNOSIS — E063 Autoimmune thyroiditis: Secondary | ICD-10-CM

## 2021-07-31 LAB — TSH: TSH: 1.03 u[IU]/mL (ref 0.35–5.50)

## 2021-07-31 LAB — T4, FREE: Free T4: 0.97 ng/dL (ref 0.60–1.60)

## 2021-07-31 LAB — T3, FREE: T3, Free: 2.9 pg/mL (ref 2.3–4.2)

## 2021-08-14 ENCOUNTER — Other Ambulatory Visit: Payer: Self-pay | Admitting: Adult Health

## 2021-08-29 ENCOUNTER — Encounter: Payer: 59 | Admitting: Adult Health

## 2021-09-05 ENCOUNTER — Telehealth: Payer: Self-pay

## 2021-09-05 NOTE — Telephone Encounter (Signed)
Wegovy prior auth approved through 03/06/22 for the 1.7mg 

## 2021-10-17 ENCOUNTER — Encounter: Payer: 59 | Admitting: Adult Health

## 2021-10-19 NOTE — Progress Notes (Signed)
Complete Physical  Assessment and Plan:  Diagnoses and all orders for this visit:  Encounter for Annual Physical Exam with abnormal findings Due annually  Health Maintenance reviewed Healthy lifestyle reviewed and goals set  Overweight - BMI 29 Excellent progress! Weight down from 207 lb -  Long discussion about weight loss, diet, and exercise Recommended diet heavy in fruits and veggies and low in animal meats, cheeses, and dairy products, appropriate calorie intake Discussed appropriate weight for height and next goal (150 lb) She is on topamax Wegovy 1.7 mg weekly with perceived benefit; some SE; reduce dose back down once available again High fiber diet, weight bearing resistance exercises encouraged  Hashimoto's thyroiditis Monitor closely; following with Dr. Elvera Lennox as well per patient preference will be seeing annually Thyroid US showed heterogenous enlargement Defer TSH to endocrine, q65m   Situational stress Continue medications: lexapro 10 mg Followed by Dr. Cyndia Skeeters Stress management techniques discussed, increase water, good sleep hygiene discussed, increase exercise, and increase veggies.   History of HSV-1 infection Antivirals PRN; use barrier with new partners  Vitamin D deficiency On supplement Check level  Screening for diabetes mellitus -     Hemoglobin A1c - defer  Screening cholesterol level -     Lipid panel - defer  Screening for hematuria or proteinuria -     Urinalysis w microscopic  Medication management -     CBC with Differential/Platelet -     COMPLETE METABOLIC PANEL WITH GFR  Orders Placed This Encounter  Procedures   CBC with Differential/Platelet   COMPLETE METABOLIC PANEL WITH GFR   Magnesium   Urinalysis, Routine w reflex microscopic   VITAMIN D 25 Hydroxy (Vit-D Deficiency, Fractures)     Discussed med's effects and SE's. Screening labs and tests as requested with regular follow-up as recommended. Over 40 minutes of exam,  counseling, chart review, and complex, high level critical decision making was performed this visit.   Future Appointments  Date Time Provider Department Center  01/22/2022 10:00 AM Carlus Pavlov, MD [id number] None  10/24/2022  9:00 AM Revonda Humphrey, NP [facility] None     HPI  40 y.o. female  presents for a complete physical and follow up for has Situational stress; Hashimoto's thyroiditis; History of HSV-1 infection; Vitamin D deficiency; Obesity (BMI 35.0-39.9 without comorbidity); and Thyromegaly on their problem list.   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 Not current sexually active, declines STD testing.   Follows with GYN Dr. Normand Sloop for PAPs (hx of abnormal) and mammo. Last in 03/22/2021 was normal.   she has a diagnosis of situational depression/anxiety, on lexapro 10 mg daily and doing well with this, helps with stress and racing thoughts. Uses melatonin occasionally. she is seeing a therapist Dr. Cyndia Skeeters regularly.     BMI is Body mass index is 29.85 kg/m., she is working on diet and exercise, has lost some weight since returning from [place]. Peak weight 207 lb in 01/2020.  No longer on phentermine, now on wegovy 1.7 mg/week with continued weight loss, does have nausea on day 3, next dose down not currently available  topamax 75 mg for weight loss, also helps with headache difficult to get off high dose topamax in past.  Eating better, making better choices since wegovy, doing better feeling satisfied Trying to be more active she can do at home, some beach body videos No alcohol, 8 glasses of water daily Reduced diet soda - 2-3  cans/week Wt Readings from Last 3 Encounters:  10/23/21 163 lb 3.2 oz (74 kg)  07/13/21 160 lb (72.6 kg)  02/27/21 178 lb (80.7 kg)   Today their BP is BP: 110/78 She does workout, trying to be more active and walking with son. She denies chest pain, shortness of breath, dizziness.   The cholesterol  last visit was:   Lab Results  Component Value Date   CHOL 162 08/23/2020   HDL 59 08/23/2020   LDLCALC 83 08/23/2020   TRIG 104 08/23/2020   CHOLHDL 2.7 08/23/2020   Last A1C in the office was:  Lab Results  Component Value Date   HGBA1C 5.2 08/23/2020   Last GFR: Lab Results  Component Value Date   GFRNONAA 92 08/23/2020   She has hx of + thyroid antibody/Hashimoto's, had [place] 2020 without nodules, follows with Dr. Cruzita Lederer:  Lab Results  Component Value Date   TSH 1.03 07/31/2021   Patient is on Vitamin D supplement, taking 2000 IU daily    Lab Results  Component Value Date   VD25OH 53 08/23/2020       Current Medications:  Current Outpatient Medications on File Prior to Visit  Medication Sig Dispense Refill   ascorbic acid (VITAMIN C) 500 MG tablet Take 500 mg by mouth daily.     Cholecalciferol (VITAMIN D) 50 MCG (2000 UT) tablet Take 2,000 Units by mouth daily.     escitalopram (LEXAPRO) 10 MG tablet TAKE 1 TABLET(10 MG) BY MOUTH DAILY 90 tablet 1   meloxicam (MOBIC) 15 MG tablet Take 1/2 to 1 tablet Daily with Food for Pain & Inflammation (Patient taking differently: as needed. Take 1/2 to 1 tablet Daily with Food for Pain & Inflammation) 90 tablet 1   montelukast (SINGULAIR) 10 MG tablet TAKE 1 TABLET BY MOUTH DAILY FOR ALLERGIES 90 tablet 3   selenium 50 MCG TABS tablet Take 200 mcg by mouth daily.      topiramate (TOPAMAX) 25 MG tablet TAKE 1 TABLET BY MOUTH AT DINNER AND 2 TABLETS AT BEDTIME FOR WEIGHT 270 tablet 1   WEGOVY 1.7 MG/0.75ML SOAJ INJECT 1.7MG  INTO THE SKIN ONCE A WEEK 3 mL 2   No current facility-administered medications on file prior to visit.   Allergies:  Allergies  Allergen Reactions   Other Other (See Comments)    steroid eye drops-causes glaucoma    Medical History:  She has Situational stress; Hashimoto's thyroiditis; History of HSV-1 infection; Vitamin D deficiency; Obesity (BMI 35.0-39.9 without comorbidity); and Thyromegaly on their  problem list. Health Maintenance:   Immunization History  Administered Date(s) Administered   HPV Quadrivalent 01/10/2006, 03/11/2006, 07/10/2006   Influenza-Unspecified 08/24/2018, 08/18/2021   Moderna Sars-Covid-2 Vaccination 12/06/2019, 01/07/2020   Tdap 06/19/2015    Tetanus: 2016 Flu vaccine: will get at work, had 08/2021 HPV: 3/3, 2007 Covid 19: 2/2, had booster 02/2021, will send date  LMP: No LMP recorded. Pap: 03/22/2021 at GYN, HPV neg, Dr. Charlesetta Garibaldi, hx of abnormal in the past, s/p laser/colposcopy, getting q3y MGM: 03/22/2021, GYN DEXA: -  Thyroid US: 04/22/2019 - Mildly heterogeneous and enlarged thyroid gland without discrete thyroid nodule.  Colonoscopy: age 42 EGD: -  Last Dental Exam: Dr. Loletha Carrow in [place], 10/2020 Last Eye Exam: wears glasses, Dr. Darliss Ridgel, 2021, glasses Last Derm Exam: Dr. Denna Haggard, 2021, full body check   Patient Care Team: Unk Pinto, MD as PCP - General (Internal Medicine) Lavonna Monarch, MD as Consulting Physician (Dermatology)  Surgical History:  She has a  past surgical history that includes Cholecystectomy, laparoscopic (2004); Tonsillectomy and adenoidectomy (2002); Colposcopy (2002); and Cesarean section (N/A, 06/21/2016). Family History:  Herfamily history includes ALS in her maternal grandmother; ALS (age of onset: 89) in her maternal uncle; Alzheimer's disease in her maternal grandfather; Breast cancer (age of onset: 53) in her maternal grandmother; Depression in her mother; Leukemia (age of onset: 63) in her paternal aunt; Lung cancer in her paternal grandfather; Prostate cancer in her maternal grandfather; Suicidality in her mother; Thyroid cancer in her maternal aunt. Social History:  She reports that she quit smoking about 18 years ago. Her smoking use included cigarettes. She has a 0.60 pack-year smoking history. She has never used smokeless tobacco. She reports that she does not drink alcohol and does not use drugs.   Review  of Systems: Review of Systems  Constitutional:  Negative for malaise/fatigue and weight loss.  HENT:  Negative for hearing loss and tinnitus.   Eyes:  Negative for blurred vision and double vision.  Respiratory:  Negative for cough, shortness of breath and wheezing.   Cardiovascular:  Negative for chest pain, palpitations, orthopnea, claudication and leg swelling.  Gastrointestinal:  Negative for abdominal pain, blood in stool, constipation, diarrhea, heartburn, melena, nausea and vomiting.  Genitourinary: Negative.   Musculoskeletal:  Negative for joint pain and myalgias.  Skin:  Negative for rash.  Neurological:  Positive for tingling (when sleeping on side at night) and headaches (intermittent, tension). Negative for dizziness, sensory change and weakness.  Endo/Heme/Allergies:  Negative for polydipsia.  Psychiatric/Behavioral:  Negative for depression, substance abuse and suicidal ideas. The patient is not nervous/anxious and does not have insomnia.   All other systems reviewed and are negative.  Physical Exam: Estimated body mass index is 29.85 kg/m as calculated from the following:   Height as of this encounter: 5\' 2"  (1.575 m).   Weight as of this encounter: 163 lb 3.2 oz (74 kg). BP 110/78   Pulse 74   Temp (!) 97.5 F (36.4 C)   Ht 5\' 2"  (1.575 m)   Wt 163 lb 3.2 oz (74 kg)   SpO2 99%   BMI 29.85 kg/m    General Appearance: Well nourished, in no apparent distress.  Eyes: PERRLA, EOMs, conjunctiva no swelling or erythema Sinuses: No Frontal/maxillary tenderness  ENT/Mouth: Ext aud canals clear, normal light reflex with TMs without erythema, bulging. Good dentition. No erythema, swelling, or exudate on post pharynx. Tonsils not swollen or erythematous. Hearing normal.  Neck: Supple, thyroid questionably mildly enlarged without palpable nodules. No bruits  Respiratory: Respiratory effort normal, BS equal bilaterally without rales, rhonchi, wheezing or stridor.  Cardio: RRR  without murmurs, rubs or gallops. Brisk peripheral pulses without edema.  Chest: symmetric, with normal excursions and percussion.  Breasts: defer to GYN  Abdomen: Soft, nontender, no guarding, rebound, hernias, masses, or organomegaly.  Lymphatics: Non tender without lymphadenopathy.  Genitourinary: Defer to GYN Musculoskeletal: Full ROM all peripheral extremities,5/5 strength, and normal gait.  Skin: Warm, dry without rashes, lesions, ecchymosis. Neuro: Cranial nerves intact, reflexes equal bilaterally. Normal muscle tone, no cerebellar symptoms. Sensation intact.  Psych: Awake and oriented X 3, normal affect, Insight and Judgment appropriate.   EKG: 07/2019  WNL with sinus bradycardia baseline in system, reviewed, defer  Izora Ribas, NP 10:46 AM [facility]

## 2021-10-23 ENCOUNTER — Encounter: Payer: Self-pay | Admitting: Adult Health

## 2021-10-23 ENCOUNTER — Other Ambulatory Visit: Payer: Self-pay

## 2021-10-23 ENCOUNTER — Ambulatory Visit (INDEPENDENT_AMBULATORY_CARE_PROVIDER_SITE_OTHER): Payer: 59 | Admitting: Adult Health

## 2021-10-23 VITALS — BP 110/78 | HR 74 | Temp 97.5°F | Ht 62.0 in | Wt 163.2 lb

## 2021-10-23 DIAGNOSIS — E063 Autoimmune thyroiditis: Secondary | ICD-10-CM

## 2021-10-23 DIAGNOSIS — Z79899 Other long term (current) drug therapy: Secondary | ICD-10-CM

## 2021-10-23 DIAGNOSIS — Z0000 Encounter for general adult medical examination without abnormal findings: Secondary | ICD-10-CM | POA: Diagnosis not present

## 2021-10-23 DIAGNOSIS — F439 Reaction to severe stress, unspecified: Secondary | ICD-10-CM

## 2021-10-23 DIAGNOSIS — E559 Vitamin D deficiency, unspecified: Secondary | ICD-10-CM

## 2021-10-23 DIAGNOSIS — Z1389 Encounter for screening for other disorder: Secondary | ICD-10-CM

## 2021-10-23 DIAGNOSIS — E669 Obesity, unspecified: Secondary | ICD-10-CM

## 2021-10-23 DIAGNOSIS — Z130 Encounter for screening for diseases of the blood and blood-forming organs and certain disorders involving the immune mechanism: Secondary | ICD-10-CM

## 2021-10-23 DIAGNOSIS — E010 Iodine-deficiency related diffuse (endemic) goiter: Secondary | ICD-10-CM

## 2021-10-23 DIAGNOSIS — Z13228 Encounter for screening for other metabolic disorders: Secondary | ICD-10-CM

## 2021-10-23 DIAGNOSIS — Z0001 Encounter for general adult medical examination with abnormal findings: Secondary | ICD-10-CM

## 2021-10-23 NOTE — Patient Instructions (Addendum)
Ms. Pon[place]eleon , Thank you for taking time to come for your Annual Wellness Visit. I appreciate your ongoing commitment to your health goals. Please review the following plan we discussed and let me know if I can assist you in the future.   These are the goals we discussed:  Goals      DIET - INCREASE WATER INTAKE     Aim for 65+ fluid ounces of clear fluids daily      Exercise 3x per week (30 min per time)     Weight (lb) < 185 lb (83.9 kg)        This is a list of the screening recommended for you and due dates:  Health Maintenance  Topic Date Due   COVID-19 Vaccine (4 - Booster) 05/04/2020   Pneumococcal Vaccination (1 - PCV) 10/23/2044*   Pap Smear  03/22/2024   Tetanus Vaccine  07/19/2025   Flu Shot  Completed   Hepatitis C Screening: USPSTF Recommendation to screen - Ages 18-79 yo.  Completed   HIV Screening  Completed   HPV Vaccine  Aged Out  *Topic was postponed. The date shown is not the original due date.      Know what a healthy weight is for you (roughly BMI <25) and aim to maintain this  Aim for 7+ servings of fruits and vegetables daily  65-80+ fluid ounces of water or unsweet tea for healthy kidneys  Limit to max 1 drink of alcohol per day; avoid smoking/tobacco  Limit animal fats in diet for cholesterol and heart health - choose grass fed whenever available  Avoid highly processed foods, and foods high in saturated/trans fats  Aim for low stress - take time to unwind and care for your mental health  Aim for 150 min of moderate intensity exercise weekly for heart health, and weights twice weekly for bone health  Aim for 7-9 hours of sleep daily     High-Fiber Eating Plan Fiber, also called dietary fiber, is a type of carbohydrate. It is found foods such as fruits, vegetables, whole grains, and beans. A high-fiber diet can have many health benefits. Your health care provider may recommend a high-fiber diet to help: Prevent constipation. Fiber  can make your bowel movements more regular. Lower your cholesterol. Relieve the following conditions: Inflammation of veins in the anus (hemorrhoids). Inflammation of specific areas of the digestive tract (uncomplicated diverticulosis). A problem of the large intestine, also called the colon, that sometimes causes pain and diarrhea (irritable bowel syndrome, or IBS). Prevent overeating as part of a weight-loss plan. Prevent heart disease, type 2 diabetes, and certain cancers. What are tips for following this plan? Reading food labels  Check the nutrition facts label on food products for the amount of dietary fiber. Choose foods that have 5 grams of fiber or more per serving. The goals for recommended daily fiber intake include: Men (age 24 or younger): 34-38 g. Men (over age 49): 28-34 g. Women (age 84 or younger): 25-28 g. Women (over age 70): 22-25 g. Your daily fiber goal is _____________ g. Shopping Choose whole fruits and vegetables instead of processed forms, such as apple juice or applesauce. Choose a wide variety of high-fiber foods such as avocados, lentils, oats, and kidney beans. Read the nutrition facts label of the foods you choose. Be aware of foods with added fiber. These foods often have high sugar and sodium amounts per serving. Cooking Use whole-grain flour for baking and cooking. Cook with brown rice instead  of white rice. Meal planning Start the day with a breakfast that is high in fiber, such as a cereal that contains 5 g of fiber or more per serving. Eat breads and cereals that are made with whole-grain flour instead of refined flour or white flour. Eat brown rice, bulgur wheat, or millet instead of white rice. Use beans in place of meat in soups, salads, and pasta dishes. Be sure that half of the grains you eat each day are whole grains. General information You can get the recommended daily intake of dietary fiber by: Eating a variety of fruits, vegetables,  grains, nuts, and beans. Taking a fiber supplement if you are not able to take in enough fiber in your diet. It is better to get fiber through food than from a supplement. Gradually increase how much fiber you consume. If you increase your intake of dietary fiber too quickly, you may have bloating, cramping, or gas. Drink plenty of water to help you digest fiber. Choose high-fiber snacks, such as berries, raw vegetables, nuts, and popcorn. What foods should I eat? Fruits Berries. Pears. Apples. Oranges. Avocado. Prunes and raisins. Dried figs. Vegetables Sweet potatoes. Spinach. Kale. Artichokes. Cabbage. Broccoli. Cauliflower. Green peas. Carrots. Squash. Grains Whole-grain breads. Multigrain cereal. Oats and oatmeal. Brown rice. Barley. Bulgur wheat. Millet. Quinoa. Bran muffins. Popcorn. Rye wafer crackers. Meats and other proteins Navy beans, kidney beans, and pinto beans. Soybeans. Split peas. Lentils. Nuts and seeds. Dairy Fiber-fortified yogurt. Beverages Fiber-fortified soy milk. Fiber-fortified orange juice. Other foods Fiber bars. The items listed above may not be a complete list of recommended foods and beverages. Contact a dietitian for more information. What foods should I avoid? Fruits Fruit juice. Cooked, strained fruit. Vegetables Fried potatoes. Canned vegetables. Well-cooked vegetables. Grains White bread. Pasta made with refined flour. White rice. Meats and other proteins Fatty cuts of meat. Fried chicken or fried fish. Dairy Milk. Yogurt. Cream cheese. Sour cream. Fats and oils Butters. Beverages Soft drinks. Other foods Cakes and pastries. The items listed above may not be a complete list of foods and beverages to avoid. Talk with your dietitian about what choices are best for you. Summary Fiber is a type of carbohydrate. It is found in foods such as fruits, vegetables, whole grains, and beans. A high-fiber diet has many benefits. It can help to prevent  constipation, lower blood cholesterol, aid weight loss, and reduce your risk of heart disease, diabetes, and certain cancers. Increase your intake of fiber gradually. Increasing fiber too quickly may cause cramping, bloating, and gas. Drink plenty of water while you increase the amount of fiber you consume. The best sources of fiber include whole fruits and vegetables, whole grains, nuts, seeds, and beans. This information is not intended to replace advice given to you by your health care provider. Make sure you discuss any questions you have with your health care provider. Document Revised: 03/09/2020 Document Reviewed: 03/09/2020 Elsevier Patient Education  2022 [employer].

## 2021-10-24 LAB — COMPLETE METABOLIC PANEL WITH GFR
AG Ratio: 1.6 (calc) (ref 1.0–2.5)
ALT: 8 U/L (ref 6–29)
AST: 11 U/L (ref 10–30)
Albumin: 4.1 g/dL (ref 3.6–5.1)
Alkaline phosphatase (APISO): 55 U/L (ref 31–125)
BUN: 10 mg/dL (ref 7–25)
CO2: 24 mmol/L (ref 20–32)
Calcium: 9.4 mg/dL (ref 8.6–10.2)
Chloride: 107 mmol/L (ref 98–110)
Creat: 0.85 mg/dL (ref 0.50–0.99)
Globulin: 2.6 g/dL (calc) (ref 1.9–3.7)
Glucose, Bld: 63 mg/dL — ABNORMAL LOW (ref 65–99)
Potassium: 4.5 mmol/L (ref 3.5–5.3)
Sodium: 139 mmol/L (ref 135–146)
Total Bilirubin: 0.5 mg/dL (ref 0.2–1.2)
Total Protein: 6.7 g/dL (ref 6.1–8.1)
eGFR: 88 mL/min/{1.73_m2} (ref >=60)

## 2021-10-24 LAB — CBC WITH DIFFERENTIAL/PLATELET
Absolute Monocytes: 432 cells/uL (ref 200–950)
Basophils Absolute: 29 cells/uL (ref 0–200)
Basophils Relative: 0.6 %
Eosinophils Absolute: 182 cells/uL (ref 15–500)
Eosinophils Relative: 3.8 %
HCT: 42.4 % (ref 35.0–45.0)
Hemoglobin: 14.1 g/dL (ref 11.7–15.5)
Lymphs Abs: 1565 cells/uL (ref 850–3900)
MCH: 31.4 pg (ref 27.0–33.0)
MCHC: 33.3 g/dL (ref 32.0–36.0)
MCV: 94.4 fL (ref 80.0–100.0)
MPV: 10.2 fL (ref 7.5–12.5)
Monocytes Relative: 9 %
Neutro Abs: 2592 cells/uL (ref 1500–7800)
Neutrophils Relative %: 54 %
Platelets: 292 10*3/uL (ref 140–400)
RBC: 4.49 10*6/uL (ref 3.80–5.10)
RDW: 11.9 % (ref 11.0–15.0)
Total Lymphocyte: 32.6 %
WBC: 4.8 10*3/uL (ref 3.8–10.8)

## 2021-10-24 LAB — URINALYSIS, ROUTINE W REFLEX MICROSCOPIC
Bilirubin Urine: NEGATIVE
Glucose, UA: NEGATIVE
Hgb urine dipstick: NEGATIVE
Ketones, ur: NEGATIVE
Leukocytes,Ua: NEGATIVE
Nitrite: NEGATIVE
Protein, ur: NEGATIVE
Specific Gravity, Urine: 1.01 (ref 1.001–1.035)
pH: >=8.5 — AB (ref 5.0–8.0)

## 2021-10-24 LAB — MAGNESIUM: Magnesium: 2.1 mg/dL (ref 1.5–2.5)

## 2021-10-24 LAB — VITAMIN D 25 HYDROXY (VIT D DEFICIENCY, FRACTURES): Vit D, 25-Hydroxy: 49 ng/mL (ref 30–100)

## 2021-11-04 ENCOUNTER — Other Ambulatory Visit: Payer: Self-pay | Admitting: Adult Health

## 2022-01-22 ENCOUNTER — Ambulatory Visit (INDEPENDENT_AMBULATORY_CARE_PROVIDER_SITE_OTHER): Payer: 59 | Admitting: Internal Medicine

## 2022-01-22 ENCOUNTER — Other Ambulatory Visit: Payer: Self-pay

## 2022-01-22 ENCOUNTER — Encounter: Payer: Self-pay | Admitting: Internal Medicine

## 2022-01-22 VITALS — BP 110/70 | HR 69 | Ht 62.0 in | Wt 159.6 lb

## 2022-01-22 DIAGNOSIS — E010 Iodine-deficiency related diffuse (endemic) goiter: Secondary | ICD-10-CM

## 2022-01-22 DIAGNOSIS — E063 Autoimmune thyroiditis: Secondary | ICD-10-CM

## 2022-01-22 LAB — TSH: TSH: 1.41 u[IU]/mL (ref 0.35–5.50)

## 2022-01-22 LAB — T4, FREE: Free T4: 0.89 ng/dL (ref 0.60–1.60)

## 2022-01-22 LAB — T3, FREE: T3, Free: 3.1 pg/mL (ref 2.3–4.2)

## 2022-01-22 NOTE — Progress Notes (Signed)
Patient ID: [name] [name], female   DOB: June 03, 1981, 41 y.o.   MRN: [id number]   This visit occurred during the SARS-CoV-2 public health emergency.  Safety protocols were in place, including screening questions prior to the visit, additional usage of staff PPE, and extensive cleaning of exam room while observing appropriate contact time as indicated for disinfecting solutions.   HPI  [name] [name] is a 41 y.o.-year-old female, initially referred by her PCP, Lucky Cowboy, MD, returning for follow-up for Hashimoto's thyroiditis and goiter.  Last visit 1 year ago.  Interim history: Before last OV, in 09-08/2020 >> started [facility] 08/2020.  She lost a total of 40 lbs since started.  She is not on the maximum Wegovy dose (but on 1.7 mg weekly).  She also adjusted her diet. She feels well at today's visit, only with occasional fatigue. 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  Reviewed history: She was diagnosed with Hashimoto's thyroiditis in 2017.  She did not require thyroid hormones yet.  We started selenium 200 mcg daily in 05/2019.  She did not feel different on this, but her thyroid antibodies decreased.  Reviewed her TFTs: Lab Results  Component Value Date   TSH 1.03 07/31/2021   TSH 1.20 01/16/2021   TSH 1.32 06/06/2020   TSH 1.31 12/07/2019   TSH 1.31 05/24/2019   TSH 1.43 02/04/2019   TSH 1.53 08/05/2018   TSH 0.87 11/05/2016   TSH 1.302 10/25/2013   TSH 1.289 09/02/2012   FREET4 0.97 07/31/2021   FREET4 0.89 01/16/2021   FREET4 0.89 06/06/2020   FREET4 0.84 12/07/2019   FREET4 0.69 05/24/2019    TPO antibodies were elevated during her pregnancy and they remained elevated after giving birth:  Component     Latest Ref Rng & Units 11/05/2016  Thyroglobulin Ab     <2 IU/mL <1  Thyroperoxidase Ab SerPl-aCnc     <9 IU/mL 139 (H)   Component     Latest Ref Rng & Units 05/24/2019   Thyroperoxidase Ab SerPl-aCnc     <9 IU/mL 42 (H)  Thyroglobulin Ab     < or = 1 IU/mL 1   Component     Latest Ref Rng & Units 12/07/2019  Thyroglobulin Ab     < or = 1 IU/mL <1  Thyroperoxidase Ab SerPl-aCnc     <9 IU/mL 36 (H)   Component     Latest Ref Rng & Units 01/16/2021  Thyroperoxidase Ab SerPl-aCnc     <9 IU/mL 24 (H)   Thyroid ultrasound (04/19/2019): Mildly heterogeneous and enlarged thyroid gland, without nodules Parenchymal Echotexture: Mildly heterogenous Isthmus: 0.4 cm Right lobe: 4.6 x 1.7 x 1.6 cm Left lobe: 4.7 x 1.4 x 1.3 cm  She describes a long history of colon - weight gain - gained 20 lbs when moved to [place] for 1 year, then moved back in 06/2018 lost 10 lbs. Lowest weight 160 lb. -Fatigue -Cold intolerance -Depression/anxiety- on Lexapro.  -Constipation- occasional -Hair loss- occasional. Hair loss increased after Covid19 in 10/2020.  Pt denies: - feeling nodules in neck - hoarseness - dysphagia - choking - SOB with lying down  She has no family history of thyroid disease but FH of thyroid cancer + M aunt  - had hemithyroidectomy.  She has a family history of autoimmune disease in mother (psoriasis).   No recent steroid use.. No history of radiation therapy to head or neck.  No iodine supplements or biotin.  Pt. also has a history of cholecystectomy, C-section with her son in 2017.  Therapist: Dr. Cyndia Skeeters.  ROS: + see HPI + weight loss + acid reflux  I reviewed pt's medications, allergies, PMH, social hx, family hx, and changes were documented in the history of present illness. Otherwise, unchanged from my initial visit note.  Past Medical History:  Diagnosis Date   Abnormal Pap smear 2002   Colpo and Laser HPV   Former smoker    Headache    migraines   HSV-1 (herpes simplex virus 1) infection 4/11   positive on peri-anal c & s   Hx of migraines 03/07/2016   Obesity    Vaginal Pap smear, abnormal    Past Surgical History:  Procedure  Laterality Date   CESAREAN SECTION N/A 06/21/2016   Procedure: CESAREAN SECTION;  Surgeon: Jaymes Graff, MD;  Location: [facility];  Service: Obstetrics;  Laterality: N/A;   CHOLECYSTECTOMY, LAPAROSCOPIC  2004   COLPOSCOPY  2002   HPV, laser   TONSILLECTOMY AND ADENOIDECTOMY  2002   Social History   Socioeconomic History   Marital status: Single    Spouse name: Not on file   Number of children: 1   Years of education: Not on file   Highest education level: Not on file  Occupational History    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     Cholecalciferol (VITAMIN D) 50 MCG (2000 UT) tablet Take 2,000 Units by mouth daily.     escitalopram (LEXAPRO) 10 MG tablet TAKE 1 TABLET(10 MG) BY MOUTH DAILY 90 tablet 1   meloxicam (MOBIC) 15 MG tablet Take 1/2 to 1 tablet Daily with Food for Pain & Inflammation (Patient taking differently: as needed. Take 1/2 to 1 tablet Daily with Food for Pain & Inflammation) 90 tablet 1   montelukast (SINGULAIR) 10 MG tablet TAKE 1 TABLET BY MOUTH DAILY FOR ALLERGIES 90 tablet 3   selenium 50 MCG TABS tablet Take 200 mcg by mouth daily.      topiramate (TOPAMAX) 25 MG tablet TAKE 1 TABLET BY MOUTH AT DINNER AND 2 TABLETS AT BEDTIME FOR WEIGHT 270 tablet 1   WEGOVY 1.7 MG/0.75ML SOAJ INJECT 1.7MG  INTO THE SKIN ONCE A WEEK. 3 mL 2   No current facility-administered medications on file prior  to visit.   Allergies  Allergen Reactions   Other Other (See Comments)    steroid eye drops-causes glaucoma    Family History  Problem Relation Age of Onset   Depression Mother    Suicidality Mother    Lung cancer Paternal Grandfather    ALS Maternal Grandmother    Breast cancer Maternal Grandmother 56   Alzheimer's disease Maternal Grandfather    Prostate cancer Maternal Grandfather    Thyroid  cancer Maternal Aunt    ALS Maternal Uncle 99   Leukemia Paternal Aunt 31       CLL   PE: There were no vitals taken for this visit. Wt Readings from Last 3 Encounters:  10/23/21 163 lb 3.2 oz (74 kg)  07/13/21 160 lb (72.6 kg)  02/27/21 178 lb (80.7 kg)   Constitutional: overweight, in NAD Eyes: PERRLA, EOMI, no exophthalmos ENT: moist mucous membranes, no thyromegaly, no cervical lymphadenopathy Cardiovascular: RRR, No MRG Respiratory: CTA B Musculoskeletal: no deformities, strength intact in all 4 Skin: moist, warm, no rashes Neurological: no tremor with outstretched hands, DTR normal in all 4  ASSESSMENT: 1. Hashimoto thyroiditis  2. Thyromegaly  PLAN: 1. Hashimoto thyroiditis -Patient has a history of elevated thyroid antibodies and also a heterogeneous aspect of her thyroid on ultrasound.  She does not have a personal history of radiation therapy to head or neck.  She does have an aunt with thyroid cancer (unclear type). -Her TFTs have been normal so far and she did not require levothyroxine treatment -Her antithyroid antibodies were elevated and we started selenium in 05/2019.  Her thyroid antibodies improved, but at last check, they were still slightly elevated.  She continues on selenium but we discussed that if her antibodies normalized, we can give her a try without selenium -We discussed in the past about other measures to improve her immune system (stress reduction; relaxation; diet-discussed reduction of dairy, gluten, meat, and increasing fruits and vegetables;  exercise; sleep) -At today's visit, she does not report hypothyroid symptoms -We will recheck her TFTs today -She is aware how to take levothyroxine correctly in case we need to start: every day, with water, at least 30 minutes before breakfast, separated by at least 4 hours from: - acid reflux medications - calcium - iron - multivitamins - RTC in 1 year  2.  Thyromegaly -Her thyroid gland appeared slightly enlarged on the most recent thyroid ultrasound from 04/2019), but without nodules.  On physical exam, I do not feel a goiter -No neck compression symptoms, but she occasionally has dysphagia and sore throat (she also has postnasal drip and reflux) -We discussed that thyroid enlargement can be related to Hashimoto's thyroiditis and has a fluctuating pattern -No further investigation is needed at this point  If stopping selenium, we need to recheck her tests and antibodies in 2 months.  Component     Latest Ref Rng & Units 01/22/2022  TSH     0.35 - 5.50 uIU/mL 1.41  Thyroperoxidase Ab SerPl-aCnc     <9 IU/mL 56 (H)  Thyroglobulin Ab     < or = 1 IU/mL <1  Triiodothyronine,Free,Serum     2.3 - 4.2 pg/mL 3.1  T4,Free(Direct)     0.60 - 1.60 ng/dL 7.25  TPO antibodies are higher.  ATA antibodies are undetectable.  TFTs are normal.  I would suggest to continue selenium for now.  Carlus Pavlov, MD PhD [facility] Endocrinology

## 2022-01-22 NOTE — Patient Instructions (Addendum)
Please stop at the lab. ? ?Continue selenium 200 mcg daily. ? ?Please return for 1 year. ? ?

## 2022-01-24 ENCOUNTER — Encounter: Payer: Self-pay | Admitting: Internal Medicine

## 2022-01-24 LAB — THYROGLOBULIN ANTIBODY: Thyroglobulin Ab: <1 IU/mL (ref <=1)

## 2022-01-24 LAB — THYROID PEROXIDASE ANTIBODY: Thyroperoxidase Ab SerPl-aCnc: 56 IU/mL — ABNORMAL HIGH (ref <9)

## 2022-02-20 ENCOUNTER — Other Ambulatory Visit: Payer: Self-pay | Admitting: Obstetrics and Gynecology

## 2022-02-27 ENCOUNTER — Ambulatory Visit: Payer: 59 | Admitting: Adult Health

## 2022-03-01 ENCOUNTER — Encounter: Payer: Self-pay | Admitting: Adult Health

## 2022-03-01 ENCOUNTER — Ambulatory Visit (INDEPENDENT_AMBULATORY_CARE_PROVIDER_SITE_OTHER): Payer: 59 | Admitting: Adult Health

## 2022-03-01 VITALS — BP 110/76 | HR 104 | Temp 97.7°F | Wt 165.0 lb

## 2022-03-01 DIAGNOSIS — E063 Autoimmune thyroiditis: Secondary | ICD-10-CM | POA: Diagnosis not present

## 2022-03-01 NOTE — Progress Notes (Signed)
6 MONTH FOLLOW UP ? ?Assessment and Plan: ? ? ?Obesity - BMI 30 ?Long discussion about weight loss, diet, and exercise ?Excellent progress with wegovy and counseling ?Recommended diet heavy in fruits and veggies and low in animal meats, cheeses, and dairy products, appropriate calorie intake ?Discussed appropriate weight for height and initial goal (160lb) ?She is on topamax, wegovy 1.7 mg/week - was doing well, currently maintaining with reduced dosing due to medication cost/high deductible ?Resume weekly dosing once she has met deductible ?She will start tracking meals; caloric goal of 1500-1600 kcal/day ?Protein 60 g/day, fiber 25-30g/day, healthy fat with each meal ?Try to add resistance exercises 3 days a week  ?Follow up 3 months  ? ?Hashimoto's thyroiditis ?Monitor closely; following with Dr. Cruzita Lederer as well  ?- referral placed back per insurance requirements ?Thyroid US showed heterogenous enlargement ?Defer TSH to endocrine, q50m? ?Situational stress ?Continue medications: lexapro 10 mg ?Followed by Dr. LArdath Sax?Stress management techniques discussed, increase water, good sleep hygiene discussed, increase exercise, and increase veggies.  ? ?History of HSV-1 infection ?Antivirals PRN; use barrier with new partners ? ?Vitamin D deficiency ?Continue daily supplement;  ? ?B12 deficiency ?- start 500 mcg SL daily  ? ?Discussed med's effects and SE's.  ?Over 20 minutes of exam, counseling, chart review, and moderate level critical decision making was performed this visit.  ? ?Future Appointments  ?Date Time Provider DMorrow ?10/24/2022  9:00 AM Mull, DTownsend Roger NP [facility] None  ?01/23/2023  9:00 AM GPhilemon Kingdom MD [facility] None  ? ? ? ?HPI  ?41y.o. female  presents for 6 month follow up on obesity, vit D def, Hashimoto's thyroiditis.  ? ?Menstrual cycles have been more irregular, has seen GYN, started progesterone pill, has follow up in 2 months.  ? 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  ? ?she has a diagnosis of situational depression/anxiety, on lexapro 10 mg daily and doing well with this, helps with stress and racing thoughts. Uses melatonin occasionally. she is seeing a therapist Dr. LArdath Saxq4-6 weeks as needed.   ?  ?BMI is Body mass index is 30.18 kg/m?., she is working on diet and exercise, [place]2016, 207 lb in 01/2020 recently. 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 ?Today their BP is BP: 110/76 ?She does workout, trying to be more active and walking with son. She denies chest pain, shortness of breath, dizziness.  ? ?The cholesterol last visit was:   ?  Lab Results  ?Component Value Date  ? CHOL 162 08/23/2020  ? HDL 59 08/23/2020  ? [place] 83 08/23/2020  ? TRIG 104 08/23/2020  ? CHOLHDL 2.7 08/23/2020  ? ?Last A1C in the office was:  ?Lab Results  ?Component Value Date  ? HGBA1C 5.2 08/23/2020  ? ?Last GFR: ?Lab Results  ?Component Value Date  ? GFRNONAA 92 08/23/2020   ? ?She has hx of + thyroid antibody/Hashimoto's, had [place] 2020 without nodules, on selenium 200 mcg follows with Dr. Cruzita Lederer, just had check last month:  ?Lab Results  ?Component Value Date  ? TSH 1.41 01/22/2022  ? ?Patient is on Vitamin D supplement, taking 2000 IU daily    ?Lab Results  ?Component Value Date  ? VD25OH 49 10/23/2021  ?   ?Low B12, hasn't started supplement, receptive to starting 500 mcg SL daily  ?Lab Results  ?Component Value Date  ? [id number] 386 11/05/2016  ? ? ? ?Current Medications:  ?Current Outpatient Medications on File Prior to Visit  ?Medication Sig Dispense Refill  ? ascorbic acid (VITAMIN C) 500 MG tablet Take 500 mg by mouth daily.    ? Cholecalciferol (VITAMIN D) 50 MCG (2000 UT) tablet Take 2,000 Units by mouth daily.    ? Drospirenone (SLYND) 4 MG TABS Take by mouth daily.    ? escitalopram (LEXAPRO) 10 MG tablet TAKE 1 TABLET(10 MG) BY MOUTH DAILY 90 tablet 1  ? meloxicam (MOBIC) 15 MG tablet Take 1/2 to 1 tablet Daily with Food for Pain & Inflammation (Patient taking differently: as needed. Take 1/2 to 1 tablet Daily with Food for Pain & Inflammation) 90 tablet 1  ? montelukast (SINGULAIR) 10 MG tablet TAKE 1 TABLET BY MOUTH DAILY FOR ALLERGIES 90 tablet 3  ? selenium 50 MCG TABS tablet Take 200 mcg by mouth daily.     ? topiramate (TOPAMAX) 25 MG tablet TAKE 1 TABLET BY MOUTH AT DINNER AND 2 TABLETS AT BEDTIME FOR WEIGHT 270 tablet 1  ? WEGOVY 1.7 MG/0.75ML SOAJ INJECT 1.7MG INTO THE SKIN ONCE A WEEK. 3 mL 2  ? ?No current facility-administered medications on file prior to visit.  ? ?Allergies:  ?Allergies  ?Allergen Reactions  ? Other Other (See Comments)  ?  steroid eye drops-causes glaucoma   ? ?Medical History:  ?She has Situational stress; Hashimoto's thyroiditis; History of HSV-1 infection; Vitamin D deficiency; Obesity (BMI 35.0-39.9 without comorbidity); and Thyromegaly on their problem list. ? ?Surgical History:  ?She has a past surgical history that includes  Cholecystectomy, laparoscopic (2004); Tonsillectomy and adenoidectomy (2002); Colposcopy (2002); and Cesarean section (N/A, 06/21/2016). ?Family History:  ?Herfamily history includes ALS in her maternal grandmother; ALS (age of onset: 45) in her maternal uncle; Alzheimer's disease in her maternal grandfather; Breast cancer (age of onset: 46) in her maternal grandmother; Depression in her mother; Leukemia (age of onset: 5) in her paternal aunt; Lung cancer in her paternal grandfather; Prostate cancer in her maternal grandfather; Suicidality in her mother; Thyroid cancer in her maternal aunt. ?Social History:  ?She reports that she quit smoking about 18 years ago. Her smoking use included cigarettes. She has a 0.60 pack-year smoking history. She has never used smokeless tobacco. She reports that she does not drink alcohol and does not use drugs. ? ? ?Review of Systems: ?Review of Systems  ?Constitutional:  Negative for malaise/fatigue and weight loss.  ?HENT:  Negative for hearing loss and tinnitus.   ?Eyes:  Negative for blurred vision and double vision.  ?  Respiratory:  Negative for cough, shortness of breath and wheezing.   ?Cardiovascular:  Negative for chest pain, palpitations, orthopnea, claudication and leg swelling.  ?Gastrointestinal:  Negative for abdominal pain, blood in stool, constipation, diarrhea, heartburn, melena, nausea and vomiting.  ?Genitourinary: Negative.   ?Musculoskeletal:  Negative for joint pain and myalgias.  ?Skin:  Negative for rash.  ?Neurological:  Negative for dizziness, tingling, sensory change, weakness and headaches.  ?Endo/Heme/Allergies:  Negative for polydipsia.  ?Psychiatric/Behavioral:  Negative for depression, substance abuse and suicidal ideas. The patient is not nervous/anxious and does not have insomnia.   ?All other systems reviewed and are negative. ? ?Physical Exam: ?Estimated body mass index is 30.18 kg/m? as calculated from the following: ?  Height as of 01/22/22: _0   (1.575 m). ?  Weight as of this encounter: 165 lb (74.8 kg). ?BP 110/76   Pulse (!) 104   Temp 97.7 ?F (36.5 ?C)   Wt 165 lb (74.8 kg)   SpO2 99%   BMI 30.18 kg/m?   ? ?General Appearance: Well nourished, in no appar

## 2022-03-09 ENCOUNTER — Other Ambulatory Visit: Payer: Self-pay | Admitting: Adult Health

## 2022-06-14 ENCOUNTER — Other Ambulatory Visit: Payer: Self-pay | Admitting: Nurse Practitioner

## 2022-07-09 NOTE — Progress Notes (Unsigned)
6 MONTH FOLLOW UP  Assessment and Plan:   Obesity - BMI 33 Long discussion about weight loss, diet, and exercise Excellent progress with wegovy and counseling Recommended diet heavy in fruits and veggies and low in animal meats, cheeses, and dairy products, appropriate calorie intake Discussed appropriate weight for height and initial goal (160lb) She is on topamax, wegovy 1 mg/week with excellent results Continue to monitor for goal of 1lb/week  Follow up 6 months unless hitting plateau or concerns  Hashimoto's thyroiditis Monitor closely; following with Dr. Elvera Lennox as well per patient preference will be seeing annually Thyroid US showed heterogenous enlargement Defer TSH to endocrine, q48m  Situational stress Continue medications: lexapro 10 mg Followed by Dr. Cyndia Skeeters Stress management techniques discussed, increase water, good sleep hygiene discussed, increase exercise, and increase veggies.   History of HSV-1 infection Antivirals PRN; use barrier with new partners  Vitamin D deficiency Continue daily supplement;   Discussed med's effects and SE's.  Over 20 minutes of exam, counseling, chart review, and moderate level critical decision making was performed this visit.   Future Appointments  Date Time Provider Department Center  07/10/2022  9:30 AM Raynelle Dick, NP [facility] None  10/24/2022  9:00 AM Raynelle Dick, NP [facility] None  01/23/2023  9:00 AM Carlus Pavlov, MD [facility] None     HPI  41 y.o. female  presents for 6 month follow up on obesity, vit D def, Hashimoto's thyroiditis.   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   she has a diagnosis of situational depression/anxiety, on lexapro 10 mg daily and doing well with this, helps with stress and racing thoughts. Uses melatonin occasionally. she is seeing a therapist Dr. Cyndia Skeeters q4-6 weeks as needed.     BMI is There is no height or weight on file  to calculate BMI., she is working on diet and exercise, has lost some weight since returning from [place]. Peak weight 232 lb in 06/2016, 207 lb in 01/2020 recently. She had limited benefit on phentermine/topamax  difficult to get off high dose topamax in past, also takes for headache/migraine.  Recently started on wegovy, up to 1 mg/week, down 4 lb since last check.  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  Doing beach body videos 3 days a week, walking on treadmill No alcohol, 8 glasses of water daily Reduced diet soda - rare, maybe 1/week  Would like to get down to around 160 lb Wt Readings from Last 3 Encounters:  03/01/22 165 lb (74.8 kg)  01/22/22 159 lb 9.6 oz (72.4 kg)  10/23/21 163 lb 3.2 oz (74 kg)   Today their BP is   She does workout, trying to be more active and walking with son. She denies chest pain, shortness of breath, dizziness.   The cholesterol last visit was:   Lab Results  Component Value Date   CHOL 162 08/23/2020   HDL 59 08/23/2020   LDLCALC 83 08/23/2020   TRIG 104 08/23/2020   CHOLHDL 2.7 08/23/2020   Last P6P in the office was:  Lab Results  Component Value Date   HGBA1C 5.2 08/23/2020   Last GFR: Lab Results  Component Value Date   GFRNONAA 92 08/23/2020   She has hx of + thyroid antibody/Hashimoto's, had  [place] 2020 without nodules, on selenium 200 mcg follows with Dr. Cruzita Lederer:  Lab Results  Component Value Date   TSH 1.41 01/22/2022   Patient is on Vitamin D supplement, taking 2000 IU daily    Lab Results  Component Value Date   VD25OH 49 10/23/2021     Discussed getting on B12 supplement Lab Results  Component Value Date   VITAMINB12 386 11/05/2016     Current Medications:  Current Outpatient Medications on File Prior to Visit   Medication Sig Dispense Refill   ascorbic acid (VITAMIN C) 500 MG tablet Take 500 mg by mouth daily.     Cholecalciferol (VITAMIN D) 50 MCG (2000 UT) tablet Take 2,000 Units by mouth daily.     Drospirenone (SLYND) 4 MG TABS Take by mouth daily.     escitalopram (LEXAPRO) 10 MG tablet TAKE 1 TABLET(10 MG) BY MOUTH DAILY 90 tablet 1   meloxicam (MOBIC) 15 MG tablet Take 1/2 to 1 tablet Daily with Food for Pain & Inflammation (Patient taking differently: as needed. Take 1/2 to 1 tablet Daily with Food for Pain & Inflammation) 90 tablet 1   montelukast (SINGULAIR) 10 MG tablet TAKE 1 TABLET BY MOUTH DAILY FOR ALLERGIES 90 tablet 3   selenium 50 MCG TABS tablet Take 200 mcg by mouth daily.      topiramate (TOPAMAX) 25 MG tablet TAKE 1 TABLET BY MOUTH AT DINNER AND 2 TABLETS AT BEDTIME FOR WEIGHT 270 tablet 1   WEGOVY 1.7 MG/0.75ML SOAJ INJECT 1.7 MG UNDER THE SKIN ONCE EVERY WEEK 9 mL 3   No current facility-administered medications on file prior to visit.   Allergies:  Allergies  Allergen Reactions   Other Other (See Comments)    steroid eye drops-causes glaucoma    Medical History:  She has Situational stress; Hashimoto's thyroiditis; History of HSV-1 infection; Vitamin D deficiency; Obesity (BMI 35.0-39.9 without comorbidity); and Thyromegaly on their problem list.  Surgical History:  She has a past surgical history that includes Cholecystectomy, laparoscopic (2004); Tonsillectomy and adenoidectomy (2002); Colposcopy (2002); and Cesarean section (N/A, 06/21/2016). Family History:  Herfamily history includes ALS in her maternal grandmother; ALS (age of onset: 84) in her maternal uncle; Alzheimer's disease in her maternal grandfather; Breast cancer (age of onset: 77) in her maternal grandmother; Depression in her mother; Leukemia (age of onset: 38) in her paternal aunt; Lung cancer in her paternal grandfather; Prostate cancer in her maternal grandfather; Suicidality in her mother; Thyroid  cancer in her maternal aunt. Social History:  She reports that she quit smoking about 19 years ago. Her smoking use included cigarettes. She has a 0.60 pack-year smoking history. She has never used smokeless tobacco. She reports that she does not drink alcohol and does not use drugs.   Review of Systems: Review of Systems  Constitutional:  Negative for malaise/fatigue and weight loss.  HENT:  Negative for hearing loss and tinnitus.   Eyes:  Negative for blurred vision and double vision.  Respiratory:  Negative for cough, shortness of breath and wheezing.   Cardiovascular:  Negative for chest pain, palpitations, orthopnea, claudication and leg swelling.  Gastrointestinal:  Negative for abdominal pain, blood in stool, constipation, diarrhea, heartburn, melena, nausea and vomiting.  Genitourinary: Negative.   Musculoskeletal:  Negative for joint pain and myalgias.  Skin:  Negative for rash.  Neurological:  Negative for dizziness, tingling, sensory change, weakness and headaches.  Endo/Heme/Allergies:  Negative for polydipsia.  Psychiatric/Behavioral:  Negative for depression, substance abuse and  suicidal ideas. The patient is not nervous/anxious and does not have insomnia.   All other systems reviewed and are negative.   Physical Exam: Estimated body mass index is 30.18 kg/m as calculated from the following:   Height as of 01/22/22: 5\' 2"  (1.575 m).   Weight as of 03/01/22: 165 lb (74.8 kg). There were no vitals taken for this visit.   General Appearance: Well nourished, in no apparent distress.  Eyes: PERRLA, EOMs, conjunctiva no swelling or erythema Sinuses: No Frontal/maxillary tenderness  ENT/Mouth: Ext aud canals clear, normal light reflex with TMs without erythema, bulging. Good dentition. No erythema, swelling, or exudate on post pharynx. Tonsils not swollen or erythematous. Hearing normal.  Neck: Supple, thyroid mildly enlarged without palpable nodules. No bruits  Respiratory:  Respiratory effort normal, BS equal bilaterally without rales, rhonchi, wheezing or stridor.  Cardio: RRR without murmurs, rubs or gallops. Brisk peripheral pulses without edema.  Chest: symmetric, with normal excursions and percussion.  Abdomen: Soft, nontender, no guarding, rebound, hernias, masses, or organomegaly.  Lymphatics: Non tender without lymphadenopathy.  Musculoskeletal: Full ROM all peripheral extremities,5/5 strength, and normal gait.  Skin: Warm, dry without rashes, lesions, ecchymosis. Neuro: Cranial nerves intact, reflexes equal bilaterally. Normal muscle tone, no cerebellar symptoms. Sensation intact.  Psych: Awake and oriented X 3, normal affect, Insight and Judgment appropriate.     03/03/22 9:28 AM [facility]

## 2022-07-10 ENCOUNTER — Ambulatory Visit (INDEPENDENT_AMBULATORY_CARE_PROVIDER_SITE_OTHER): Payer: 59 | Admitting: Nurse Practitioner

## 2022-07-10 ENCOUNTER — Encounter: Payer: Self-pay | Admitting: Nurse Practitioner

## 2022-07-10 VITALS — BP 104/68 | HR 79 | Temp 97.9°F | Ht 62.0 in | Wt 175.8 lb

## 2022-07-10 DIAGNOSIS — F439 Reaction to severe stress, unspecified: Secondary | ICD-10-CM

## 2022-07-10 DIAGNOSIS — E063 Autoimmune thyroiditis: Secondary | ICD-10-CM | POA: Diagnosis not present

## 2022-07-10 DIAGNOSIS — E559 Vitamin D deficiency, unspecified: Secondary | ICD-10-CM | POA: Diagnosis not present

## 2022-07-10 DIAGNOSIS — R7309 Other abnormal glucose: Secondary | ICD-10-CM

## 2022-07-10 DIAGNOSIS — E669 Obesity, unspecified: Secondary | ICD-10-CM | POA: Diagnosis not present

## 2022-07-10 DIAGNOSIS — G43009 Migraine without aura, not intractable, without status migrainosus: Secondary | ICD-10-CM

## 2022-07-10 DIAGNOSIS — Z79899 Other long term (current) drug therapy: Secondary | ICD-10-CM

## 2022-07-10 MED ORDER — WEGOVY 2.4 MG/0.75ML ~~LOC~~ SOAJ: 2.4000 mg | SUBCUTANEOUS | 2 refills | Status: DC

## 2022-07-10 MED ORDER — BUPROPION HCL ER (XL) 150 MG PO TB24: 150.0000 mg | ORAL_TABLET | ORAL | 2 refills | Status: DC

## 2022-07-10 MED ORDER — TOPIRAMATE 25 MG PO TABS: ORAL_TABLET | ORAL | 1 refills | Status: DC

## 2022-07-11 LAB — COMPLETE METABOLIC PANEL WITH GFR
AG Ratio: 1.4 (calc) (ref 1.0–2.5)
ALT: 10 U/L (ref 6–29)
AST: 12 U/L (ref 10–30)
Albumin: 4.1 g/dL (ref 3.6–5.1)
Alkaline phosphatase (APISO): 60 U/L (ref 31–125)
BUN: 10 mg/dL (ref 7–25)
CO2: 26 mmol/L (ref 20–32)
Calcium: 9.7 mg/dL (ref 8.6–10.2)
Chloride: 106 mmol/L (ref 98–110)
Creat: 0.83 mg/dL (ref 0.50–0.99)
Globulin: 3 g/dL (calc) (ref 1.9–3.7)
Glucose, Bld: 62 mg/dL — ABNORMAL LOW (ref 65–99)
Potassium: 4.6 mmol/L (ref 3.5–5.3)
Sodium: 140 mmol/L (ref 135–146)
Total Bilirubin: 0.7 mg/dL (ref 0.2–1.2)
Total Protein: 7.1 g/dL (ref 6.1–8.1)
eGFR: 90 mL/min/{1.73_m2} (ref >=60)

## 2022-07-11 LAB — CBC WITH DIFFERENTIAL/PLATELET
Absolute Monocytes: 470 cells/uL (ref 200–950)
Basophils Absolute: 30 cells/uL (ref 0–200)
Basophils Relative: 0.6 %
Eosinophils Absolute: 240 cells/uL (ref 15–500)
Eosinophils Relative: 4.8 %
HCT: 42 % (ref 35.0–45.0)
Hemoglobin: 14.2 g/dL (ref 11.7–15.5)
Lymphs Abs: 1470 cells/uL (ref 850–3900)
MCH: 31.1 pg (ref 27.0–33.0)
MCHC: 33.8 g/dL (ref 32.0–36.0)
MCV: 92.1 fL (ref 80.0–100.0)
MPV: 9.8 fL (ref 7.5–12.5)
Monocytes Relative: 9.4 %
Neutro Abs: 2790 cells/uL (ref 1500–7800)
Neutrophils Relative %: 55.8 %
Platelets: 300 10*3/uL (ref 140–400)
RBC: 4.56 10*6/uL (ref 3.80–5.10)
RDW: 12 % (ref 11.0–15.0)
Total Lymphocyte: 29.4 %
WBC: 5 10*3/uL (ref 3.8–10.8)

## 2022-07-11 LAB — TSH: TSH: 1.36 mIU/L

## 2022-07-11 LAB — VITAMIN D 25 HYDROXY (VIT D DEFICIENCY, FRACTURES): Vit D, 25-Hydroxy: 47 ng/mL (ref 30–100)

## 2022-07-15 ENCOUNTER — Encounter: Payer: Self-pay | Admitting: Nurse Practitioner

## 2022-07-15 ENCOUNTER — Other Ambulatory Visit: Payer: Self-pay | Admitting: Nurse Practitioner

## 2022-07-15 DIAGNOSIS — L709 Acne, unspecified: Secondary | ICD-10-CM

## 2022-07-15 MED ORDER — TRETINOIN 0.05 % EX CREA: TOPICAL_CREAM | Freq: Every day | CUTANEOUS | 0 refills | Status: DC

## 2022-09-26 ENCOUNTER — Telehealth: Payer: Self-pay

## 2022-09-26 NOTE — Telephone Encounter (Signed)
[employer] prior auth approved through 03/27/23.    [employer] prior auth completed and submitted.

## 2022-10-24 ENCOUNTER — Encounter: Payer: BC Managed Care – PPO | Admitting: Nurse Practitioner

## 2022-10-24 NOTE — Progress Notes (Signed)
Complete Physical  Assessment and Plan:  Diagnoses and all orders for this visit:  Encounter for Annual Physical Exam with abnormal findings Due annually  Health Maintenance reviewed Healthy lifestyle reviewed and goals set  Overweight - BMI 29 Excellent progress! Weight down from 207 lb -  Long discussion about weight loss, diet, and exercise Recommended diet heavy in fruits and veggies and low in animal meats, cheeses, and dairy products, appropriate calorie intake Discussed appropriate weight for height and next goal (150 lb) She is on topamax Wegovy 1.7 mg weekly with perceived benefit; some SE; reduce dose back down once available again High fiber diet, weight bearing resistance exercises encouraged  Hashimoto's thyroiditis Monitor closely; following with Dr. Cruzita Lederer as well per patient preference will be seeing annually Thyroid US showed heterogenous enlargement Defer TSH to endocrine, q13m  Situational stress Continue medications: lexapro 10 mg Followed by Dr. LArdath SaxStress management techniques discussed, increase water, good sleep hygiene discussed, increase exercise, and increase veggies.   History of HSV-1 infection Antivirals PRN; use barrier with new partners  Vitamin D deficiency On supplement - Vitamin D  Abnormal Glucose Continue diet and exercise -     Hemoglobin A1c   Screening cholesterol level -     Lipid panel  Screening for hematuria or proteinuria -     Urinalysis w microscopic  Medication management -     CBC with Differential/Platelet -     COMPLETE METABOLIC PANEL WITH GFR - Magnesium     Discussed med's effects and SE's. Screening labs and tests as requested with regular follow-up as recommended. Over 40 minutes of exam, counseling, chart review, and complex, high level critical decision making was performed this visit.   Future Appointments  Date Time Provider DMcBaine 01/23/2023  9:00 AM GPhilemon Kingdom MD [facility]  None  10/28/2023 10:00 AM WAlycia Rossetti NP [facility] None     HPI  41y.o. female  presents for a complete physical and follow up for has Situational stress; Hashimoto's thyroiditis; History of HSV-1 infection; Vitamin D deficiency; Obesity (BMI 35.0-39.9 without comorbidity); and Thyromegaly on their problem list.   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  Follows with GYN Dr. DCharlesetta Garibaldifor PAPs (hx of abnormal) and mammo. Last in 03/22/2021 was normal.   she has a diagnosis of situational depression/anxiety.  Is not currently not on Wellbutrin has prescription. Uses melatonin occasionally. she is seeing a therapist Dr. LArdath Saxregularly.     BMI is Body mass index is 32.19 kg/m., she is working on diet and exercise, [place] 207 [place] in 01/[place].  No longer on phentermine, just restarted on  wegovy 2.4 mg/week with continued weight loss, had no nausea on Wegoy 1.7 so this is first time going to the highest dose. topamax 75 mg for weight loss, also helps with headache Not the best at eating, works long hours at a pharmacist- can skip meals No alcohol, 8 glasses of water daily Wt Readings from Last 3 Encounters:  10/25/22 176 lb (79.8 kg)  07/10/22 175 lb 12.8 oz (79.7 kg)  03/01/22 165 lb (74.8 kg)   Today their BP is BP: 100/70  BP Readings from Last 3 Encounters:  10/25/22 100/70  07/10/22 104/68  03/01/22 110/76  She does workout, trying to be  more active and walking with son. She denies chest pain, shortness of breath, dizziness.   The cholesterol last visit was:   Lab Results  Component Value Date   CHOL 162 08/23/2020   HDL 59 08/23/2020   LDLCALC 83 08/23/2020   TRIG 104 08/23/2020   CHOLHDL 2.7 08/23/2020   Last A1C in the office was:  Lab Results  Component Value Date   HGBA1C  5.2 08/23/2020   Last GFR: Lab Results  Component Value Date   EGFR 90 07/10/2022    She has hx of + thyroid antibody/Hashimoto's, had [place] 2020 without nodules, follows with Dr. Cruzita Lederer:  Lab Results  Component Value Date   TSH 1.36 07/10/2022   Patient is on Vitamin D supplement, taking 2000 IU daily    Lab Results  Component Value Date   VD25OH 47 07/10/2022       Current Medications:  Current Outpatient Medications on File Prior to Visit  Medication Sig Dispense Refill   ascorbic acid (VITAMIN C) 500 MG tablet Take 500 mg by mouth daily.     buPROPion (WELLBUTRIN XL) 150 MG 24 hr tablet Take 1 tablet (150 mg total) by mouth every morning. 30 tablet 2   Cholecalciferol (VITAMIN D) 50 MCG (2000 UT) tablet Take 2,000 Units by mouth daily.     Cyanocobalamin (B-12 PO) Take by mouth.     Drospirenone (SLYND) 4 MG TABS Take by mouth daily.     meloxicam (MOBIC) 15 MG tablet Take 1/2 to 1 tablet Daily with Food for Pain & Inflammation (Patient taking differently: as needed. Take 1/2 to 1 tablet Daily with Food for Pain & Inflammation) 90 tablet 1   montelukast (SINGULAIR) 10 MG tablet TAKE 1 TABLET BY MOUTH DAILY FOR ALLERGIES 90 tablet 3   selenium 50 MCG TABS tablet Take 200 mcg by mouth daily.      Semaglutide-Weight Management (WEGOVY) 2.4 MG/0.75ML SOAJ Inject 2.4 mg into the skin once a week. 9 mL 2   topiramate (TOPAMAX) 25 MG tablet TAKE 1 TABLET BY MOUTH AT DINNER AND 2 TABLETS AT BEDTIME FOR WEIGHT 270 tablet 1   tretinoin (RETIN-A) 0.05 % cream Apply topically at bedtime. 45 g 0   No current facility-administered medications on file prior to visit.   Allergies:  Allergies  Allergen Reactions   Other Other (See Comments)    steroid eye drops-causes glaucoma    Medical History:  She has Situational stress; Hashimoto's thyroiditis; History of HSV-1 infection; Vitamin D deficiency; Obesity (BMI 35.0-39.9 without comorbidity); and Thyromegaly on their problem  list. Health Maintenance:   Immunization History  Administered Date(s) Administered   HPV Quadrivalent 01/10/2006, 03/11/2006, 07/10/2006   Influenza-Unspecified 08/24/2018, 08/18/2021   Moderna Sars-Covid-2 Vaccination 12/06/2019, 01/07/2020   Tdap 06/19/2015    Tetanus: 2016 Flu vaccine: will get at work, had 08/2021 HPV: 3/3, 2007 Covid 19: 2/2, had booster 02/2021, will send date  LMP: No LMP recorded. Patient is perimenopausal. Pap: 03/22/2021 at GYN, HPV neg, Dr. Charlesetta Garibaldi, hx of abnormal in the past, s/p laser/colposcopy, getting q3y MGM: 03/22/2022, GYN DEXA: -  Thyroid US: 04/22/2019 - Mildly heterogeneous and enlarged thyroid gland without discrete thyroid nodule.  Colonoscopy: age 83 EGD: -  Last Dental Exam: Dr. Loletha Carrow in [place], 10/2020 Last Eye Exam: wears glasses, Dr. Darliss Ridgel, 2021, glasses Last Derm Exam: Dr. Denna Haggard, 2021, full body check   Patient Care Team: Unk Pinto, MD as PCP - General (Internal Medicine) Lavonna Monarch, MD (Inactive) as  Consulting Physician (Dermatology)  Surgical History:  She has a past surgical history that includes Cholecystectomy, laparoscopic (2004); Tonsillectomy and adenoidectomy (2002); Colposcopy (2002); and Cesarean section (N/A, 06/21/2016). Family History:  Herfamily history includes ALS in her maternal grandmother; ALS (age of onset: 63) in her maternal uncle; Alzheimer's disease in her maternal grandfather; Breast cancer (age of onset: 58) in her maternal grandmother; Depression in her mother; Leukemia (age of onset: 54) in her paternal aunt; Lung cancer in her paternal grandfather; Prostate cancer in her maternal grandfather; Suicidality in her mother; Thyroid cancer in her maternal aunt. Social History:  She reports that she quit smoking about 19 years ago. Her smoking use included cigarettes. She has a 0.60 pack-year smoking history. She has never used smokeless tobacco. She reports that she does not drink alcohol and  does not use drugs.   Review of Systems: Review of Systems  Constitutional:  Negative for malaise/fatigue and weight loss.  HENT:  Negative for hearing loss and tinnitus.   Eyes:  Negative for blurred vision and double vision.  Respiratory:  Negative for cough, shortness of breath and wheezing.   Cardiovascular:  Negative for chest pain, palpitations, orthopnea, claudication and leg swelling.  Gastrointestinal:  Negative for abdominal pain, blood in stool, constipation, diarrhea, heartburn, melena, nausea and vomiting.  Genitourinary: Negative.   Musculoskeletal:  Negative for joint pain and myalgias.  Skin:  Negative for rash.  Neurological:  Positive for headaches (intermittent, tension). Negative for dizziness, sensory change and weakness.  Endo/Heme/Allergies:  Negative for polydipsia.  Psychiatric/Behavioral:  Negative for depression, substance abuse and suicidal ideas. The patient is not nervous/anxious and does not have insomnia.   All other systems reviewed and are negative.   Physical Exam: Estimated body mass index is 32.19 kg/m as calculated from the following:   Height as of this encounter: _0  (1.575 m).   Weight as of this encounter: 176 lb (79.8 kg). BP 100/70   Pulse 86   Temp 97.7 F (36.5 C)   Resp 16   Ht _1  (1.575 m)   Wt 176 lb (79.8 kg)   SpO2 99%   BMI 32.19 kg/m    General Appearance: Well nourished, in no apparent distress.  Eyes: PERRLA, EOMs, conjunctiva no swelling or erythema Sinuses: No Frontal/maxillary tenderness  ENT/Mouth: Ext aud canals clear, normal light reflex with TMs without erythema, bulging. Good dentition. No erythema, swelling, or exudate on post pharynx. Tonsils not swollen or erythematous. Hearing normal.  Neck: Supple, thyroid questionably mildly enlarged without palpable nodules. No bruits  Respiratory: Respiratory effort normal, BS equal bilaterally without rales, rhonchi, wheezing or stridor.  Cardio: RRR without  murmurs, rubs or gallops. Brisk peripheral pulses without edema.  Chest: symmetric, with normal excursions and percussion.  Breasts: defer to GYN  Abdomen: Soft, nontender, no guarding, rebound, hernias, masses, or organomegaly.  Lymphatics: Non tender without lymphadenopathy.  Genitourinary: Defer to GYN Musculoskeletal: Full ROM all peripheral extremities,5/5 strength, and normal gait.  Skin: Warm, dry without rashes, lesions, ecchymosis. Neuro: Cranial nerves intact, reflexes equal bilaterally. Normal muscle tone, no cerebellar symptoms. Sensation intact.  Psych: Awake and oriented X 3, normal affect, Insight and Judgment appropriate.   EKG: NSR, no ST changes  Antonie Borjon Mikki Santee, NP 10:26 AM [facility]

## 2022-10-25 ENCOUNTER — Ambulatory Visit (INDEPENDENT_AMBULATORY_CARE_PROVIDER_SITE_OTHER): Payer: BC Managed Care – PPO | Admitting: Nurse Practitioner

## 2022-10-25 ENCOUNTER — Encounter: Payer: Self-pay | Admitting: Nurse Practitioner

## 2022-10-25 VITALS — BP 100/70 | HR 86 | Temp 97.7°F | Resp 16 | Ht 62.0 in | Wt 176.0 lb

## 2022-10-25 DIAGNOSIS — Z0000 Encounter for general adult medical examination without abnormal findings: Secondary | ICD-10-CM | POA: Diagnosis not present

## 2022-10-25 DIAGNOSIS — R7309 Other abnormal glucose: Secondary | ICD-10-CM

## 2022-10-25 DIAGNOSIS — Z136 Encounter for screening for cardiovascular disorders: Secondary | ICD-10-CM

## 2022-10-25 DIAGNOSIS — Z0001 Encounter for general adult medical examination with abnormal findings: Secondary | ICD-10-CM

## 2022-10-25 DIAGNOSIS — Z131 Encounter for screening for diabetes mellitus: Secondary | ICD-10-CM

## 2022-10-25 DIAGNOSIS — Z1389 Encounter for screening for other disorder: Secondary | ICD-10-CM

## 2022-10-25 DIAGNOSIS — E559 Vitamin D deficiency, unspecified: Secondary | ICD-10-CM | POA: Diagnosis not present

## 2022-10-25 DIAGNOSIS — Z79899 Other long term (current) drug therapy: Secondary | ICD-10-CM

## 2022-10-25 DIAGNOSIS — I10 Essential (primary) hypertension: Secondary | ICD-10-CM

## 2022-10-25 DIAGNOSIS — B009 Herpesviral infection, unspecified: Secondary | ICD-10-CM

## 2022-10-25 DIAGNOSIS — E063 Autoimmune thyroiditis: Secondary | ICD-10-CM

## 2022-10-25 DIAGNOSIS — F439 Reaction to severe stress, unspecified: Secondary | ICD-10-CM

## 2022-10-25 DIAGNOSIS — Z13220 Encounter for screening for lipoid disorders: Secondary | ICD-10-CM | POA: Diagnosis not present

## 2022-10-25 DIAGNOSIS — E669 Obesity, unspecified: Secondary | ICD-10-CM

## 2022-10-25 NOTE — Patient Instructions (Signed)

## 2022-10-27 LAB — CBC WITH DIFFERENTIAL/PLATELET
Absolute Monocytes: 549 cells/uL (ref 200–950)
Basophils Absolute: 47 cells/uL (ref 0–200)
Basophils Relative: 0.8 %
Eosinophils Absolute: 260 cells/uL (ref 15–500)
Eosinophils Relative: 4.4 %
HCT: 42.9 % (ref 35.0–45.0)
Hemoglobin: 14.5 g/dL (ref 11.7–15.5)
Lymphs Abs: 2071 cells/uL (ref 850–3900)
MCH: 31 pg (ref 27.0–33.0)
MCHC: 33.8 g/dL (ref 32.0–36.0)
MCV: 91.7 fL (ref 80.0–100.0)
MPV: 9.9 fL (ref 7.5–12.5)
Monocytes Relative: 9.3 %
Neutro Abs: 2974 cells/uL (ref 1500–7800)
Neutrophils Relative %: 50.4 %
Platelets: 361 10*3/uL (ref 140–400)
RBC: 4.68 10*6/uL (ref 3.80–5.10)
RDW: 12.1 % (ref 11.0–15.0)
Total Lymphocyte: 35.1 %
WBC: 5.9 10*3/uL (ref 3.8–10.8)

## 2022-10-27 LAB — URINALYSIS W MICROSCOPIC + REFLEX CULTURE
Bacteria, UA: NONE SEEN /HPF
Bilirubin Urine: NEGATIVE
Glucose, UA: NEGATIVE
Hgb urine dipstick: NEGATIVE
Hyaline Cast: NONE SEEN /LPF
Ketones, ur: NEGATIVE
Nitrites, Initial: NEGATIVE
Protein, ur: NEGATIVE
RBC / HPF: NONE SEEN /HPF (ref 0–2)
Specific Gravity, Urine: 1.025 (ref 1.001–1.035)
WBC, UA: NONE SEEN /HPF (ref 0–5)
pH: 8 (ref 5.0–8.0)

## 2022-10-27 LAB — LIPID PANEL
Cholesterol: 143 mg/dL (ref <200)
HDL: 55 mg/dL (ref >=50)
LDL Cholesterol (Calc): 70 mg/dL (calc)
Non-HDL Cholesterol (Calc): 88 mg/dL (calc) (ref <130)
Total CHOL/HDL Ratio: 2.6 (calc) (ref <5.0)
Triglycerides: 99 mg/dL (ref <150)

## 2022-10-27 LAB — HEMOGLOBIN A1C
Hgb A1c MFr Bld: 5.2 % of total Hgb (ref <5.7)
Mean Plasma Glucose: 103 mg/dL
eAG (mmol/L): 5.7 mmol/L

## 2022-10-27 LAB — CULTURE INDICATED

## 2022-10-27 LAB — COMPLETE METABOLIC PANEL WITH GFR
AG Ratio: 1.4 (calc) (ref 1.0–2.5)
ALT: 12 U/L (ref 6–29)
AST: 13 U/L (ref 10–30)
Albumin: 4.3 g/dL (ref 3.6–5.1)
Alkaline phosphatase (APISO): 63 U/L (ref 31–125)
BUN: 11 mg/dL (ref 7–25)
CO2: 27 mmol/L (ref 20–32)
Calcium: 9.7 mg/dL (ref 8.6–10.2)
Chloride: 105 mmol/L (ref 98–110)
Creat: 0.84 mg/dL (ref 0.50–0.99)
Globulin: 3 g/dL (calc) (ref 1.9–3.7)
Glucose, Bld: 70 mg/dL (ref 65–99)
Potassium: 4.1 mmol/L (ref 3.5–5.3)
Sodium: 140 mmol/L (ref 135–146)
Total Bilirubin: 0.6 mg/dL (ref 0.2–1.2)
Total Protein: 7.3 g/dL (ref 6.1–8.1)
eGFR: 88 mL/min/{1.73_m2} (ref >=60)

## 2022-10-27 LAB — URINE CULTURE
MICRO NUMBER:: 14293884
Result:: NO GROWTH
SPECIMEN QUALITY:: ADEQUATE

## 2022-10-27 LAB — MAGNESIUM: Magnesium: 2.2 mg/dL (ref 1.5–2.5)

## 2022-10-27 LAB — VITAMIN D 25 HYDROXY (VIT D DEFICIENCY, FRACTURES): Vit D, 25-Hydroxy: 37 ng/mL (ref 30–100)

## 2023-01-01 ENCOUNTER — Encounter: Payer: Self-pay | Admitting: Nurse Practitioner

## 2023-01-01 NOTE — Telephone Encounter (Signed)
Her insurance card for PA

## 2023-01-23 ENCOUNTER — Telehealth: Payer: Self-pay

## 2023-01-23 ENCOUNTER — Ambulatory Visit: Payer: 59 | Admitting: Internal Medicine

## 2023-01-23 NOTE — Telephone Encounter (Signed)
New insurance doesn't cover [phone].  Sent patient a Therapist, music.

## 2023-01-23 NOTE — Telephone Encounter (Signed)
Pt is wanting to know the status on a PA for wegovy?

## 2023-02-07 ENCOUNTER — Encounter: Payer: Self-pay | Admitting: Internal Medicine

## 2023-02-07 ENCOUNTER — Ambulatory Visit (INDEPENDENT_AMBULATORY_CARE_PROVIDER_SITE_OTHER): Payer: BC Managed Care – PPO | Admitting: Internal Medicine

## 2023-02-07 VITALS — BP 130/88 | HR 95 | Ht 62.0 in | Wt 177.8 lb

## 2023-02-07 DIAGNOSIS — E063 Autoimmune thyroiditis: Secondary | ICD-10-CM

## 2023-02-07 DIAGNOSIS — E010 Iodine-deficiency related diffuse (endemic) goiter: Secondary | ICD-10-CM

## 2023-02-07 NOTE — Patient Instructions (Signed)
Please stop at the lab.  Please return for 1 year.

## 2023-02-07 NOTE — Progress Notes (Unsigned)
Patient ID: [name] [name], female   DOB: 1981/09/02, 42 y.o.   MRN: [id number]   HPI  [name] [name] is a 42 y.o.-year-old female, initially referred by her PCP, Unk Pinto, MD, returning for follow-up for Hashimoto's thyroiditis and goiter.  Last visit 1 year ago.  Interim history: In 09-08/2020 >> started [facility] 08/2020.  She lost a total of 40 lbs since started.  However, since last visit, she gained approximately 14 pounds. She will likely need to come come off 2/2 price. She feels well at today's visit, only with occasional fatigue. She is off Selenium for 4 mo. she ran out and did not refill it. Approximately 4 months ago, she moved her job from [employer] to [employer].  She likes this much better.  Reviewed history: She was diagnosed with Hashimoto's thyroiditis in 2017.  She did not require thyroid hormones yet.  We started selenium 200 mcg daily in 05/2019.  She did not feel different on this, but her thyroid antibodies decreased.  Reviewed her TFTs: Lab Results  Component Value Date   TSH 1.36 07/10/2022   TSH 1.41 01/22/2022   TSH 1.03 07/31/2021   TSH 1.20 01/16/2021   TSH 1.32 06/06/2020   TSH 1.31 12/07/2019   TSH 1.31 05/24/2019   TSH 1.43 02/04/2019   TSH 1.53 08/05/2018   TSH 0.87 11/05/2016   FREET4 0.89 01/22/2022   FREET4 0.97 07/31/2021   FREET4 0.89 01/16/2021   FREET4 0.89 06/06/2020   FREET4 0.84 12/07/2019   FREET4 0.69 05/24/2019    TPO antibodies were elevated during her pregnancy and they remained elevated after giving birth: Component     Latest Ref Rng & Units 11/05/2016  Thyroglobulin Ab     <2 IU/mL <1  Thyroperoxidase Ab SerPl-aCnc     <9 IU/mL 139 (H)   Component     Latest Ref Rng & Units 05/24/2019  Thyroperoxidase Ab SerPl-aCnc     <9 IU/mL 42 (H)  Thyroglobulin Ab     < or = 1 IU/mL 1   Component     Latest Ref Rng & Units 12/07/2019  Thyroglobulin Ab     < or = 1 IU/mL <1  Thyroperoxidase Ab SerPl-aCnc     <9  IU/mL 36 (H)   Component     Latest Ref Rng & Units 01/16/2021  Thyroperoxidase Ab SerPl-aCnc     <9 IU/mL 24 (H)   Component     Latest Ref Rng & Units 01/22/2022  Thyroperoxidase Ab SerPl-aCnc     <9 IU/mL 56 (H)  Thyroglobulin Ab     < or = 1 IU/mL <1   Thyroid ultrasound (04/19/2019): Mildly heterogeneous and enlarged thyroid gland, without nodules Parenchymal Echotexture: Mildly heterogenous Isthmus: 0.4 cm Right lobe: 4.6 x 1.7 x 1.6 cm Left lobe: 4.7 x 1.4 x 1.3 cm  She describes a long history of colon - weight gain - gained 20 lbs when moved to [place] for 1 year, then moved back in 06/2018 lost 10 lbs. Lowest weight 160 lb. -Fatigue -Cold intolerance -Depression/anxiety- on Lexapro.  -Constipation- occasional -Hair loss- occasional. Hair loss increased after Covid19 in 10/2020.  Pt denies: - feeling nodules in neck - hoarseness - dysphagia - choking  She has no family history of thyroid disease but FH of thyroid cancer + M aunt  - had hemithyroidectomy.  She has a family history of autoimmune disease in mother (psoriasis).   No recent steroid use.. No history of radiation therapy to head  or neck. No iodine supplements or biotin.  Pt. also has a history of cholecystectomy, C-section with her son in 2017.  Therapist: Dr. Ardath Sax.  ROS: + see HPI I reviewed pt's medications, allergies, PMH, social hx, family hx, and changes were documented in the history of present illness. Otherwise, unchanged from my initial visit note.  Past Medical History:  Diagnosis Date   Abnormal Pap smear 2002   Colpo and Laser HPV   Former smoker    Headache    migraines   HSV-1 (herpes simplex virus 1) infection 4/11   positive on peri-anal c & s   Hx of migraines 03/07/2016   Obesity    Vaginal Pap smear, abnormal    Past Surgical History:  Procedure Laterality Date   CESAREAN SECTION N/A 06/21/2016   Procedure: CESAREAN SECTION;  Surgeon: Crawford Givens, MD;  Location: [facility];  Service: Obstetrics;  Laterality: N/A;   CHOLECYSTECTOMY, LAPAROSCOPIC  2004   COLPOSCOPY  2002   HPV, laser   TONSILLECTOMY AND ADENOIDECTOMY  2002   Social History   Socioeconomic History   Marital status: Single    Spouse name: Not on file   Number of children: 1   Years of education: Not on file   Highest education level: Not on file  Occupational History    Employer: [employer]; pharmacist  Tobacco Use   Smoking status: Former Smoker    Quit date: 06/20/2003    Years since quitting: 15.9   Smokeless tobacco: Never Used   Tobacco comment: in college  Substance and Sexual Activity   Alcohol use: No   Drug use: No   Sexual activity: Not Currently    Birth control/protection: None  Lifestyle   Physical activity    Days per week: 2 days    Minutes per session: 30 min   Stress: Only a little   Current Outpatient Medications on File Prior to Visit  Medication Sig Dispense Refill   ascorbic acid (VITAMIN C) 500 MG tablet Take 500 mg by mouth daily.     buPROPion (WELLBUTRIN XL) 150 MG 24 hr tablet Take 1 tablet (150 mg total) by mouth every morning. 30 tablet 2   Cholecalciferol (VITAMIN D) 50 MCG (2000 UT) tablet Take 2,000 Units by mouth daily.     Cyanocobalamin (B-12 PO) Take by mouth.     Drospirenone (SLYND) 4 MG TABS Take by mouth daily.     meloxicam (MOBIC) 15 MG tablet Take 1/2 to 1 tablet Daily with Food for Pain & Inflammation (Patient taking differently: as needed. Take 1/2 to 1 tablet Daily with Food for Pain & Inflammation) 90 tablet 1   montelukast (SINGULAIR) 10 MG tablet TAKE 1 TABLET BY MOUTH DAILY FOR ALLERGIES 90 tablet 3   selenium 50 MCG TABS tablet Take 200 mcg by mouth daily.      Semaglutide-Weight Management (WEGOVY) 2.4 MG/0.75ML SOAJ Inject 2.4 mg into the skin once a week. 9 mL 2   topiramate (TOPAMAX) 25 MG tablet TAKE 1 TABLET BY MOUTH AT DINNER AND 2 TABLETS AT BEDTIME FOR WEIGHT 270 tablet 1   tretinoin (RETIN-A) 0.05 %  cream Apply topically at bedtime. 45 g 0   No current facility-administered medications on file prior to visit.   Allergies  Allergen Reactions   Other Other (See Comments)    steroid eye drops-causes glaucoma    Family History  Problem Relation Age of Onset   Depression Mother  Suicidality Mother    Lung cancer Paternal Grandfather    ALS Maternal Grandmother    Breast cancer Maternal Grandmother 81   Alzheimer's disease Maternal Grandfather    Prostate cancer Maternal Grandfather    Thyroid cancer Maternal Aunt    ALS Maternal Uncle 79   Leukemia Paternal Aunt 62       CLL   PE: BP 130/88 (BP Location: Right Arm, Patient Position: Sitting, Cuff Size: Normal)   Pulse 95   Ht 5\' 2"  (1.575 m)   Wt 177 lb 12.8 oz (80.6 kg)   SpO2 97%   BMI 32.52 kg/m  Wt Readings from Last 3 Encounters:  02/07/23 177 lb 12.8 oz (80.6 kg)  10/25/22 176 lb (79.8 kg)  07/10/22 175 lb 12.8 oz (79.7 kg)   Constitutional: overweight, in NAD Eyes:  EOMI, no exophthalmos ENT: no neck masses, no cervical lymphadenopathy Cardiovascular: RRR, No MRG Respiratory: CTA B Musculoskeletal: no deformities Skin:no rashes Neurological: no tremor with outstretched hands  ASSESSMENT: 1. Hashimoto thyroiditis  2. Thyromegaly  PLAN: 1. Hashimoto thyroiditis -Patient has a history of elevated thyroid antibodies and also a heterogeneous aspect of her thyroid on ultrasound.  She does not have a personal history of radiation therapy to head or neck.  She does have an aunt with thyroid cancer (unclear type). -her TFTs have been normal so far so she did not require levothyroxine -Her antithyroid antibodies were elevated and we started selenium in 05/2019.  Her antibody level improved, but remained elevated she continues on selenium.  We discussed in the past that if her antibodies normalized, to continue to try without selenium but at last visit we continued it since antibodies were still elevated.  At this  visit, she tells me that she ran out approximately 4 months ago and did not refill it. -We discussed in the past about other measures to improve her immune system (stress reduction; relaxation; diet-discussed reduction of dairy, gluten, meat, and increasing fruits and vegetables; exercise; sleep) -She does not report hyperthyroid symptoms today; she feels some fatigue, but this is not necessarily new. -We will recheck her TFTs today and also add antibody levels  -If the antibodies have increased, we may need to restart selenium.  She agrees with this. -She is aware about how to take levothyroxine correctly in case we need to start: every day, with water, at least 30 minutes before breakfast, separated by at least 4 hours from: - acid reflux medications - calcium - iron - multivitamins - RTC in 1 year  2.  Thyromegaly -Her thyroid gland appeared slightly enlarged on the most recent thyroid ultrasound from 04/2019), but without nodules.  I do not feel a goiter on physical exam -No compression symptoms.  She does have occasional dysphagia and sore throat (postnasal drip and reflux). -We discussed about thyroid enlargement which can be related to Hashimoto's thyroiditis and has a fluctuating pattern -No further investigation is needed for this  Philemon Kingdom, MD PhD [facility]

## 2023-02-09 LAB — T4, FREE: Free T4: 1.47 ng/dL (ref 0.82–1.77)

## 2023-02-09 LAB — TSH: TSH: 1.35 u[IU]/mL (ref 0.450–4.500)

## 2023-02-09 LAB — T3, FREE: T3, Free: 3.2 pg/mL (ref 2.0–4.4)

## 2023-02-09 LAB — THYROGLOBULIN ANTIBODY: Thyroglobulin Antibody: <1 IU/mL (ref 0.0–0.9)

## 2023-02-09 LAB — THYROID PEROXIDASE ANTIBODY: Thyroperoxidase Ab SerPl-aCnc: 205 IU/mL — ABNORMAL HIGH (ref 0–34)

## 2023-02-10 ENCOUNTER — Encounter: Payer: Self-pay | Admitting: Internal Medicine

## 2023-03-21 ENCOUNTER — Other Ambulatory Visit: Payer: Self-pay | Admitting: Internal Medicine

## 2023-03-21 ENCOUNTER — Encounter: Payer: Self-pay | Admitting: Internal Medicine

## 2023-03-21 DIAGNOSIS — E063 Autoimmune thyroiditis: Secondary | ICD-10-CM

## 2023-03-21 NOTE — Telephone Encounter (Signed)
Please Advise,

## 2023-04-30 ENCOUNTER — Encounter: Payer: Self-pay | Admitting: Nurse Practitioner

## 2023-04-30 ENCOUNTER — Ambulatory Visit (INDEPENDENT_AMBULATORY_CARE_PROVIDER_SITE_OTHER): Payer: BC Managed Care – PPO | Admitting: Nurse Practitioner

## 2023-04-30 VITALS — BP 126/74 | HR 77 | Temp 97.6°F | Ht 62.0 in | Wt 178.6 lb

## 2023-04-30 DIAGNOSIS — Z8669 Personal history of other diseases of the nervous system and sense organs: Secondary | ICD-10-CM | POA: Diagnosis not present

## 2023-04-30 DIAGNOSIS — R42 Dizziness and giddiness: Secondary | ICD-10-CM

## 2023-04-30 DIAGNOSIS — R110 Nausea: Secondary | ICD-10-CM

## 2023-04-30 DIAGNOSIS — R519 Headache, unspecified: Secondary | ICD-10-CM | POA: Diagnosis not present

## 2023-04-30 NOTE — Progress Notes (Signed)
Assessment and Plan:  [name] [name] was seen today for an episodic visit.  Diagnoses and all order for this visit:  Dizziness Refer to PT for Epley Maneuver related to possible Vertiog Discussed Head CT to assess for any underlying contributory etiologies given possible postictal stage of any seizure like activity.   Start Meclizine as directed Report to ER for any increase in stroke like symptoms, including HA, N/V, paralysis, difficulty speaking, trouble walking, confusion, vision changes, CP, heart palpitations, SOB, diaphoresis or further seizure like activity.  - Ambulatory referral to Physical Therapy - CT HEAD WO CONTRAST ( ); Future  Nausea Start Meclizine as directed.  - Ambulatory referral to Physical Therapy - CT HEAD WO CONTRAST ( ); Future  Mild headache/Hx of migraine Stay well hydrated  OTC Tylenol   - CT HEAD WO CONTRAST ( ); Future  Notify office for further evaluation and treatment, questions or concerns if s/s fail to improve. The risks and benefits of my recommendations, as well as other treatment options were discussed with the patient today. Questions were answered.  Further disposition pending results of labs. Discussed med's effects and SE's.    Over 20 minutes of exam, counseling, chart review, and critical decision making was performed.   Future Appointments  Date Time Provider Department Center  10/28/2023 10:00 AM Raynelle Dick, NP [facility] None    ------------------------------------------------------------------------------------------------------------------   HPI BP 126/74   Pulse 77   Temp 97.6 F (36.4 C)   Ht 5\' 2"  (1.575 m)   Wt 178 lb 9.6 oz (81 kg)   SpO2 99%   BMI 32.67 kg/m   42 y.o.female presents for evaluation of x2 episodes of dizziness accompanied by nausea and mild headache. 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 She had associated tingling in the head.  Reports afterwards she continued to feel swimmy headed and developed a mild HA with nausea.  She thought that she was going to experience a migraine, which she has suffered from in the past, but this has subsided since having her son 6 years ago.  She did say that she felt as though she turned fast and contributed it to vertigo, however, the second time it happened was a day later when she was sitting on her couch, not moving.  She had the same feeling come over her and did not feel back to baseline until the next day.  She reports that the episodes after the dizziness, nausea dn HA subside and she feels normal again until she experiences the wave like sensation.  States that after she had the first episode she went home and checked her BG and BP which were normal.  She has had an updated vision study, wears glasses.  She has not started any new medications or supplements.  She feels as though these episodes come out of nowhere.  She does not have a hx of seizures. Denies an aura.  No past TBI, head injury, MVA, or sports related injury.  Denies any recent URI, seasonal allergy symptoms.  Currently well controlled with Montelukast.   Past Medical History:  Diagnosis Date   Abnormal Pap smear 2002   Colpo and Laser HPV   Former smoker    Headache    migraines   HSV-1 (herpes simplex virus 1) infection 4/11   positive on peri-anal c & s  Hx of migraines 03/07/2016   Obesity    Vaginal Pap smear, abnormal      Allergies  Allergen Reactions   Other Other (See Comments)    steroid eye drops-causes glaucoma     Current Outpatient Medications on File Prior to Visit  Medication Sig   Cholecalciferol (VITAMIN D) 50 MCG (2000 UT) tablet Take 2,000 Units by mouth daily.   Drospirenone (SLYND) 4 MG TABS Take by mouth daily.   montelukast (SINGULAIR) 10 MG tablet TAKE 1 TABLET BY MOUTH DAILY FOR ALLERGIES   selenium 50 MCG TABS  tablet Take 200 mcg by mouth daily.    Semaglutide-Weight Management (WEGOVY) 2.4 MG/0.75ML SOAJ Inject 2.4 mg into the skin once a week.   tretinoin (RETIN-A) 0.05 % cream Apply topically at bedtime.   Cyanocobalamin (B-12 PO) Take by mouth. (Patient not taking: Reported on 04/30/2023)   No current facility-administered medications on file prior to visit.    ROS: all negative except what is noted in the HPI.   Physical Exam:  BP 126/74   Pulse 77   Temp 97.6 F (36.4 C)   Ht 5\' 2"  (1.575 m)   Wt 178 lb 9.6 oz (81 kg)   SpO2 99%   BMI 32.67 kg/m   General Appearance: NAD.  Awake, conversant and cooperative. Eyes: PERRLA, EOMs intact.  Sclera white.  Conjunctiva without erythema. Sinuses: No frontal/maxillary tenderness.  No nasal discharge. Nares patent.  ENT/Mouth: Ext aud canals clear.  Bilateral TMs w/DOL and without erythema or bulging. Hearing intact.  Posterior pharynx without swelling or exudate.  Tonsils without swelling or erythema.  Neck: Supple.  No masses, nodules or thyromegaly. Respiratory: Effort is regular with non-labored breathing. Breath sounds are equal bilaterally without rales, rhonchi, wheezing or stridor.  Cardio: RRR with no MRGs. Brisk peripheral pulses without edema.  Abdomen: Active BS in all four quadrants.  Soft and non-tender without guarding, rebound tenderness, hernias or masses. Lymphatics: Non tender without lymphadenopathy.  Musculoskeletal: Full ROM, 5/5 strength, normal ambulation.  No clubbing or cyanosis. Skin: Appropriate color for ethnicity. Warm without rashes, lesions, ecchymosis, ulcers.  Neuro: CN II-XII grossly normal. Normal muscle tone without cerebellar symptoms and intact sensation.   Psych: AO X 3,  appropriate mood and affect, insight and judgment.     Adela Glimpse, NP 3:51 PM [facility]

## 2023-04-30 NOTE — Patient Instructions (Signed)
Dizziness Dizziness is a common problem. It makes you feel unsteady or light-headed. You may feel like you are about to pass out (faint). Dizziness can lead to getting hurt if you stumble or fall. Dizziness can be caused by many things, including: Medicines. Not having enough water in your body (dehydration). Illness. Follow these instructions at home: Eating and drinking  Drink enough fluid to keep your pee (urine) pale yellow. This helps to keep you from getting dehydrated. Try to drink more clear fluids, such as water. Do not drink alcohol. Limit how much caffeine you drink or eat, if your doctor tells you to do that. Limit how much salt (sodium) you drink or eat, if your doctor tells you to do that. Activity  Avoid making quick movements. Stand up slowly from sitting in a chair, and steady yourself until you feel okay. In the morning, first sit up on the side of the bed. When you feel okay, stand up slowly while you hold onto something. Do this until you know that your balance is okay. If you need to stand in one place for a long time, move your legs often. Tighten and relax the muscles in your legs while you are standing. Do not drive or use machinery if you feel dizzy. Avoid bending down if you feel dizzy. Place items in your home so you can reach them easily without leaning over. Lifestyle Do not smoke or use any products that contain nicotine or tobacco. If you need help quitting, ask your doctor. Try to lower your stress level. You can do this by using methods such as yoga or meditation. Talk with your doctor if you need help. General instructions Watch your dizziness for any changes. Take over-the-counter and prescription medicines only as told by your doctor. Talk with your doctor if you think that you are dizzy because of a medicine that you are taking. Tell a friend or a family member that you are feeling dizzy. If he or she notices any changes in your behavior, have this  person call your doctor. Keep all follow-up visits. Contact a doctor if: Your dizziness does not go away. Your dizziness or light-headedness gets worse. You feel like you may vomit (are nauseous). You have trouble hearing. You have new symptoms. You are unsteady on your feet. You feel like the room is spinning. You have neck pain or a stiff neck. You have a fever. Get help right away if: You vomit or have watery poop (diarrhea), and you cannot eat or drink anything. You have trouble: Talking. Walking. Swallowing. Using your arms, hands, or legs. You feel generally weak. You are not thinking clearly, or you have trouble forming sentences. A friend or family member may notice this. You have: Chest pain. Pain in your belly (abdomen). Shortness of breath. Sweating. Your vision changes. You are bleeding. You have a very bad headache. These symptoms may be an emergency. Get help right away. Call your local emergency services (911 in the [place] Do not wait to see if the symptoms will go away. Do not drive yourself to the hospital. Summary Dizziness makes you feel unsteady or light-headed. You may feel like you are about to pass out (faint). Drink enough fluid to keep your pee (urine) pale yellow. Do not drink alcohol. Avoid making quick movements if you feel dizzy. Watch your dizziness for any changes. This information is not intended to replace advice given to you by your health care provider. Make sure you discuss any questions   you have with your health care provider. Document Revised: 10/06/2020 Document Reviewed: 10/09/2020 Elsevier Patient Education  2024 [employer]  

## 2023-05-19 ENCOUNTER — Encounter: Payer: Self-pay | Admitting: Nurse Practitioner

## 2023-05-30 ENCOUNTER — Other Ambulatory Visit: Payer: Self-pay | Admitting: Nurse Practitioner

## 2023-05-30 DIAGNOSIS — L709 Acne, unspecified: Secondary | ICD-10-CM

## 2023-06-02 ENCOUNTER — Ambulatory Visit
Admission: RE | Admit: 2023-06-02 | Discharge: 2023-06-02 | Disposition: A | Discharge status: Home / Self Care | Payer: BC Managed Care – PPO | Source: Ambulatory Visit | Attending: Nurse Practitioner | Admitting: Nurse Practitioner

## 2023-06-02 DIAGNOSIS — R42 Dizziness and giddiness: Secondary | ICD-10-CM | POA: Diagnosis not present

## 2023-06-02 DIAGNOSIS — R519 Headache, unspecified: Secondary | ICD-10-CM

## 2023-06-02 DIAGNOSIS — R110 Nausea: Secondary | ICD-10-CM

## 2023-07-18 DIAGNOSIS — Z01419 Encounter for gynecological examination (general) (routine) without abnormal findings: Secondary | ICD-10-CM | POA: Diagnosis not present

## 2023-07-18 DIAGNOSIS — Z1231 Encounter for screening mammogram for malignant neoplasm of breast: Secondary | ICD-10-CM | POA: Diagnosis not present

## 2023-07-18 DIAGNOSIS — Z8742 Personal history of other diseases of the female genital tract: Secondary | ICD-10-CM | POA: Diagnosis not present

## 2023-07-31 ENCOUNTER — Other Ambulatory Visit: Payer: Self-pay | Admitting: Obstetrics and Gynecology

## 2023-07-31 DIAGNOSIS — R928 Other abnormal and inconclusive findings on diagnostic imaging of breast: Secondary | ICD-10-CM

## 2023-08-05 ENCOUNTER — Encounter: Payer: Self-pay | Admitting: Obstetrics and Gynecology

## 2023-08-12 ENCOUNTER — Ambulatory Visit
Admission: RE | Admit: 2023-08-12 | Discharge: 2023-08-12 | Disposition: A | Discharge status: Home / Self Care | Payer: BC Managed Care – PPO | Source: Ambulatory Visit | Attending: Obstetrics and Gynecology | Admitting: Obstetrics and Gynecology

## 2023-08-12 ENCOUNTER — Other Ambulatory Visit: Payer: Self-pay | Admitting: Obstetrics and Gynecology

## 2023-08-12 DIAGNOSIS — R928 Other abnormal and inconclusive findings on diagnostic imaging of breast: Secondary | ICD-10-CM

## 2023-08-12 DIAGNOSIS — N6341 Unspecified lump in right breast, subareolar: Secondary | ICD-10-CM | POA: Diagnosis not present

## 2023-08-12 DIAGNOSIS — N6310 Unspecified lump in the right breast, unspecified quadrant: Secondary | ICD-10-CM

## 2023-08-13 DIAGNOSIS — M9901 Segmental and somatic dysfunction of cervical region: Secondary | ICD-10-CM | POA: Diagnosis not present

## 2023-08-13 DIAGNOSIS — M62838 Other muscle spasm: Secondary | ICD-10-CM | POA: Diagnosis not present

## 2023-08-13 DIAGNOSIS — M542 Cervicalgia: Secondary | ICD-10-CM | POA: Diagnosis not present

## 2023-08-13 DIAGNOSIS — M9902 Segmental and somatic dysfunction of thoracic region: Secondary | ICD-10-CM | POA: Diagnosis not present

## 2023-08-15 ENCOUNTER — Other Ambulatory Visit (INDEPENDENT_AMBULATORY_CARE_PROVIDER_SITE_OTHER): Payer: BC Managed Care – PPO

## 2023-08-15 DIAGNOSIS — E063 Autoimmune thyroiditis: Secondary | ICD-10-CM | POA: Diagnosis not present

## 2023-08-15 LAB — T3, FREE: T3, Free: 3.1 pg/mL (ref 2.3–4.2)

## 2023-08-15 LAB — T4, FREE: Free T4: 0.98 ng/dL (ref 0.60–1.60)

## 2023-08-15 LAB — TSH: TSH: 1.55 u[IU]/mL (ref 0.35–5.50)

## 2023-08-18 LAB — THYROGLOBULIN ANTIBODY: Thyroglobulin Ab: <1 [IU]/mL (ref <=1)

## 2023-08-18 LAB — THYROID PEROXIDASE ANTIBODY: Thyroperoxidase Ab SerPl-aCnc: 80 [IU]/mL — ABNORMAL HIGH (ref <9)

## 2023-08-20 ENCOUNTER — Ambulatory Visit
Admission: RE | Admit: 2023-08-20 | Discharge: 2023-08-20 | Disposition: A | Discharge status: Home / Self Care | Payer: BC Managed Care – PPO | Source: Ambulatory Visit | Attending: Obstetrics and Gynecology | Admitting: Obstetrics and Gynecology

## 2023-08-20 DIAGNOSIS — N6310 Unspecified lump in the right breast, unspecified quadrant: Secondary | ICD-10-CM

## 2023-08-20 DIAGNOSIS — M9901 Segmental and somatic dysfunction of cervical region: Secondary | ICD-10-CM | POA: Diagnosis not present

## 2023-08-20 DIAGNOSIS — M62838 Other muscle spasm: Secondary | ICD-10-CM | POA: Diagnosis not present

## 2023-08-20 DIAGNOSIS — M542 Cervicalgia: Secondary | ICD-10-CM | POA: Diagnosis not present

## 2023-08-20 DIAGNOSIS — M9902 Segmental and somatic dysfunction of thoracic region: Secondary | ICD-10-CM | POA: Diagnosis not present

## 2023-08-20 DIAGNOSIS — R928 Other abnormal and inconclusive findings on diagnostic imaging of breast: Secondary | ICD-10-CM | POA: Diagnosis not present

## 2023-08-20 HISTORY — PX: BREAST BIOPSY: SHX20

## 2023-08-21 LAB — SURGICAL PATHOLOGY

## 2023-10-28 ENCOUNTER — Encounter: Payer: BC Managed Care – PPO | Admitting: Nurse Practitioner

## 2023-11-04 ENCOUNTER — Encounter: Payer: Self-pay | Admitting: Nurse Practitioner

## 2023-11-04 ENCOUNTER — Ambulatory Visit (INDEPENDENT_AMBULATORY_CARE_PROVIDER_SITE_OTHER): Payer: BC Managed Care – PPO | Admitting: Nurse Practitioner

## 2023-11-04 VITALS — BP 118/68 | HR 66 | Temp 97.7°F | Ht 62.0 in | Wt 194.2 lb

## 2023-11-04 DIAGNOSIS — Z131 Encounter for screening for diabetes mellitus: Secondary | ICD-10-CM | POA: Diagnosis not present

## 2023-11-04 DIAGNOSIS — E063 Autoimmune thyroiditis: Secondary | ICD-10-CM

## 2023-11-04 DIAGNOSIS — G5603 Carpal tunnel syndrome, bilateral upper limbs: Secondary | ICD-10-CM

## 2023-11-04 DIAGNOSIS — Z136 Encounter for screening for cardiovascular disorders: Secondary | ICD-10-CM | POA: Diagnosis not present

## 2023-11-04 DIAGNOSIS — Z13220 Encounter for screening for lipoid disorders: Secondary | ICD-10-CM

## 2023-11-04 DIAGNOSIS — Z1389 Encounter for screening for other disorder: Secondary | ICD-10-CM | POA: Diagnosis not present

## 2023-11-04 DIAGNOSIS — F439 Reaction to severe stress, unspecified: Secondary | ICD-10-CM

## 2023-11-04 DIAGNOSIS — E559 Vitamin D deficiency, unspecified: Secondary | ICD-10-CM | POA: Diagnosis not present

## 2023-11-04 DIAGNOSIS — Z124 Encounter for screening for malignant neoplasm of cervix: Secondary | ICD-10-CM

## 2023-11-04 DIAGNOSIS — B009 Herpesviral infection, unspecified: Secondary | ICD-10-CM

## 2023-11-04 DIAGNOSIS — Z0000 Encounter for general adult medical examination without abnormal findings: Secondary | ICD-10-CM

## 2023-11-04 DIAGNOSIS — E669 Obesity, unspecified: Secondary | ICD-10-CM

## 2023-11-04 DIAGNOSIS — I10 Essential (primary) hypertension: Secondary | ICD-10-CM | POA: Diagnosis not present

## 2023-11-04 DIAGNOSIS — G43009 Migraine without aura, not intractable, without status migrainosus: Secondary | ICD-10-CM

## 2023-11-04 DIAGNOSIS — Z0001 Encounter for general adult medical examination with abnormal findings: Secondary | ICD-10-CM

## 2023-11-04 DIAGNOSIS — R7309 Other abnormal glucose: Secondary | ICD-10-CM

## 2023-11-04 DIAGNOSIS — Z79899 Other long term (current) drug therapy: Secondary | ICD-10-CM | POA: Diagnosis not present

## 2023-11-04 MED ORDER — PHENTERMINE HCL 37.5 MG PO TABS: ORAL_TABLET | ORAL | 0 refills | Status: DC

## 2023-11-04 MED ORDER — TOPIRAMATE 50 MG PO TABS
50.0000 mg | ORAL_TABLET | Freq: Two times a day (BID) | ORAL | 2 refills | Status: DC
Start: 2023-11-04 — End: 2024-05-13

## 2023-11-04 NOTE — Progress Notes (Signed)
Complete Physical  Assessment and Plan:  Diagnoses and all orders for this visit:  Encounter for Annual Physical Exam with abnormal findings Due annually  Health Maintenance reviewed Healthy lifestyle reviewed and goals set  Obesity 35 Long discussion about weight loss, diet, and exercise Recommended diet heavy in fruits and veggies and low in animal meats, cheeses, and dairy products, appropriate calorie intake Restart Topamax 50 mg every day and Phentermine 37.5 mg QD High fiber diet, weight bearing resistance exercises encouraged  Hashimoto's thyroiditis Monitor closely; following with Dr. Elvera Lennox as well per patient preference will be seeing annually Thyroid US showed heterogenous enlargement   Situational stress Currently off medications.  If mood does not improve with use of Phentermine and Topamax for weight loss we will plan to restart Wellbutrin Followed by Dr. Cyndia Skeeters Stress management techniques discussed, increase water, good sleep hygiene discussed, increase exercise, and increase veggies.   History of HSV-1 infection Antivirals PRN; use barrier with new partners  Vitamin D deficiency Continue Vit D supplementation to maintain value in therapeutic level of 60-100  - Vitamin D  Abnormal Glucose Continue diet and exercise -     Hemoglobin A1c   Bilateral carpal tunnel Wear cock up splints at night If symptoms worsen will refer to neurology  Medication management -     CBC with Differential/Platelet -     COMPLETE METABOLIC PANEL WITH GFR -     Magnesium -     Lipid panel -     TSH -     Hemoglobin A1C w/out eAG -     VITAMIN D 25 Hydroxy (Vit-D Deficiency, Fractures) -     EKG 12-Lead -     Urinalysis, Routine w reflex microscopic -     Microalbumin / creatinine urine ratio  Screening for ischemic heart disease -     EKG 12-Lead  Screening cholesterol level -     Lipid panel  Screening for hematuria or proteinuria -     Urinalysis, Routine w  reflex microscopic -     Microalbumin / creatinine urine ratio      Discussed med's effects and SE's. Screening labs and tests as requested with regular follow-up as recommended. Over 40 minutes of exam, counseling, chart review, and complex, high level critical decision making was performed this visit.   Future Appointments  Date Time Provider Department Center  11/04/2024  3:00 PM Raynelle Dick, NP [facility] None     HPI  42 y.o. female  presents for a complete physical and follow up for has Situational stress; Hashimoto's thyroiditis; History of HSV-1 infection; Vitamin D deficiency; Obesity (BMI 35.0-39.9 without comorbidity); and Thyromegaly on their problem list.   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  She is having a lot of fatigue, body aches. Feels like she is in pain all the time.   She has been having some difficulty swallowing food. No difficulty swallowing pills. No voice changes.   Follows with GYN Dr. Normand Sloop for PAPs (hx of abnormal) and mammo. Last mammogram in 07/18/23 showed an abnormality and had spot compression and biopsy which were negative    she has a diagnosis of situational depression/anxiety.  Is not currently not on Wellbutrin or Lexapro. Uses melatonin occasionally. she is seeing a therapist Dr. Cyndia Skeeters regularly.     BMI is Body mass index is  35.52 kg/m., she is working on diet and exercise, has lost some weight since returning from [place]. Peak weight 207 lb in 01/2020.  She has switched jobs so [employer] is no longer covered. She has some medication left and was doing Wegovy 1.7 every 10 days and then forgot to take medication for a month and took a dose and got very nauseated and threw up. Not the best at eating, works long hours at a pharmacist- can skip meals No alcohol, 8 glasses of water daily Wt  Readings from Last 3 Encounters:  11/04/23 194 lb 3.2 oz (88.1 kg)  04/30/23 178 lb 9.6 oz (81 kg)  02/07/23 177 lb 12.8 oz (80.6 kg)   Today their BP is BP: 118/68  Controlled without medication BP Readings from Last 3 Encounters:  11/04/23 118/68  04/30/23 126/74  02/07/23 130/88  She does workout, trying to be more active and walking with son. She denies chest pain, shortness of breath, dizziness.   Cholesterol in range without medication. The cholesterol last visit was:   Lab Results  Component Value Date   CHOL 143 10/25/2022   HDL 55 10/25/2022   LDLCALC 70 10/25/2022   TRIG 99 10/25/2022   CHOLHDL 2.6 10/25/2022   Last W0J in the office was:  Lab Results  Component Value Date   HGBA1C 5.2 10/25/2022   Last GFR: Lab Results  Component Value Date   EGFR 88 10/25/2022    She has hx of + thyroid antibody/Hashimoto's, had [place] 2020 without nodules, follows with Dr. Elvera Lennox- she takes selenium which will normally control levels. :  Lab Results  Component Value Date   TSH 1.55 08/15/2023   Patient is on Vitamin D supplement, taking 2000 IU daily    Lab Results  Component Value Date   VD25OH 37 10/25/2022       Current Medications:  Current Outpatient Medications on File Prior to Visit  Medication Sig Dispense Refill   Cholecalciferol (VITAMIN D) 50 MCG (2000 UT) tablet Take 5,000 Units by mouth daily.     Drospirenone (SLYND) 4 MG TABS Take by mouth daily.     selenium 50 MCG TABS tablet Take 200 mcg by mouth daily.      tretinoin (RETIN-A) 0.05 % cream APPLY TOPICALLY TO THE AFFECTED AREA AT BEDTIME 45 g 0   Cyanocobalamin (B-12 PO) Take by mouth. (Patient not taking: Reported on 11/04/2023)     montelukast (SINGULAIR) 10 MG tablet TAKE 1 TABLET BY MOUTH DAILY FOR ALLERGIES (Patient not taking: Reported on 11/04/2023) 90 tablet 3   Semaglutide-Weight Management (WEGOVY) 2.4 MG/0.75ML SOAJ Inject 2.4 mg into the skin once a week. (Patient not taking: Reported on  11/04/2023) 9 mL 2   No current facility-administered medications on file prior to visit.   Allergies:  Allergies  Allergen Reactions   Other Other (See Comments)    steroid eye drops-causes glaucoma    Medical History:  She has Situational stress; Hashimoto's thyroiditis; History of HSV-1 infection; Vitamin D deficiency; Obesity (BMI 35.0-39.9 without comorbidity); and Thyromegaly on their problem list. Health Maintenance:   Immunization History  Administered Date(s) Administered   HPV Quadrivalent 11/19/2003, 01/10/2006, 03/11/2006, 07/10/2006   Hep B, Unspecified 11/18/1990   Influenza,inj,Quad PF,6+ Mos 08/18/2019   Influenza-Unspecified 08/24/2018, 08/18/2021   Moderna Sars-Covid-2 Vaccination 12/06/2019, 01/07/2020   Tdap 06/19/2015   Unspecified SARS-COV-2 Vaccination 03/09/2020   Health Maintenance  Topic Date Due   Cervical Cancer Screening (HPV/Pap Cotest)  12/21/2019  COVID-19 Vaccine (4 - 2024-25 season) 07/20/2023   DTaP/Tdap/Td (2 - Td or Tdap) 06/18/2025   INFLUENZA VACCINE  Completed   HPV VACCINES  Completed   Hepatitis C Screening  Completed   HIV Screening  Completed   Pap normal 06/2023 at GYN  Patient Care Team: Lucky Cowboy, MD as PCP - General (Internal Medicine) Janalyn Harder, MD (Inactive) as Consulting Physician (Dermatology)  Surgical History:  She has a past surgical history that includes Cholecystectomy, laparoscopic (2004); Tonsillectomy and adenoidectomy (2002); Colposcopy (2002); Cesarean section (N/A, 06/21/2016); and Breast biopsy (Right, 08/20/2023). Family History:  Herfamily history includes ALS in her maternal grandmother; ALS (age of onset: 52) in her maternal uncle; Alzheimer's disease in her maternal grandfather; Breast cancer (age of onset: 20) in her maternal grandmother; Depression in her mother; Leukemia (age of onset: 26) in her paternal aunt; Lung cancer in her paternal grandfather; Prostate cancer in her maternal grandfather;  Suicidality in her mother; Thyroid cancer in her maternal aunt. Social History:  She reports that she quit smoking about 20 years ago. Her smoking use included cigarettes. She started smoking about 25 years ago. She has a 0.6 pack-year smoking history. She has never used smokeless tobacco. She reports that she does not drink alcohol and does not use drugs.   Review of Systems: Review of Systems  Constitutional:  Positive for malaise/fatigue. Negative for weight loss.       Weight gain  HENT:  Negative for hearing loss and tinnitus.   Eyes:  Negative for blurred vision and double vision.  Respiratory:  Negative for cough, shortness of breath and wheezing.   Cardiovascular:  Negative for chest pain, palpitations, orthopnea, claudication and leg swelling.  Gastrointestinal:  Negative for abdominal pain, blood in stool, constipation, diarrhea, heartburn, melena, nausea and vomiting.       Occasional difficulty swallowing food   Genitourinary: Negative.   Musculoskeletal:  Negative for joint pain and myalgias.  Skin:  Negative for rash.  Neurological:  Positive for tingling (occ numness and tingling of finger). Negative for dizziness, sensory change, weakness and headaches.  Endo/Heme/Allergies:  Negative for polydipsia.  Psychiatric/Behavioral:  Negative for depression, substance abuse and suicidal ideas. The patient is not nervous/anxious and does not have insomnia.   All other systems reviewed and are negative.   Physical Exam: Estimated body mass index is 35.52 kg/m as calculated from the following:   Height as of this encounter: 5\' 2"  (1.575 m).   Weight as of this encounter: 194 lb 3.2 oz (88.1 kg). BP 118/68   Pulse 66   Temp 97.7 F (36.5 C)   Ht 5\' 2"  (1.575 m)   Wt 194 lb 3.2 oz (88.1 kg)   SpO2 99%   BMI 35.52 kg/m    General Appearance: Well nourished, in no apparent distress.  Eyes: PERRLA, EOMs, conjunctiva no swelling or erythema Sinuses: No Frontal/maxillary  tenderness  ENT/Mouth: Ext aud canals clear, normal light reflex with TMs without erythema, bulging. Good dentition. No erythema, swelling, or exudate on post pharynx. Tonsils not swollen or erythematous. Hearing normal.  Neck: Supple, thyroid normal No bruits  Respiratory: Respiratory effort normal, BS equal bilaterally without rales, rhonchi, wheezing or stridor.  Cardio: RRR without murmurs, rubs or gallops. Brisk peripheral pulses without edema.  Chest: symmetric, with normal excursions and percussion.  Breasts: defer to GYN  Abdomen: Soft, nontender, no guarding, rebound, hernias, masses, or organomegaly.  Lymphatics: Non tender without lymphadenopathy.  Genitourinary: Defer to GYN Musculoskeletal:  Full ROM all peripheral extremities,5/5 strength, and normal gait.  Skin: Warm, dry without rashes, lesions, ecchymosis. Neuro: Cranial nerves intact, reflexes equal bilaterally. Normal muscle tone, no cerebellar symptoms. Sensation intact.  Psych: Awake and oriented X 3, normal affect, Insight and Judgment appropriate.   EKG: NSR, no ST changes  Lynora Dymond Hollie Salk, NP 3:05 PM [facility]

## 2023-11-04 NOTE — Patient Instructions (Signed)
Fair life protein shakes Eat more frequently - try not to go more than 6 hours without protein Aim for 90 grams of protein a day- 30 breakfast/30 lunch 30 dinner Try to keep net carbs less than 50 Net Carbs=Total Carbs-fiber- sugar alcohols Exercise heartrate 120-140(fat burning zone)- walking 20-30 minutes 4 days a week  

## 2023-11-05 LAB — COMPLETE METABOLIC PANEL WITH GFR
AG Ratio: 1.5 (calc) (ref 1.0–2.5)
ALT: 13 U/L (ref 6–29)
AST: 11 U/L (ref 10–30)
Albumin: 4 g/dL (ref 3.6–5.1)
Alkaline phosphatase (APISO): 78 U/L (ref 31–125)
BUN: 17 mg/dL (ref 7–25)
CO2: 30 mmol/L (ref 20–32)
Calcium: 9.6 mg/dL (ref 8.6–10.2)
Chloride: 104 mmol/L (ref 98–110)
Creat: 0.72 mg/dL (ref 0.50–0.99)
Globulin: 2.7 g/dL (ref 1.9–3.7)
Glucose, Bld: 116 mg/dL — ABNORMAL HIGH (ref 65–99)
Potassium: 4.7 mmol/L (ref 3.5–5.3)
Sodium: 140 mmol/L (ref 135–146)
Total Bilirubin: 0.3 mg/dL (ref 0.2–1.2)
Total Protein: 6.7 g/dL (ref 6.1–8.1)
eGFR: 106 mL/min/{1.73_m2} (ref >=60)

## 2023-11-05 LAB — LIPID PANEL
Cholesterol: 162 mg/dL (ref <200)
HDL: 61 mg/dL (ref >=50)
LDL Cholesterol (Calc): 81 mg/dL
Non-HDL Cholesterol (Calc): 101 mg/dL (ref <130)
Total CHOL/HDL Ratio: 2.7 (calc) (ref <5.0)
Triglycerides: 103 mg/dL (ref <150)

## 2023-11-05 LAB — HEMOGLOBIN A1C W/OUT EAG: Hgb A1c MFr Bld: 5.5 %{Hb} (ref <5.7)

## 2023-11-05 LAB — URINALYSIS, ROUTINE W REFLEX MICROSCOPIC
Bilirubin Urine: NEGATIVE
Glucose, UA: NEGATIVE
Hgb urine dipstick: NEGATIVE
Ketones, ur: NEGATIVE
Leukocytes,Ua: NEGATIVE
Nitrite: NEGATIVE
Protein, ur: NEGATIVE
Specific Gravity, Urine: 1.014 (ref 1.001–1.035)
pH: 7 (ref 5.0–8.0)

## 2023-11-05 LAB — CBC WITH DIFFERENTIAL/PLATELET
Absolute Lymphocytes: 2037 {cells}/uL (ref 850–3900)
Absolute Monocytes: 788 {cells}/uL (ref 200–950)
Basophils Absolute: 29 {cells}/uL (ref 0–200)
Basophils Relative: 0.4 %
Eosinophils Absolute: 212 {cells}/uL (ref 15–500)
Eosinophils Relative: 2.9 %
HCT: 40.9 % (ref 35.0–45.0)
Hemoglobin: 13.4 g/dL (ref 11.7–15.5)
MCH: 30.5 pg (ref 27.0–33.0)
MCHC: 32.8 g/dL (ref 32.0–36.0)
MCV: 93.2 fL (ref 80.0–100.0)
MPV: 10.1 fL (ref 7.5–12.5)
Monocytes Relative: 10.8 %
Neutro Abs: 4234 {cells}/uL (ref 1500–7800)
Neutrophils Relative %: 58 %
Platelets: 327 10*3/uL (ref 140–400)
RBC: 4.39 10*6/uL (ref 3.80–5.10)
RDW: 11.9 % (ref 11.0–15.0)
Total Lymphocyte: 27.9 %
WBC: 7.3 10*3/uL (ref 3.8–10.8)

## 2023-11-05 LAB — MICROALBUMIN / CREATININE URINE RATIO
Creatinine, Urine: 49 mg/dL (ref 20–275)
Microalb, Ur: <0.2 mg/dL

## 2023-11-05 LAB — MAGNESIUM: Magnesium: 2 mg/dL (ref 1.5–2.5)

## 2023-11-05 LAB — VITAMIN D 25 HYDROXY (VIT D DEFICIENCY, FRACTURES): Vit D, 25-Hydroxy: 44 ng/mL (ref 30–100)

## 2023-11-05 LAB — TSH: TSH: 1.77 m[IU]/L

## 2023-11-21 ENCOUNTER — Encounter: Payer: Self-pay | Admitting: Nurse Practitioner

## 2023-11-21 ENCOUNTER — Other Ambulatory Visit: Payer: Self-pay | Admitting: Nurse Practitioner

## 2023-11-21 DIAGNOSIS — R1310 Dysphagia, unspecified: Secondary | ICD-10-CM

## 2023-12-11 ENCOUNTER — Other Ambulatory Visit: Payer: Self-pay | Admitting: Nurse Practitioner

## 2023-12-11 DIAGNOSIS — E669 Obesity, unspecified: Secondary | ICD-10-CM

## 2023-12-23 NOTE — Progress Notes (Signed)
 Chief Complaint:dysphagia Primary GI Doctor:unassigned  HPI: Patient is a 43 year old female patient with past medical history of migraines,obesity, and Hashimoto's thyroiditis ,who was referred to me by Tonita Fallow, MD on 11/21/23 for a complaint of dysphagia .    Interval History     Patient presents with main complaint of intermittent esophageal dysphagia. Patient has history of GERD with hiatal hernia diagnosed in her 51's. She started to have esophageal dysphagia in October-November. She states food would get lodged in her throat and she would have to drink liquids to get food down or straighten her neck upward to clear the passage. History of Hashimoto's but states her levels were recently checked and normal. Last thyroid  ultrasound was in 2020. Last episode of dysphagia was in December. She uses Prilosec 20 mg prn for symptoms of pyrosis or regurgitation which occurrs few times a month and it helps. She went off her Wegovy  recently and admits she has gained some weight which potentially could increase her symptoms of GERD. Patient denies nausea, vomiting, or weight loss. Patient also has history of chronic constipation, admits she does not consume enough water and fiber. Patient denies abdominal pain or rectal bleeding. Socially drinks. Nonsmoker. Patients last EGD several years ago.  Patient's family history includes paternal grandfather side with prostate and colon CA.  Wt Readings from Last 3 Encounters:  12/25/23 195 lb (88.5 kg)  11/04/23 194 lb 3.2 oz (88.1 kg)  04/30/23 178 lb 9.6 oz (81 kg)    Past Medical History:  Diagnosis Date   Abnormal Pap smear 2002   Colpo and Laser HPV   Former smoker    Headache    migraines   Hiatal hernia    HSV-1 (herpes simplex virus 1) infection 02/2010   positive on peri-anal c & s   Hx of migraines 03/07/2016   Obesity    Vaginal Pap smear, abnormal    Past Surgical History:  Procedure Laterality Date   BREAST BIOPSY Right  08/20/2023   MM RT BREAST BX W LOC DEV 1ST LESION IMAGE BX SPEC STEREO GUIDE 08/20/2023 [facility]   CESAREAN SECTION N/A 06/21/2016   Procedure: CESAREAN SECTION;  Surgeon: Ovid All, MD;  Location: [facility];  Service: Obstetrics;  Laterality: N/A;   CHOLECYSTECTOMY, LAPAROSCOPIC  11/18/2002   COLPOSCOPY  11/18/2000   HPV, laser   ESOPHAGOGASTRODUODENOSCOPY     TONSILLECTOMY AND ADENOIDECTOMY  11/18/2000    Current Outpatient Medications  Medication Sig Dispense Refill   Cholecalciferol (VITAMIN D ) 50 MCG (2000 UT) tablet Take 5,000 Units by mouth daily.     Drospirenone (SLYND) 4 MG TABS Take by mouth daily.     phentermine  (ADIPEX-P ) 37.5 MG tablet TAKE 1/2 TO 1 (ONE-HALF TO ONE) TABLET BY MOUTH ONCE DAILY IN THE MORNING FOR  DIETING  AND  WEIGHT  LOSS 30 tablet 0   selenium 50 MCG TABS tablet Take 200 mcg by mouth daily.      topiramate  (TOPAMAX ) 50 MG tablet Take 1 tablet (50 mg total) by mouth 2 (two) times daily. 60 tablet 2   tretinoin  (RETIN-A ) 0.05 % cream APPLY TOPICALLY TO THE AFFECTED AREA AT BEDTIME 45 g 0   No current facility-administered medications for this visit.   Allergies as of 12/25/2023 - Review Complete 12/25/2023  Allergen Reaction Noted   Other Other (See Comments) 03/07/2016   Family History  Problem Relation Age of Onset   Depression Mother    Suicidality Mother  Lung cancer Paternal Grandfather    ALS Maternal Grandmother    Breast cancer Maternal Grandmother 73   Alzheimer's disease Maternal Grandfather    Prostate cancer Maternal Grandfather    Thyroid  cancer Maternal Aunt    ALS Maternal Uncle 74   Leukemia Paternal Aunt 86       CLL   Review of Systems:    Constitutional: No weight loss, fever, chills, weakness or fatigue HEENT: Eyes: No change in vision               Ears, Nose, Throat:  No change in hearing or congestion Skin: No rash or itching Cardiovascular: No chest pain, chest pressure or palpitations    Respiratory: No SOB or cough Gastrointestinal: See HPI and otherwise negative Genitourinary: No dysuria or change in urinary frequency Neurological: No headache, dizziness or syncope Musculoskeletal: No new muscle or joint pain Hematologic: No bleeding or bruising Psychiatric: No history of depression or anxiety   Physical Exam:  Vital signs: BP 119/70   Pulse 69   Ht 5' 2 (1.575 m)   Wt 195 lb (88.5 kg)   SpO2 97%   BMI 35.67 kg/m   Constitutional:  Pleasant Caucasian female appears to be in NAD, Well developed, Well nourished, alert and cooperative Neck:  Supple Throat: Oral cavity and pharynx without inflammation, swelling or lesion.  Respiratory: Respirations even and unlabored. Lungs clear to auscultation bilaterally.   No wheezes, crackles, or rhonchi.  Cardiovascular: Normal S1, S2. Regular rate and rhythm. No peripheral edema, cyanosis or pallor.  Gastrointestinal:  Soft, nondistended, nontender.Obese. No rebound or guarding. Normal bowel sounds. No appreciable masses or hepatomegaly. Rectal:  Not performed.  Msk:  Symmetrical without gross deformities. Without edema, no deformity or joint abnormality.  Neurologic:  Alert and  oriented x4;  grossly normal neurologically.  Skin:   Dry and intact without significant lesions or rashes. Psychiatric: Oriented to person, place and time. Demonstrates good judgement and reason without abnormal affect or behaviors.  RELEVANT LABS AND IMAGING: CBC    Latest Ref Rng & Units 11/04/2023    3:46 PM 10/25/2022   12:00 AM 07/10/2022   10:03 AM  CBC  WBC 3.8 - 10.8 Thousand/uL 7.3  5.9  5.0   Hemoglobin 11.7 - 15.5 g/dL 86.5  85.4  85.7   Hematocrit 35.0 - 45.0 % 40.9  42.9  42.0   Platelets 140 - 400 Thousand/uL 327  361  300      CMP     Latest Ref Rng & Units 11/04/2023    3:46 PM 10/25/2022   12:00 AM 07/10/2022   10:03 AM  CMP  Glucose 65 - 99 mg/dL 883  70  62   BUN 7 - 25 mg/dL 17  11  10    Creatinine 0.50 - 0.99  mg/dL 9.27  9.15  9.16   Sodium 135 - 146 mmol/L 140  140  140   Potassium 3.5 - 5.3 mmol/L 4.7  4.1  4.6   Chloride 98 - 110 mmol/L 104  105  106   CO2 20 - 32 mmol/L 30  27  26    Calcium 8.6 - 10.2 mg/dL 9.6  9.7  9.7   Total Protein 6.1 - 8.1 g/dL 6.7  7.3  7.1   Total Bilirubin 0.2 - 1.2 mg/dL 0.3  0.6  0.7   AST 10 - 30 U/L 11  13  12    ALT 6 - 29 U/L 13  12  10      Lab Results  Component Value Date   TSH 1.77 11/04/2023     Assessment: Encounter Diagnoses  Name Primary?   Esophageal dysphagia Yes   Gastroesophageal reflux disease, unspecified whether esophagitis present    Chronic idiopathic constipation    43 year old female patient with history of Hashimoto's disease and GERD who presents with new onset esophageal dysphagia. She is using PPI therapy prn. She has gained weight being off Wegovy  which could increase GERD symptoms. We will scheduled EGD with possible dilatation with Dr. Shila. Also recommended she Myliyah Rebuck want to follow-up with endocrinologist for Thyroid  U/s, has been 5 years since last. Patient would like to wait for endoscopy before starting PPI therapy.    She also has mild constipation that is managed with fluid intake and high fiber intake. No changes/recommendations at this time.  Plan: -Reinforced high fiber diet - Schedule EGD with possible dilatation in [facility] with Dr. Nandigam. The risks and benefits of EGD with possible biopsies and esophageal dilation were discussed with the patient who agrees to proceed.  -Colonoscopy will be due 03/2024, we briefly discussed scheduling later this year, patient agrees.  Thank you for the courtesy of this consult. Please call me with any questions or concerns.   Solon Alban, FNP-C [facility] 12/25/2023, 9:02 AM  Cc: Tonita Fallow, MD

## 2023-12-25 ENCOUNTER — Encounter: Payer: Self-pay | Admitting: Gastroenterology

## 2023-12-25 ENCOUNTER — Ambulatory Visit: Payer: BC Managed Care – PPO | Admitting: Gastroenterology

## 2023-12-25 VITALS — BP 119/70 | HR 69 | Ht 62.0 in | Wt 195.0 lb

## 2023-12-25 DIAGNOSIS — R1319 Other dysphagia: Secondary | ICD-10-CM

## 2023-12-25 DIAGNOSIS — K5904 Chronic idiopathic constipation: Secondary | ICD-10-CM | POA: Diagnosis not present

## 2023-12-25 DIAGNOSIS — K219 Gastro-esophageal reflux disease without esophagitis: Secondary | ICD-10-CM | POA: Diagnosis not present

## 2023-12-25 DIAGNOSIS — R1310 Dysphagia, unspecified: Secondary | ICD-10-CM | POA: Diagnosis not present

## 2023-12-25 NOTE — Patient Instructions (Signed)
 You have been scheduled for an endoscopy. Please follow written instructions given to you at your visit today.  If you use inhalers (even only as needed), please bring them with you on the day of your procedure.  If you take any of the following medications, they will need to be adjusted prior to your procedure:   DO NOT TAKE 7 DAYS PRIOR TO TEST- Trulicity (dulaglutide) Ozempic, Wegovy  (semaglutide ) Mounjaro (tirzepatide) Bydureon Bcise (exanatide extended release)  DO NOT TAKE 1 DAY PRIOR TO YOUR TEST Rybelsus (semaglutide ) Adlyxin (lixisenatide) Victoza (liraglutide) Byetta (exanatide) ___________________________________________________________________________   _______________________________________________________  If your blood pressure at your visit was 140/90 or greater, please contact your primary care physician to follow up on this.  _______________________________________________________  If you are age 45 or older, your body mass index should be between 23-30. Your Body mass index is 35.67 kg/m. If this is out of the aforementioned range listed, please consider follow up with your Primary Care Provider.  If you are age 45 or younger, your body mass index should be between 19-25. Your Body mass index is 35.67 kg/m. If this is out of the aformentioned range listed, please consider follow up with your Primary Care Provider.   ________________________________________________________  The [facility] providers would like to encourage you to use MYCHART to communicate with providers for non-urgent requests or questions.  Due to long hold times on the telephone, sending your provider a message by [facility] may be a faster and more efficient way to get a response.  Please allow 48 business hours for a response.  Please remember that this is for non-urgent requests.  _______________________________________________________  Due to recent changes in healthcare laws, you may see  the results of your imaging and laboratory studies on MyChart before your provider has had a chance to review them.  We understand that in some cases there may be results that are confusing or concerning to you. Not all laboratory results come back in the same time frame and the provider may be waiting for multiple results in order to interpret others.  Please give us  48 hours in order for your provider to thoroughly review all the results before contacting the office for clarification of your results.   Thank you for choosing me and [facility].  Deanna May, NP

## 2024-01-30 ENCOUNTER — Telehealth: Payer: Self-pay

## 2024-01-30 ENCOUNTER — Ambulatory Visit: Payer: BC Managed Care – PPO | Admitting: Gastroenterology

## 2024-01-30 ENCOUNTER — Encounter: Payer: Self-pay | Admitting: Gastroenterology

## 2024-01-30 ENCOUNTER — Other Ambulatory Visit (HOSPITAL_COMMUNITY): Payer: Self-pay

## 2024-01-30 VITALS — BP 126/72 | HR 69 | Temp 99.1°F | Resp 18 | Ht 62.0 in | Wt 195.0 lb

## 2024-01-30 DIAGNOSIS — R1319 Other dysphagia: Secondary | ICD-10-CM

## 2024-01-30 DIAGNOSIS — K449 Diaphragmatic hernia without obstruction or gangrene: Secondary | ICD-10-CM

## 2024-01-30 DIAGNOSIS — Z3202 Encounter for pregnancy test, result negative: Secondary | ICD-10-CM | POA: Diagnosis not present

## 2024-01-30 DIAGNOSIS — K222 Esophageal obstruction: Secondary | ICD-10-CM

## 2024-01-30 DIAGNOSIS — R1310 Dysphagia, unspecified: Secondary | ICD-10-CM | POA: Diagnosis not present

## 2024-01-30 LAB — POCT URINE PREGNANCY: Preg Test, Ur: NEGATIVE

## 2024-01-30 MED ORDER — SODIUM CHLORIDE 0.9 % IV SOLN: 500.0000 mL | Freq: Once | INTRAVENOUS | Status: DC

## 2024-01-30 MED ORDER — PANTOPRAZOLE SODIUM 40 MG PO TBEC: 40.0000 mg | DELAYED_RELEASE_TABLET | Freq: Every day | ORAL | 3 refills | Status: AC

## 2024-01-30 NOTE — Progress Notes (Signed)
 After discussing more with patient, Dr Lavon Paganini decided to proceed with procedure.

## 2024-01-30 NOTE — Progress Notes (Signed)
 Patient took Phentermine On Monday, per Dr. Lavon Paganini need to reschedule procedure

## 2024-01-30 NOTE — Progress Notes (Signed)
 Called to room to assist during endoscopic procedure.  Patient ID and intended procedure confirmed with present staff. Received instructions for my participation in the procedure from the performing physician.

## 2024-01-30 NOTE — Progress Notes (Signed)
 [facility] History and Physical   Primary Care Physician:  Lucky Cowboy, MD   Reason for Procedure:  Dysphagia  Plan:    EGD  with possible interventions as needed     HPI: [name] [name] is a very pleasant 43 y.o. female here for EGD for dysphagia. Please refer to office visit note by [facility] for additional details.   The risks and benefits as well as alternatives of endoscopic procedure(s) have been discussed and reviewed. All questions answered. The patient agrees to proceed.    Past Medical History:  Diagnosis Date   Abnormal Pap smear 2002   Colpo and Laser HPV   Allergy    Former smoker    GERD (gastroesophageal reflux disease)    Headache    migraines   Hiatal hernia    HSV-1 (herpes simplex virus 1) infection 02/2010   positive on peri-anal c & s   Hx of migraines 03/07/2016   Obesity    Thyroid disease    Vaginal Pap smear, abnormal     Past Surgical History:  Procedure Laterality Date   BREAST BIOPSY Right 08/20/2023   MM RT BREAST BX W LOC DEV 1ST LESION IMAGE BX SPEC STEREO GUIDE 08/20/2023 [facility]   CESAREAN SECTION N/A 06/21/2016   Procedure: CESAREAN SECTION;  Surgeon: Jaymes Graff, MD;  Location: [facility];  Service: Obstetrics;  Laterality: N/A;   CHOLECYSTECTOMY, LAPAROSCOPIC  11/18/2002   COLPOSCOPY  11/18/2000   HPV, laser   ESOPHAGOGASTRODUODENOSCOPY     TONSILLECTOMY AND ADENOIDECTOMY  11/18/2000    Prior to Admission medications   Medication Sig Start Date End Date Taking? Authorizing Provider  Cholecalciferol (VITAMIN D) 50 MCG (2000 UT) tablet Take 5,000 Units by mouth daily.   Yes [provider]  Drospirenone (SLYND) 4 MG TABS Take by mouth daily.   Yes [provider]  selenium 50 MCG TABS tablet Take 200 mcg by mouth daily.    Yes [provider]  topiramate (TOPAMAX) 50 MG tablet Take 1 tablet (50 mg total) by mouth 2 (two) times daily. 11/04/23 11/03/24 Yes  Raynelle Dick, NP  phentermine (ADIPEX-P) 37.5 MG tablet TAKE 1/2 TO 1 (ONE-HALF TO ONE) TABLET BY MOUTH ONCE DAILY IN THE MORNING FOR  DIETING  AND  WEIGHT  LOSS 12/15/23   Adela Glimpse, NP  tretinoin (RETIN-A) 0.05 % cream APPLY TOPICALLY TO THE AFFECTED AREA AT BEDTIME 06/03/23   Raynelle Dick, NP    Current Outpatient Medications  Medication Sig Dispense Refill   Cholecalciferol (VITAMIN D) 50 MCG (2000 UT) tablet Take 5,000 Units by mouth daily.     Drospirenone (SLYND) 4 MG TABS Take by mouth daily.     selenium 50 MCG TABS tablet Take 200 mcg by mouth daily.      topiramate (TOPAMAX) 50 MG tablet Take 1 tablet (50 mg total) by mouth 2 (two) times daily. 60 tablet 2   phentermine (ADIPEX-P) 37.5 MG tablet TAKE 1/2 TO 1 (ONE-HALF TO ONE) TABLET BY MOUTH ONCE DAILY IN THE MORNING FOR  DIETING  AND  WEIGHT  LOSS 30 tablet 0   tretinoin (RETIN-A) 0.05 % cream APPLY TOPICALLY TO THE AFFECTED AREA AT BEDTIME 45 g 0   Current Facility-Administered Medications  Medication Dose Route Frequency Provider Last Rate Last Admin   0.9 %  sodium chloride infusion  500 mL Intravenous Once Napoleon Form, MD        Allergies as of 01/30/2024 - Review  Complete 01/30/2024  Allergen Reaction Noted   Other Other (See Comments) 03/07/2016    Family History  Problem Relation Age of Onset   Depression Mother    Suicidality Mother    Thyroid cancer Maternal Aunt    ALS Maternal Uncle 44   Leukemia Paternal Aunt 13       CLL   ALS Maternal Grandmother    Breast cancer Maternal Grandmother 77   Alzheimer's disease Maternal Grandfather    Prostate cancer Maternal Grandfather    Lung cancer Paternal Grandfather    Colon cancer Neg Hx    Esophageal cancer Neg Hx    Stomach cancer Neg Hx    Rectal cancer Neg Hx     Social History   Socioeconomic History   Marital status: Single    Spouse name: Not on file   Number of children: 1   Years of education: Not on file   Highest  education level: Not on file  Occupational History    Employer: [employer]  Tobacco Use   Smoking status: Former    Current packs/day: 0.00    Average packs/day: 0.1 packs/day for 5.0 years (0.6 ttl pk-yrs)    Types: Cigarettes    Start date: 06/19/1998    Quit date: 06/20/2003    Years since quitting: 20.6   Smokeless tobacco: Never   Tobacco comments:    in college  Vaping Use   Vaping status: Never Used  Substance and Sexual Activity   Alcohol use: No   Drug use: No   Sexual activity: Not Currently    Partners: Male    Birth control/protection: Condom  Other Topics Concern   Not on file  Social History Narrative   Not on file   Social Drivers of Health   Financial Resource Strain: Not on file  Food Insecurity: Not on file  Transportation Needs: Not on file  Physical Activity: Insufficiently Active (08/05/2018)   Exercise Vital Sign    Days of Exercise per Week: 2 days    Minutes of Exercise per Session: 30 min  Stress: No Stress Concern Present (08/05/2018)   [employer] of Occupational Health - Occupational Stress Questionnaire    Feeling of Stress : Only a little  Social Connections: Not on file  Intimate Partner Violence: Not on file    Review of Systems:  All other review of systems negative except as mentioned in the HPI.  Physical Exam: Vital signs in last 24 hours: BP 134/85   Pulse 74   Temp 99.1 F (37.3 C)   Ht 5\' 2"  (1.575 m)   Wt 195 lb (88.5 kg)   SpO2 100%   BMI 35.67 kg/m  General:   Alert, NAD Lungs:  Clear .   Heart:  Regular rate and rhythm Abdomen:  Soft, nontender and nondistended. Neuro/Psych:  Alert and cooperative. Normal mood and affect. A and O x 3  Reviewed labs, radiology imaging, old records and pertinent past GI work up  Patient is appropriate for planned procedure(s) and anesthesia in an ambulatory setting   K. Scherry Ran , MD [phone]

## 2024-01-30 NOTE — Telephone Encounter (Signed)
 Pharmacy Patient Advocate Encounter   Received notification from CoverMyMeds that prior authorization for Pantoprazole 40 mg tablets is required/requested.   Insurance verification completed.   The patient is insured through [facility] .   Per test claim: PA required; PA submitted to above mentioned insurance via CoverMyMeds Key/confirmation #/EOC [id number] Status is pending

## 2024-01-30 NOTE — Op Note (Addendum)
 [facility] Patient Name: [name] [name] Procedure Date: 01/30/2024 1:55 PM MRN: [id number] Endoscopist: Napoleon Form , MD, [id number] Age: 43 Referring MD:  Date of Birth: Feb 20, 1981 Gender: Female Account #: [id number] Procedure:                Upper GI endoscopy Indications:              Dysphagia Medicines:                Monitored Anesthesia Care Procedure:                Pre-Anesthesia Assessment:                           - Prior to the procedure, a History and Physical                            was performed, and patient medications and                            allergies were reviewed. The patient's tolerance of                            previous anesthesia was also reviewed. The risks                            and benefits of the procedure and the sedation                            options and risks were discussed with the patient.                            All questions were answered, and informed consent                            was obtained. Prior Anticoagulants: The patient has                            taken no anticoagulant or antiplatelet agents. ASA                            Grade Assessment: II - A patient with mild systemic                            disease. After reviewing the risks and benefits,                            the patient was deemed in satisfactory condition to                            undergo the procedure.                           After obtaining informed consent, the endoscope was  passed under direct vision. Throughout the                            procedure, the patient's blood pressure, pulse, and                            oxygen saturations were monitored continuously. The                            Olympus Scope [id number] was introduced through the                            mouth, and advanced to the second part of duodenum.                            The upper GI endoscopy  was accomplished without                            difficulty. The patient tolerated the procedure                            well. Scope In: Scope Out: Findings:                 One benign-appearing, intrinsic mild stenosis was                            found 30 to 31 cm from the incisors. This stenosis                            measured 1.8 cm (inner diameter) x 1 cm (in                            length). The stenosis was traversed. A TTS dilator                            was passed through the scope. Dilation with an                            18-19-20 mm x 8 cm CRE balloon dilator was                            performed to 20 mm. The dilation site was examined                            following endoscope reinsertion and showed mild                            improvement in luminal narrowing.                           A 5 cm hiatal hernia was present.  The stomach was normal.                           The cardia and gastric fundus were normal on                            retroflexion.                           The examined duodenum was normal. Complications:            No immediate complications. Estimated Blood Loss:     Estimated blood loss was minimal. Impression:               - Benign-appearing esophageal stenosis. Dilated.                           - 5 cm hiatal hernia.                           - Normal stomach.                           - Normal examined duodenum.                           - No specimens collected. Recommendation:           - Resume previous diet.                           - Continue present medications.                           - Follow an antireflux regimen.                           - Pantoprazole 40mg  daily before dinner                           - Follow up in GI office in 6 months Napoleon Form, MD 01/30/2024 2:28:59 PM This report has been signed electronically.

## 2024-01-30 NOTE — Progress Notes (Signed)
 A/o x 3, VSS, gd SR's, pleased with anesthesia, report to RN

## 2024-01-30 NOTE — Patient Instructions (Addendum)
 YOU HAD AN ENDOSCOPIC PROCEDURE TODAY AT THE [facility]:   Refer to the procedure report that was given to you for any specific questions about what was found during the examination.  If the procedure report does not answer your questions, please call your gastroenterologist to clarify.  If you requested that your care partner not be given the details of your procedure findings, then the procedure report has been included in a sealed envelope for you to review at your convenience later.  YOU SHOULD EXPECT: Some feelings of bloating in the abdomen. Passage of more gas than usual.  Walking can help get rid of the air that was put into your GI tract during the procedure and reduce the bloating. If you had a lower endoscopy (such as a colonoscopy or flexible sigmoidoscopy) you may notice spotting of blood in your stool or on the toilet paper. If you underwent a bowel prep for your procedure, you may not have a normal bowel movement for a few days.  Please Note:  You might notice some irritation and congestion in your nose or some drainage.  This is from the oxygen used during your procedure.  There is no need for concern and it should clear up in a day or so.  SYMPTOMS TO REPORT IMMEDIATELY:   Following upper endoscopy (EGD)  Vomiting of blood or coffee ground material  New chest pain or pain under the shoulder blades  Painful or persistently difficult swallowing  New shortness of breath  Fever of 100F or higher  Black, tarry-looking stools  For urgent or emergent issues, a gastroenterologist can be reached at any hour by calling [phone]. Do not use MyChart messaging for urgent concerns.    DIET:  We do recommend a small meal at first, but then you may proceed to your regular diet.  Drink plenty of fluids but you should avoid alcoholic beverages for 24 hours.  MEDICATIONS: Continue present medications. Use Pantoprazole 40 mg (#90 with 3 RF RX sent to your pharmacy of  choice).  FOLLOW UP: Follow an antireflux regimen.  Please see handouts given to you by your recovery nurse: Hiatal Hernia, Antireflux precautions.  Thank you for allowing [place] to provide for your healthcare needs today.  ACTIVITY:  You should plan to take it easy for the rest of today and you should NOT DRIVE or use heavy machinery until tomorrow (because of the sedation medicines used during the test).    FOLLOW UP: Our staff will call the number listed on your records the next business day following your procedure.  We will call around 7:15- 8:00 am to check on you and address any questions or concerns that you may have regarding the information given to you following your procedure. If we do not reach you, we will leave a message.     If any biopsies were taken you will be contacted by phone or by letter within the next 1-3 weeks.  Please call us at [phone] if you have not heard about the biopsies in 3 weeks.    SIGNATURES/CONFIDENTIALITY: You and/or your care partner have signed paperwork which will be entered into your electronic medical record.  These signatures attest to the fact that that the information above on your After Visit Summary has been reviewed and is understood.  Full responsibility of the confidentiality of this discharge information lies with you and/or your care-partner.

## 2024-02-02 ENCOUNTER — Telehealth: Payer: Self-pay

## 2024-02-02 NOTE — Telephone Encounter (Signed)
Left HIPAA compliant voicemail. 

## 2024-02-04 ENCOUNTER — Other Ambulatory Visit (HOSPITAL_COMMUNITY): Payer: Self-pay

## 2024-02-04 NOTE — Telephone Encounter (Signed)
 Pharmacy Patient Advocate Encounter  Received notification from [facility] that Prior Authorization for PANTOPRAZOLE 40MG  has been DENIED.  Full denial letter will be uploaded to the media tab. See denial reason below.    PA #/Case ID/Reference #: [id number]

## 2024-02-05 NOTE — Telephone Encounter (Signed)
 In [name] [name]'s last office note she stated that the patient was on PPI therapy of Prilosec 20 mg daily This [name] help with the prior authorization  Thanks!

## 2024-02-06 NOTE — Telephone Encounter (Signed)
 Per insurance denial letter, patient must have tried at least THREE of the OTC alternatives. These include the omeprazole patient has tried AND esomeprazole delayed-release capsule and lansoprazole delayed-release capsule.

## 2024-02-06 NOTE — Telephone Encounter (Signed)
 Needs Nexium and lansoprazole  I called patient and left a message on her phone to call me back and let me know if she wants to try the Nexium or Lansoprazole due to the insurance requiring her to have tried and failed those medications on top of her previously failing omeprazole before they will pay for pantoprazole

## 2024-02-09 NOTE — Telephone Encounter (Signed)
 Noted.

## 2024-02-09 NOTE — Telephone Encounter (Signed)
 Patient returned your call stated she went ahead and paid $30.00 for the medication, no longer need to change it.

## 2024-02-13 ENCOUNTER — Ambulatory Visit: Payer: BC Managed Care – PPO | Admitting: Nurse Practitioner

## 2024-03-26 ENCOUNTER — Ambulatory Visit (INDEPENDENT_AMBULATORY_CARE_PROVIDER_SITE_OTHER): Admitting: Internal Medicine

## 2024-03-26 ENCOUNTER — Encounter: Payer: Self-pay | Admitting: Internal Medicine

## 2024-03-26 VITALS — BP 118/70 | HR 77 | Ht 62.0 in | Wt 196.4 lb

## 2024-03-26 DIAGNOSIS — E063 Autoimmune thyroiditis: Secondary | ICD-10-CM | POA: Diagnosis not present

## 2024-03-26 DIAGNOSIS — E010 Iodine-deficiency related diffuse (endemic) goiter: Secondary | ICD-10-CM | POA: Diagnosis not present

## 2024-03-26 NOTE — Patient Instructions (Signed)
Please stop at the lab.  Please return for 1 year.

## 2024-03-26 NOTE — Progress Notes (Signed)
 Patient ID: [name] [name], female   DOB: 1981/04/22, 43 y.o.   MRN: [id number]   HPI  Margrit Wigley is a 43 y.o.-year-old female, initially referred by her PCP, Vangie Genet, MD, returning for follow-up for Hashimoto's thyroiditis and goiter.  Last visit 1 year and 1 month ago.  Interim history: In 09-08/2020 >> started Wegovy  08/2020.  She lost a total of 40 lbs since started.  However, before last visit, she gained approximately 14 pounds.  She gained 18 pounds since then. She started Zepbound 6 weeks ago (from [place], through an [employer]) >> 4 lb wt loss. She had dysphagia in the fall >> sent to GI >> Es was narrowed >> Es dilation last mo >> resolved. She has joint pains, low energy, SOB.  She is wondering if she is premenopausal.  She has not been having menstrual cycles for the last 2 years while using the drospirenone tablets.  Reviewed history: She was diagnosed with Hashimoto's thyroiditis in 2017.  She did not require thyroid  hormones yet.  We started selenium 200 mcg daily in 05/2019.  She did not feel different on this, but her thyroid  antibodies decreased.  Reviewed her TFTs: Lab Results  Component Value Date   TSH 1.77 11/04/2023   TSH 1.55 08/15/2023   TSH 1.350 02/07/2023   TSH 1.36 07/10/2022   TSH 1.41 01/22/2022   TSH 1.03 07/31/2021   TSH 1.20 01/16/2021   TSH 1.32 06/06/2020   TSH 1.31 12/07/2019   TSH 1.31 05/24/2019   FREET4 0.98 08/15/2023   FREET4 1.47 02/07/2023   FREET4 0.89 01/22/2022   FREET4 0.97 07/31/2021   FREET4 0.89 01/16/2021   FREET4 0.89 06/06/2020   FREET4 0.84 12/07/2019   FREET4 0.69 05/24/2019    TPO antibodies were elevated during her pregnancy and they remained elevated after giving birth: Component     Latest Ref Rng & Units 11/05/2016  Thyroglobulin Ab     <2 IU/mL <1  Thyroperoxidase Ab SerPl-aCnc     <9 IU/mL 139 (H)   Component     Latest Ref Rng & Units 05/24/2019  Thyroperoxidase Ab SerPl-aCnc      <9 IU/mL 42 (H)  Thyroglobulin Ab     < or = 1 IU/mL 1   Component     Latest Ref Rng & Units 12/07/2019  Thyroglobulin Ab     < or = 1 IU/mL <1  Thyroperoxidase Ab SerPl-aCnc     <9 IU/mL 36 (H)   Component     Latest Ref Rng & Units 01/16/2021  Thyroperoxidase Ab SerPl-aCnc     <9 IU/mL 24 (H)   Component     Latest Ref Rng & Units 01/22/2022  Thyroperoxidase Ab SerPl-aCnc     <9 IU/mL 56 (H)  Thyroglobulin Ab     < or = 1 IU/mL <1   Component     Latest Ref Rng 08/15/2023  Thyroperoxidase Ab SerPl-aCnc     <9 IU/mL 80 (H)   Thyroglobulin Ab     < or = 1 IU/mL <1    Thyroid  ultrasound (04/19/2019): Mildly heterogeneous and enlarged thyroid  gland, without nodules Parenchymal Echotexture: Mildly heterogenous Isthmus: 0.4 cm Right lobe: 4.6 x 1.7 x 1.6 cm Left lobe: 4.7 x 1.4 x 1.3 cm  She describes a long history of: - weight gain - gained 20 lbs when moved to [place] for 1 year, then moved back in 06/2018 lost 10 lbs. Lowest weight 160 lb. -Fatigue -Cold intolerance -Depression/anxiety-  on Lexapro .  -Constipation- occasional -Hair loss- occasional. Hair loss increased after Covid19 in 10/2020.  Pt denies: - feeling nodules in neck - hoarseness - dysphagia - choking  She has no family history of thyroid  disease but FH of thyroid  cancer + M aunt  - had hemithyroidectomy.  She has a family history of autoimmune disease in mother (psoriasis).   No recent steroid use.. No history of radiation therapy to head or neck. No iodine supplements or biotin.  Pt. also has a history of cholecystectomy, C-section with her son in 2017.  Therapist: Dr. Annelle Kiel.  ROS: + see HPI I reviewed pt's medications, allergies, PMH, social hx, family hx, and changes were documented in the history of present illness. Otherwise, unchanged from my initial visit note.  Past Medical History:  Diagnosis Date   Abnormal Pap smear 2002   Colpo and Laser HPV   Allergy    Former smoker    GERD  (gastroesophageal reflux disease)    Headache    migraines   Hiatal hernia    HSV-1 (herpes simplex virus 1) infection 02/2010   positive on peri-anal c & s   Hx of migraines 03/07/2016   Obesity    Thyroid  disease    Vaginal Pap smear, abnormal    Past Surgical History:  Procedure Laterality Date   BREAST BIOPSY Right 08/20/2023   MM RT BREAST BX W LOC DEV 1ST LESION IMAGE BX SPEC STEREO GUIDE 08/20/2023 [facility]   CESAREAN SECTION N/A 06/21/2016   Procedure: CESAREAN SECTION;  Surgeon: Marylu Soda, MD;  Location: [facility];  Service: Obstetrics;  Laterality: N/A;   CHOLECYSTECTOMY, LAPAROSCOPIC  11/18/2002   COLPOSCOPY  11/18/2000   HPV, laser   ESOPHAGOGASTRODUODENOSCOPY     TONSILLECTOMY AND ADENOIDECTOMY  11/18/2000   Social History   Socioeconomic History   Marital status: Single    Spouse name: Not on file   Number of children: 1   Years of education: Not on file   Highest education level: Not on file  Occupational History    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[employer]; pharmacist  Tobacco Use   Smoking status: Former Smoker    Quit date: 06/20/2003    Years since quitting: 15.9   Smokeless tobacco: Never Used   Tobacco comment: in college  Substance and Sexual Activity   Alcohol use: No   Drug use: No   Sexual activity: Not Currently    Birth control/protection: None  Lifestyle   Physical activity    Days per week: 2 days    Minutes per session: 30 min   Stress: Only a little   Current Outpatient Medications on File Prior to Visit  Medication Sig Dispense Refill   Cholecalciferol (VITAMIN D ) 50 MCG (2000 UT) tablet Take 5,000 Units by mouth daily.     Drospirenone (SLYND) 4 MG TABS Take by mouth daily.     pantoprazole  (PROTONIX ) 40 MG tablet Take 1 tablet (40 mg total) by mouth daily. 90 tablet 3   phentermine  (ADIPEX-P ) 37.5 MG tablet TAKE 1/2 TO 1 (ONE-HALF TO ONE) TABLET BY MOUTH ONCE DAILY IN THE MORNING FOR  DIETING  AND  WEIGHT  LOSS 30  tablet 0   selenium 50 MCG TABS tablet Take 200 mcg by mouth daily.      topiramate  (TOPAMAX ) 50 MG tablet Take 1 tablet (50 mg total) by mouth 2 (two) times daily. 60 tablet 2   tretinoin  (RETIN-A ) 0.05 % cream APPLY TOPICALLY TO THE  AFFECTED AREA AT BEDTIME 45 g 0   Current Facility-Administered Medications on File Prior to Visit  Medication Dose Route Frequency Provider Last Rate Last Admin   0.9 %  sodium chloride  infusion  500 mL Intravenous Once Nandigam, Kavitha V, MD       Allergies  Allergen Reactions   Other Other (See Comments)    steroid eye drops-causes glaucoma    Family History  Problem Relation Age of Onset   Depression Mother    Suicidality Mother    Thyroid  cancer Maternal Aunt    ALS Maternal Uncle 22   Leukemia Paternal Aunt 84       CLL   ALS Maternal Grandmother    Breast cancer Maternal Grandmother 56   Alzheimer's disease Maternal Grandfather    Prostate cancer Maternal Grandfather    Lung cancer Paternal Grandfather    Colon cancer Neg Hx    Esophageal cancer Neg Hx    Stomach cancer Neg Hx    Rectal cancer Neg Hx    PE: BP 118/70   Pulse 77   Ht 5\' 2"  (1.575 m)   Wt 196 lb 6.4 oz (89.1 kg)   SpO2 99%   BMI 35.92 kg/m  Wt Readings from Last 10 Encounters:  03/26/24 196 lb 6.4 oz (89.1 kg)  01/30/24 195 lb (88.5 kg)  12/25/23 195 lb (88.5 kg)  11/04/23 194 lb 3.2 oz (88.1 kg)  04/30/23 178 lb 9.6 oz (81 kg)  02/07/23 177 lb 12.8 oz (80.6 kg)  10/25/22 176 lb (79.8 kg)  07/10/22 175 lb 12.8 oz (79.7 kg)  03/01/22 165 lb (74.8 kg)  01/22/22 159 lb 9.6 oz (72.4 kg)   Constitutional: overweight, in NAD Eyes:  EOMI, no exophthalmos ENT: no neck masses, no cervical lymphadenopathy Cardiovascular: RRR, No MRG Respiratory: CTA B Musculoskeletal: no deformities Skin:no rashes Neurological: no tremor with outstretched hands  ASSESSMENT: 1. Hashimoto thyroiditis  2. Thyromegaly  PLAN: 1. Hashimoto thyroiditis - Pt with history of  autoimmune thyroiditis, with elevated thyroid  antibodies and also heterogeneous aspect of her thyroid  on ultrasound.  She does not have a personal history of radiation therapy of head or neck.  She has an aunt with thyroid  cancer (unclear type).  We initially had her on selenium in 05/2019 and her antibody levels improved but they remained elevated.  She eventually came off selenium but at last visit TPO antibodies are higher so we discussed about possibly restarting this.  She otherwise did not require other intervention.  TFTs were normal so we did not have to start levothyroxine. -We discussed in the past about measures to improve her immune system including stress reduction, relaxation, diet-discussed the reduction of dairy, gluten, meat, and increasing fruits and vegetables, exercise, sleep - At today's visit, she does have some fatigue, which is chronic for her.  However, she also has low energy, shortness of breath and overall feels poorly.  She did not have menstrual cycles in 2 years while on drospirenone tablets.  We discussed that the symptoms could be related to the fact that she gained 20 pounds since last visit, and we discussed that sometimes this can cause resistance to the TSH action with subsequent mild hypothyroidism.  She now started Zepbound proximately 1.5 months ago and she is titrating this up.  I do believe that losing the extra weight that she gained in the last 2 years would greatly help in increasing her energy level and making her feel better. - We will  recheck her TFTs today and add antibody levels -She is aware about how to take levothyroxine correctly in case we need to start:. every day, with water, at least 30 minutes before breakfast, separated by at least 4 hours from: - acid reflux medications - calcium - iron - multivitamins - RTC in 1 year  2.  Thyromegaly - The thyroid  gland appeared to be slightly enlarged on the most recent thyroid  ultrasound from 04/2019, but  without nodules.  I do not feel a goiter on physical exam. - No neck compression symptoms but she did have dysphagia several months ago which resolved after esophageal stretching. - Her thyroid  enlargement could be related to her h/o Hashimoto's thyroiditis, and we discussed that this may have a fluctuating pattern - No further imaging investigation needed for this  Orders Placed This Encounter  Procedures   TSH   T4, free   T3, free   Thyroglobulin antibody   Thyroid  peroxidase antibody   Office Visit on 03/26/2024  Component Date Value Ref Range Status   TSH 03/26/2024 1.19  mIU/L Final   Comment:           Reference Range .           > or = 20 Years  0.40-4.50 .                Pregnancy Ranges           First trimester    0.26-2.66           Second trimester   0.55-2.73           Third trimester    0.43-2.91    Free T4 03/26/2024 1.5  0.8 - 1.8 ng/dL Final   T3, Free 41/32/4401 3.4  2.3 - 4.2 pg/mL Final  TFTs are normal.  Antibodies are still pending.  Emilie Harden, MD PhD [facility] Endocrinology

## 2024-03-29 ENCOUNTER — Encounter: Payer: Self-pay | Admitting: Internal Medicine

## 2024-03-29 LAB — T3, FREE: T3, Free: 3.4 pg/mL (ref 2.3–4.2)

## 2024-03-29 LAB — TSH: TSH: 1.19 m[IU]/L

## 2024-03-29 LAB — THYROGLOBULIN ANTIBODY: Thyroglobulin Ab: <1 [IU]/mL (ref <=1)

## 2024-03-29 LAB — THYROID PEROXIDASE ANTIBODY: Thyroperoxidase Ab SerPl-aCnc: 88 [IU]/mL — ABNORMAL HIGH (ref <9)

## 2024-03-29 LAB — T4, FREE: Free T4: 1.5 ng/dL (ref 0.8–1.8)

## 2024-03-30 ENCOUNTER — Ambulatory Visit: Payer: Self-pay | Admitting: Internal Medicine

## 2024-04-03 ENCOUNTER — Encounter (HOSPITAL_COMMUNITY): Payer: Self-pay | Admitting: Emergency Medicine

## 2024-04-03 ENCOUNTER — Emergency Department (HOSPITAL_COMMUNITY)
Admission: EM | Admit: 2024-04-03 | Discharge: 2024-04-03 | Disposition: A | Discharge status: Home / Self Care | Attending: Emergency Medicine | Admitting: Emergency Medicine

## 2024-04-03 DIAGNOSIS — T7802XA Anaphylactic reaction due to shellfish (crustaceans), initial encounter: Secondary | ICD-10-CM | POA: Diagnosis present

## 2024-04-03 DIAGNOSIS — T7840XA Allergy, unspecified, initial encounter: Secondary | ICD-10-CM

## 2024-04-03 LAB — COMPREHENSIVE METABOLIC PANEL WITH GFR
ALT: 83 U/L — ABNORMAL HIGH (ref 0–44)
AST: 105 U/L — ABNORMAL HIGH (ref 15–41)
Albumin: 4 g/dL (ref 3.5–5.0)
Alkaline Phosphatase: 85 U/L (ref 38–126)
Anion gap: 10 (ref 5–15)
BUN: 12 mg/dL (ref 6–20)
CO2: 17 mmol/L — ABNORMAL LOW (ref 22–32)
Calcium: 8.9 mg/dL (ref 8.9–10.3)
Chloride: 110 mmol/L (ref 98–111)
Creatinine, Ser: 0.89 mg/dL (ref 0.44–1.00)
GFR, Estimated: >60 mL/min (ref >60)
Glucose, Bld: 98 mg/dL (ref 70–99)
Potassium: 3.5 mmol/L (ref 3.5–5.1)
Sodium: 137 mmol/L (ref 135–145)
Total Bilirubin: 0.4 mg/dL (ref 0.0–1.2)
Total Protein: 7.4 g/dL (ref 6.5–8.1)

## 2024-04-03 LAB — CBC WITH DIFFERENTIAL/PLATELET
Abs Immature Granulocytes: 0.02 10*3/uL (ref 0.00–0.07)
Basophils Absolute: 0 10*3/uL (ref 0.0–0.1)
Basophils Relative: 0 %
Eosinophils Absolute: 0.1 10*3/uL (ref 0.0–0.5)
Eosinophils Relative: 2 %
HCT: 45.9 % (ref 36.0–46.0)
Hemoglobin: 15.4 g/dL — ABNORMAL HIGH (ref 12.0–15.0)
Immature Granulocytes: 0 %
Lymphocytes Relative: 40 %
Lymphs Abs: 3 10*3/uL (ref 0.7–4.0)
MCH: 30.4 pg (ref 26.0–34.0)
MCHC: 33.6 g/dL (ref 30.0–36.0)
MCV: 90.5 fL (ref 80.0–100.0)
Monocytes Absolute: 0.7 10*3/uL (ref 0.1–1.0)
Monocytes Relative: 9 %
Neutro Abs: 3.7 10*3/uL (ref 1.7–7.7)
Neutrophils Relative %: 49 %
Platelets: 361 10*3/uL (ref 150–400)
RBC: 5.07 MIL/uL (ref 3.87–5.11)
RDW: 12.2 % (ref 11.5–15.5)
WBC: 7.5 10*3/uL (ref 4.0–10.5)
nRBC: 0 % (ref 0.0–0.2)

## 2024-04-03 MED ORDER — DEXAMETHASONE SODIUM PHOSPHATE 10 MG/ML IJ SOLN
10.0000 mg | Freq: Once | INTRAMUSCULAR | Status: AC
  Administered 2024-04-03: 10 mg via INTRAVENOUS
  Filled 2024-04-03: qty 1

## 2024-04-03 MED ORDER — METHYLPREDNISOLONE 4 MG PO TBPK: ORAL_TABLET | ORAL | 0 refills | Status: DC

## 2024-04-03 NOTE — Discharge Instructions (Signed)
 I have prescribed a Medrol Dosepak.  Please take as directed.  If you develop any life-threatening symptoms please return to the emergency department.  These would include things such as severe shortness of breath.  Otherwise, please follow-up with your primary care provider for further evaluation as needed.

## 2024-04-03 NOTE — ED Triage Notes (Signed)
 Patient c/o allergic reaction after eating soft shell crab tonight. Patient report worsening hives all over her body. Patient report taking oral benadryl  50mg  and pepcid  without relief. Patient denies Chest pain and SOB. Patient denies N/V.

## 2024-04-03 NOTE — ED Provider Notes (Signed)
 [place] EMERGENCY DEPARTMENT AT [facility] Provider Note   CSN: [id number] Arrival date & time: 04/03/24  0037     History  Chief Complaint  Patient presents with   Allergic Reaction    [name] [name] is a 43 y.o. female.  Patient with past medical history significant for Hashimoto's thyroiditis, GERD presents to the emergency department complaining of allergic reaction.  She states that she was out celebrating her birthday earlier in the evening at a restaurant it was eating social ground.  Upon returning home she noticed hives all over her upper body including bilateral arms, chest, abdomen.  She denies shortness of breath, GI upset.  She states she took 50 mg of Benadryl  and 20 mg of Pepcid  at home without relief.  She denies other systemic symptoms at this time.   Allergic Reaction      Home Medications Prior to Admission medications   Medication Sig Start Date End Date Taking? Authorizing Provider  methylPREDNISolone (MEDROL DOSEPAK) 4 MG TBPK tablet Take as directed per package instructions 04/03/24  Yes Jasia Hiltunen B, PA-C  Cholecalciferol (VITAMIN D ) 50 MCG (2000 UT) tablet Take 5,000 Units by mouth daily.    [provider]  Drospirenone (SLYND) 4 MG TABS Take by mouth daily.    [provider]  pantoprazole  (PROTONIX ) 40 MG tablet Take 1 tablet (40 mg total) by mouth daily. 01/30/24   Nandigam, Kavitha V, MD  phentermine  (ADIPEX-P ) 37.5 MG tablet TAKE 1/2 TO 1 (ONE-HALF TO ONE) TABLET BY MOUTH ONCE DAILY IN THE MORNING FOR  DIETING  AND  WEIGHT  LOSS 12/15/23   Cranford, Tonya, NP  selenium 50 MCG TABS tablet Take 200 mcg by mouth daily.     [provider]  topiramate  (TOPAMAX ) 50 MG tablet Take 1 tablet (50 mg total) by mouth 2 (two) times daily. 11/04/23 11/03/24  Wilkinson, Dana E, FNP  tretinoin  (RETIN-A ) 0.05 % cream APPLY TOPICALLY TO THE AFFECTED AREA AT BEDTIME 06/03/23   Wilkinson, Dana E, FNP      Allergies     Other    Review of Systems   Review of Systems  Physical Exam Updated Vital Signs BP 108/71   Pulse (!) 113   Temp 97.8 F (36.6 C)   Resp 10   Ht 5\' 2"  (1.575 m)   Wt 89.1 kg   SpO2 99%   BMI 35.93 kg/m  Physical Exam Vitals and nursing note reviewed.  Constitutional:      General: She is not in acute distress.    Appearance: She is well-developed.  HENT:     Head: Normocephalic and atraumatic.  Eyes:     Conjunctiva/sclera: Conjunctivae normal.  Cardiovascular:     Rate and Rhythm: Normal rate and regular rhythm.  Pulmonary:     Effort: Pulmonary effort is normal. No respiratory distress.     Breath sounds: Normal breath sounds.  Musculoskeletal:        General: No swelling.     Cervical back: Neck supple.  Skin:    General: Skin is warm and dry.     Capillary Refill: Capillary refill takes less than 2 seconds.     Findings: Rash (Urticaria on abdomen and bilateral arms) present.  Neurological:     Mental Status: She is alert.  Psychiatric:        Mood and Affect: Mood normal.     ED Results / Procedures / Treatments   Labs (all labs ordered are listed,  but only abnormal results are displayed) Labs Reviewed  CBC WITH DIFFERENTIAL/PLATELET - Abnormal; Notable for the following components:      Result Value   Hemoglobin 15.4 (*)    All other components within normal limits  COMPREHENSIVE METABOLIC PANEL WITH GFR - Abnormal; Notable for the following components:   CO2 17 (*)    AST 105 (*)    ALT 83 (*)    All other components within normal limits    EKG None  Radiology No results found.  Procedures Procedures    Medications Ordered in ED Medications  dexamethasone  (DECADRON ) injection 10 mg (10 mg Intravenous Given 04/03/24 0112)    ED Course/ Medical Decision Making/ A&P                                 Medical Decision Making Amount and/or Complexity of Data Reviewed Labs: ordered.  Risk Prescription drug management.   This  patient presents to the ED for concern of urticaria, this involves an extensive number of treatment options, and is a complaint that carries with it a high risk of complications and morbidity.  The differential diagnosis includes allergic reaction, anaphylactic reaction, others   Co morbidities that complicate the patient evaluation  Hashimoto's   Problem List / ED Course / Critical interventions / Medication management   I ordered medication including Decadron  for allergic reaction Reevaluation of the patient after these medicines showed that the patient improved I have reviewed the patients home medicines and have made adjustments as needed   Test / Admission - Considered:  Patient's hives improved completely after Decadron  administration.  This appears to be an allergic reaction.  Unclear if this was due to social crab or some other allergen.  No signs of anaphylaxis.  Plan to discharge home with prescription for Medrol Dosepak.  Return precautions provided.         Final Clinical Impression(s) / ED Diagnoses Final diagnoses:  Allergic reaction, initial encounter    Rx / DC Orders ED Discharge Orders          Ordered    methylPREDNISolone (MEDROL DOSEPAK) 4 MG TBPK tablet        04/03/24 0320              Elisa Guest, PA-C 04/03/24 0335    Lindle Rhea, MD 04/03/24 [phone]

## 2024-05-13 ENCOUNTER — Ambulatory Visit: Admitting: Family Medicine

## 2024-05-13 VITALS — BP 118/74 | HR 94 | Temp 98.3°F | Ht 62.0 in | Wt 189.0 lb

## 2024-05-13 DIAGNOSIS — M791 Myalgia, unspecified site: Secondary | ICD-10-CM

## 2024-05-13 DIAGNOSIS — E669 Obesity, unspecified: Secondary | ICD-10-CM

## 2024-05-13 DIAGNOSIS — M2550 Pain in unspecified joint: Secondary | ICD-10-CM

## 2024-05-13 DIAGNOSIS — R5383 Other fatigue: Secondary | ICD-10-CM

## 2024-05-13 DIAGNOSIS — G43009 Migraine without aura, not intractable, without status migrainosus: Secondary | ICD-10-CM

## 2024-05-13 DIAGNOSIS — Z7689 Persons encountering health services in other specified circumstances: Secondary | ICD-10-CM

## 2024-05-13 LAB — CBC WITH DIFFERENTIAL/PLATELET
Basophils Absolute: 0 10*3/uL (ref 0.0–0.1)
Basophils Relative: 0.7 % (ref 0.0–3.0)
Eosinophils Absolute: 0.2 10*3/uL (ref 0.0–0.7)
Eosinophils Relative: 3.3 % (ref 0.0–5.0)
HCT: 42 % (ref 36.0–46.0)
Hemoglobin: 14.1 g/dL (ref 12.0–15.0)
Lymphocytes Relative: 33.1 % (ref 12.0–46.0)
Lymphs Abs: 1.6 10*3/uL (ref 0.7–4.0)
MCHC: 33.5 g/dL (ref 30.0–36.0)
MCV: 89.2 fl (ref 78.0–100.0)
Monocytes Absolute: 0.4 10*3/uL (ref 0.1–1.0)
Monocytes Relative: 8.6 % (ref 3.0–12.0)
Neutro Abs: 2.7 10*3/uL (ref 1.4–7.7)
Neutrophils Relative %: 54.3 % (ref 43.0–77.0)
Platelets: 293 10*3/uL (ref 150.0–400.0)
RBC: 4.7 Mil/uL (ref 3.87–5.11)
RDW: 13.3 % (ref 11.5–15.5)
WBC: 4.9 10*3/uL (ref 4.0–10.5)

## 2024-05-13 LAB — COMPREHENSIVE METABOLIC PANEL WITH GFR
ALT: 9 U/L (ref 0–35)
AST: 12 U/L (ref 0–37)
Albumin: 4.2 g/dL (ref 3.5–5.2)
Alkaline Phosphatase: 66 U/L (ref 39–117)
BUN: 13 mg/dL (ref 6–23)
CO2: 23 meq/L (ref 19–32)
Calcium: 9.6 mg/dL (ref 8.4–10.5)
Chloride: 109 meq/L (ref 96–112)
Creatinine, Ser: 1.02 mg/dL (ref 0.40–1.20)
GFR: 66.63 mL/min (ref >60.00)
Glucose, Bld: 86 mg/dL (ref 70–99)
Potassium: 4 meq/L (ref 3.5–5.1)
Sodium: 140 meq/L (ref 135–145)
Total Bilirubin: 0.6 mg/dL (ref 0.2–1.2)
Total Protein: 7.3 g/dL (ref 6.0–8.3)

## 2024-05-13 LAB — HEMOGLOBIN A1C: Hgb A1c MFr Bld: 5.3 % (ref 4.6–6.5)

## 2024-05-13 LAB — SEDIMENTATION RATE: Sed Rate: 15 mm/h (ref 0–20)

## 2024-05-13 LAB — LUTEINIZING HORMONE: LH: 1.53 m[IU]/mL

## 2024-05-13 LAB — TESTOSTERONE: Testosterone: 0 ng/dL — ABNORMAL LOW (ref 15.00–40.00)

## 2024-05-13 LAB — VITAMIN B12: Vitamin B-12: 246 pg/mL (ref 211–911)

## 2024-05-13 LAB — VITAMIN D 25 HYDROXY (VIT D DEFICIENCY, FRACTURES): VITD: 59.22 ng/mL (ref 30.00–100.00)

## 2024-05-13 LAB — FOLLICLE STIMULATING HORMONE: FSH: 4.7 m[IU]/mL

## 2024-05-13 MED ORDER — TOPIRAMATE 50 MG PO TABS: 50.0000 mg | ORAL_TABLET | Freq: Every day | ORAL | 3 refills | Status: DC

## 2024-05-13 MED ORDER — TOPIRAMATE 50 MG PO TABS: 50.0000 mg | ORAL_TABLET | Freq: Every day | ORAL | Status: DC

## 2024-05-13 NOTE — Progress Notes (Signed)
 New Patient Office Visit  Subjective   Patient ID: [name] [name], female    DOB: 05/09/1981  Age: 43 y.o. MRN: [id number]  CC:  Chief Complaint  Patient presents with   Establish Care    HPI [name] [name] presents to establish care with new provider.  Patients previous primary care provider: [facility] with Elsie Richards.   Specialist:[facility]  Norleen Shona Raddle. Dermatology  [facility]  Migraines: Chronic. Patient is taking Topiramate  50mg  daily, initally was prescribed BID for weight loss. Prior to giving birth years ago, she took medication for migraines at a higher dose   All other medications managed by specialist.   Patient is complaining of body aches, constant hip & knee joint pain. Pain started 6 months ago. She reports she is fatigued too. She reports she spoke to her endocrinologist who thought this could be related to gaining weight. However, patient reports she has lost 10Ibs, but pain is worse.  Outpatient Encounter Medications as of 05/13/2024  Medication Sig   Cholecalciferol (VITAMIN D ) 50 MCG (2000 UT) tablet Take 5,000 Units by mouth daily.   Drospirenone (SLYND) 4 MG TABS Take by mouth daily.   pantoprazole  (PROTONIX ) 40 MG tablet Take 1 tablet (40 mg total) by mouth daily.   selenium 50 MCG TABS tablet Take 200 mcg by mouth daily.    tirzepatide (ZEPBOUND) 7.5 MG/0.5ML Pen Inject 7.5 mg into the skin once a week.   tretinoin  (RETIN-A ) 0.05 % cream APPLY TOPICALLY TO THE AFFECTED AREA AT BEDTIME   [DISCONTINUED] topiramate  (TOPAMAX ) 50 MG tablet Take 1 tablet (50 mg total) by mouth 2 (two) times daily.   topiramate  (TOPAMAX ) 50 MG tablet Take 1 tablet (50 mg total) by mouth daily.   [DISCONTINUED] methylPREDNISolone  (MEDROL  DOSEPAK) 4 MG TBPK tablet Take as directed per package instructions   [DISCONTINUED] phentermine  (ADIPEX-P )  37.5 MG tablet TAKE 1/2 TO 1 (ONE-HALF TO ONE) TABLET BY MOUTH ONCE DAILY IN THE MORNING FOR  DIETING  AND  WEIGHT  LOSS   [DISCONTINUED] topiramate  (TOPAMAX ) 50 MG tablet Take 1 tablet (50 mg total) by mouth daily.   [DISCONTINUED] 0.9 %  sodium chloride  infusion    No facility-administered encounter medications on file as of 05/13/2024.    Past Medical History:  Diagnosis Date   Abnormal Pap smear 2002   Colpo and Laser HPV   Allergy    Depression    Former smoker    GERD (gastroesophageal reflux disease)    Headache    migraines   Hiatal hernia    HSV-1 (herpes simplex virus 1) infection 02/2010   positive on peri-anal c & s   Hx of migraines 03/07/2016   Obesity    Thyroid  disease    Vaginal Pap smear, abnormal     Past Surgical History:  Procedure Laterality Date   BREAST BIOPSY Right 08/20/2023   MM RT BREAST BX W LOC DEV 1ST LESION IMAGE BX SPEC STEREO GUIDE 08/20/2023 [facility] MAMMOGRAPHY   CESAREAN SECTION N/A 06/21/2016   Procedure: CESAREAN SECTION;  Surgeon: Ovid All, MD;  [facility]: [facility];  Service: Obstetrics;  Laterality: N/A;   CHOLECYSTECTOMY  2004   CHOLECYSTECTOMY, LAPAROSCOPIC  11/18/2002   COLPOSCOPY  11/18/2000   HPV, laser   ESOPHAGOGASTRODUODENOSCOPY     TONSILLECTOMY AND ADENOIDECTOMY  11/18/2000    Family History  Problem Relation Age of Onset   Depression Mother  Suicidality Mother    Alcohol abuse Mother    Thyroid  cancer Maternal Aunt    Cancer Maternal Aunt        CLL   ALS Maternal Uncle 56   Leukemia Paternal Aunt 36       CLL   ALS Maternal Grandmother    Breast cancer Maternal Grandmother 61   Cancer Maternal Grandmother    Alzheimer's disease Maternal Grandfather    Prostate cancer Maternal Grandfather    Cancer Maternal Grandfather    Lung cancer Paternal Grandfather    Colon cancer Neg Hx    Esophageal cancer Neg Hx    Stomach cancer Neg Hx    Rectal cancer Neg Hx     Social History   Socioeconomic  History   Marital status: Single    Spouse name: Not on file   Number of children: 1   Years of education: Not on file   Highest education level: Doctorate  Occupational History    Employer: [employer]  Tobacco Use   Smoking status: Former    Current packs/day: 0.00    Average packs/day: 0.1 packs/day for 5.0 years (0.6 ttl pk-yrs)    Types: Cigarettes    Start date: 06/19/1998    Quit date: 06/20/2003    Years since quitting: 20.9   Smokeless tobacco: Never   Tobacco comments:    in college  Vaping Use   Vaping status: Never Used  Substance and Sexual Activity   Alcohol use: Yes    Comment: Once a month   Drug use: No   Sexual activity: Yes    Partners: Male    Birth control/protection: Condom, Pill  Other Topics Concern   Not on file  Social History Narrative   Not on file   Social Drivers of Health   Financial Resource Strain: Low Risk  (05/13/2024)   Overall Financial Resource Strain (CARDIA)    Difficulty of Paying Living Expenses: Not hard at all  Food Insecurity: No Food Insecurity (05/13/2024)   Hunger Vital Sign    Worried About Running Out of Food in the Last Year: Never true    Ran Out of Food in the Last Year: Never true  Transportation Needs: No Transportation Needs (05/13/2024)   PRAPARE - Administrator, Civil Service (Medical): No    Lack of Transportation (Non-Medical): No  Physical Activity: Insufficiently Active (05/13/2024)   Exercise Vital Sign    Days of Exercise per Week: 2 days    Minutes of Exercise per Session: 20 min  Stress: Stress Concern Present (05/13/2024)   [employer] of Occupational Health - Occupational Stress Questionnaire    Feeling of Stress: To some extent  Social Connections: Moderately Isolated (05/13/2024)   Social Connection and Isolation Panel    Frequency of Communication with Friends and Family: Three times a week    Frequency of Social Gatherings with Friends and Family: Three times a week    Attends  Religious Services: Never    Active Member of Clubs or Organizations: No    Attends Banker Meetings: Not on file    Marital Status: Living with partner  Intimate Partner Violence: Not At Risk (05/13/2024)   Humiliation, Afraid, Rape, and Kick questionnaire    Fear of Current or Ex-Partner: No    Emotionally Abused: No    Physically Abused: No    Sexually Abused: No    ROS See HPI above    Objective  BP 118/74  Pulse 94   Temp 98.3 F (36.8 C) (Oral)   Ht 5' 2 (1.575 m)   Wt 189 lb (85.7 kg)   LMP  (LMP Unknown)   SpO2 98%   BMI 34.57 kg/m   Physical Exam Vitals reviewed.  Constitutional:      General: She is not in acute distress.    Appearance: Normal appearance. She is obese. She is not ill-appearing, toxic-appearing or diaphoretic.  HENT:     Head: Normocephalic and atraumatic.   Eyes:     General:        Right eye: No discharge.        Left eye: No discharge.     Conjunctiva/sclera: Conjunctivae normal.    Cardiovascular:     Rate and Rhythm: Normal rate and regular rhythm.     Heart sounds: Normal heart sounds. No murmur heard.    No friction rub. No gallop.  Pulmonary:     Effort: Pulmonary effort is normal. No respiratory distress.     Breath sounds: Normal breath sounds.   Musculoskeletal:        General: Normal range of motion.   Skin:    General: Skin is warm and dry.   Neurological:     General: No focal deficit present.     Mental Status: She is alert and oriented to person, place, and time. Mental status is at baseline.     Gait: Gait normal.   Psychiatric:        Mood and Affect: Mood normal.        Behavior: Behavior normal.        Thought Content: Thought content normal.        Judgment: Judgment normal.      Assessment & Plan:  Arthralgia, unspecified joint -     CBC with Differential/Platelet -     Comprehensive metabolic panel with GFR -     Sedimentation rate -     Rheumatoid factor -     Cyclic citrul  peptide antibody, IgG -     ANA  Muscle pain -     CBC with Differential/Platelet -     Comprehensive metabolic panel with GFR -     Cyclic citrul peptide antibody, IgG -     ANA  Fatigue, unspecified type -     CBC with Differential/Platelet -     Comprehensive metabolic panel with GFR -     VITAMIN D  25 Hydroxy (Vit-D Deficiency, Fractures) -     Vitamin B12 -     Testosterone -     Follicle stimulating hormone -     Luteinizing hormone -     Estrogens, total  Obesity (BMI 35.0-39.9 without comorbidity) -     CBC with Differential/Platelet -     Comprehensive metabolic panel with GFR -     Hemoglobin A1c -     Topiramate ; Take 1 tablet (50 mg total) by mouth daily.  Dispense: 90 tablet; Refill: 3  Migraine without aura and without status migrainosus, not intractable -     Topiramate ; Take 1 tablet (50 mg total) by mouth daily.  Dispense: 90 tablet; Refill: 3  Encounter to establish care   1.Review health maintenance:  -Cervical cancer screening: [place]; last year  -Colonoscopy: Going to schedule with [facility]  -Covid booster: Declines  -Hep B vaccines: Had completed, unknown when  -Mammogram: Next screening in August 2025  2.Continue with Topiramate  for weight loss and migraines.  Refilled medication.  3.Ordered labs for joint and muscle pain; based on BMI-obesity; and fatigue. Office will call with lab results and will be available on MyChart.   Return in about 6 months (around 11/12/2024) for chronic management.   Camylle Whicker, NP

## 2024-05-13 NOTE — Patient Instructions (Addendum)
-  It was nice to meet you and look forward to taking care of you. -Continue with Topiramate  for weight loss and migraines. Refilled medication.  -Ordered labs for joint and muscle pain; based on BMI-obesity; and fatigue. Office will call with lab results and will be available on MyChart.  -Follow up in 6 months for chronic management.

## 2024-05-17 ENCOUNTER — Ambulatory Visit: Payer: Self-pay | Admitting: Family Medicine

## 2024-05-17 DIAGNOSIS — G43009 Migraine without aura, not intractable, without status migrainosus: Secondary | ICD-10-CM

## 2024-05-17 DIAGNOSIS — E669 Obesity, unspecified: Secondary | ICD-10-CM

## 2024-05-17 MED ORDER — TOPIRAMATE 50 MG PO TABS: 50.0000 mg | ORAL_TABLET | Freq: Every day | ORAL | 2 refills | Status: DC

## 2024-05-18 LAB — CYCLIC CITRUL PEPTIDE ANTIBODY, IGG: Cyclic Citrullin Peptide Ab: <16 U

## 2024-05-18 LAB — ANA: Anti Nuclear Antibody (ANA): NEGATIVE

## 2024-05-18 LAB — ESTROGENS, TOTAL: Estrogen: 110 pg/mL

## 2024-05-18 LAB — RHEUMATOID FACTOR: Rheumatoid fact SerPl-aCnc: <10 [IU]/mL (ref <14)

## 2024-05-19 ENCOUNTER — Encounter: Payer: Self-pay | Admitting: Internal Medicine

## 2024-05-26 ENCOUNTER — Ambulatory Visit: Admitting: Family Medicine

## 2024-07-02 ENCOUNTER — Encounter: Payer: Self-pay | Admitting: Family Medicine

## 2024-07-02 DIAGNOSIS — E079 Disorder of thyroid, unspecified: Secondary | ICD-10-CM

## 2024-07-02 DIAGNOSIS — M2550 Pain in unspecified joint: Secondary | ICD-10-CM

## 2024-07-02 DIAGNOSIS — M791 Myalgia, unspecified site: Secondary | ICD-10-CM

## 2024-07-08 ENCOUNTER — Ambulatory Visit: Admitting: Internal Medicine

## 2024-07-12 ENCOUNTER — Telehealth: Payer: Self-pay

## 2024-07-12 NOTE — Telephone Encounter (Signed)
 Copied from CRM #[id number]. Topic: General - Other >> Jul 12, 2024  4:03 PM Thersia BROCKS wrote: Reason for CRM: Patient called in stated the endocrinology office called stated they have received the labs and the referral but need the office notes as well please fax   [phone]

## 2024-07-13 ENCOUNTER — Ambulatory Visit (INDEPENDENT_AMBULATORY_CARE_PROVIDER_SITE_OTHER): Admitting: Physician Assistant

## 2024-07-13 ENCOUNTER — Other Ambulatory Visit: Payer: Self-pay

## 2024-07-13 ENCOUNTER — Encounter: Payer: Self-pay | Admitting: Physician Assistant

## 2024-07-13 ENCOUNTER — Telehealth: Payer: Self-pay | Admitting: Family Medicine

## 2024-07-13 DIAGNOSIS — M79605 Pain in left leg: Secondary | ICD-10-CM | POA: Diagnosis not present

## 2024-07-13 DIAGNOSIS — M79604 Pain in right leg: Secondary | ICD-10-CM | POA: Diagnosis not present

## 2024-07-13 NOTE — Progress Notes (Signed)
 Office Visit Note   Patient: [name] [name]           Date of Birth: 1981-06-14           MRN: [id number] Visit Date: 07/13/2024              Requested by: Billy Philippe SAUNDERS, NP [address] [place],  [place] [address] PCP: Billy Philippe SAUNDERS, NP  Chief Complaint  Patient presents with   Right Leg - Pain   Left Leg - Pain      HPI: 43 y/o female with chronic aches and pains sometimes in the low back, hips and upper thighs.  She denies trauma or recent surgeries.  She states she has had a diagnosis of Hashimoto and feels this along with perimenopausal changes may be contributing to her symptoms.  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  She recently got remarried and has a 79 year old son.    Of note she has had periods of weight gain and was managing it on Wegovy .  This was no longer covered by insurance so she had to stop.  Recently she has ordered compounding GLP 1 medications on line and is using it every other week.    She tries to stay active, but is having a hard time staying on a routine.  She tries to include her faily in some walks with the dog.    Assessment & Plan: Visit Diagnoses:  1. Bilateral leg pain     Plan: She will start a simple walking program on 07/14/24 either timed or distance for at least 3 days a week.  Then week 2 she will increase her time/distance.   I also suggested she do simple fascia& lymph-activating moves.  I shared a video with her.  I asked her if she could stop the GLP 1 while she is starting an exercise program.    She may need to join a gym with instructors to help with strengthening exercises in the future.  Follow-Up Instructions: Return in about 3 weeks (around 08/03/2024).   Ortho Exam  Patient is alert, oriented, no adenopathy, well-dressed, normal affect, normal respiratory effort. She is ambulatory without antalgic gait.  She has stiffness and achy joints in the hips and knees after being seated for a  while.  No ischemic or brawny skin changes.  No LE edema.  Internal hip rotation intact due to her ability to cross her legs in a seated position.    Tearful when talking about her aches and pains.  She is concerned that she has a medical reason for her body aches and pains, but does not have abnormal labs.      Imaging: Lumbar x rays  No malalignment, minimal lumbar spondylosis,  Stable minimal 2 mm retrolisthesis at L2-3.  No change from x ray from 2018. Hip joints without narrowing.     Labs: Lab Results  Component Value Date   HGBA1C 5.3 05/13/2024   HGBA1C 5.5 11/04/2023   HGBA1C 5.2 10/25/2022   ESRSEDRATE 15 05/13/2024   REPTSTATUS 05/11/2017 FINAL 05/08/2017   CULT  05/08/2017    NO GROUP A STREP (S.PYOGENES) ISOLATED Performed at [facility], [address] [place], [place] [address]    LABORGA NO GROWTH 04/03/2015     Lab Results  Component Value Date   ALBUMIN 4.2 05/13/2024   ALBUMIN 4.0 04/03/2024   ALBUMIN 4.0 11/05/2016    Lab Results  Component Value Date   MG 2.0 11/04/2023   MG 2.2 10/25/2022   MG 2.1 10/23/2021   Lab Results  Component Value Date   VD25OH 59.22 05/13/2024   VD25OH 44 11/04/2023   VD25OH 37 10/25/2022    No results found for: PREALBUMIN    Latest Ref Rng & Units 05/13/2024   12:16 PM 04/03/2024   12:56 AM 11/04/2023    3:46 PM  CBC EXTENDED  WBC 4.0 - 10.5 K/uL 4.9  7.5  7.3   RBC 3.87 - 5.11 Mil/uL 4.70  5.07  4.39   Hemoglobin 12.0 - 15.0 g/dL 85.8  84.5  86.5   HCT 36.0 - 46.0 % 42.0  45.9  40.9   Platelets 150.0 - 400.0 K/uL 293.0  361  327   NEUT# 1.4 - 7.7 K/uL 2.7  3.7  4,234   Lymph# 0.7 - 4.0 K/uL 1.6  3.0       There is no height or weight on file to calculate BMI.  Orders:  Orders Placed This Encounter  Procedures   XR Lumbar Spine 2-3 Views   No orders of the defined types were placed in this encounter.    Procedures: No procedures performed  Clinical Data: No additional  findings.  ROS:  All other systems negative, except as noted in the HPI. Review of Systems  Objective: Vital Signs: There were no vitals taken for this visit.  Specialty Comments:  No specialty comments available.  PMFS History: Patient Active Problem List   Diagnosis Date Noted   GERD (gastroesophageal reflux disease) 12/25/2023   Thyromegaly 04/23/2019   Obesity (BMI 35.0-39.9 without comorbidity) 02/03/2019   Vitamin D  deficiency 08/04/2018   Hashimoto's thyroiditis 11/06/2016   Situational stress 11/24/2014   History of HSV-1 infection 02/16/2010   Past Medical History:  Diagnosis Date   Abnormal Pap smear 2002   Colpo and Laser HPV   Allergy    Depression    Former smoker    GERD (gastroesophageal reflux disease)    Headache    migraines   Hiatal hernia    HSV-1 (herpes simplex virus 1) infection 02/2010   positive on peri-anal c & s   Hx of migraines 03/07/2016   Obesity    Thyroid  disease    Vaginal Pap smear, abnormal     Family History  Problem Relation Age of Onset   Depression Mother    Suicidality Mother    Alcohol abuse Mother    Thyroid  cancer Maternal Aunt    Cancer Maternal Aunt        CLL   ALS Maternal Uncle 41   Leukemia Paternal Aunt 30       CLL   ALS Maternal Grandmother    Breast cancer Maternal Grandmother 26   Cancer Maternal Grandmother    Alzheimer's disease Maternal Grandfather    Prostate cancer Maternal Grandfather    Cancer Maternal Grandfather    Lung cancer Paternal Grandfather    Colon cancer Neg Hx    Esophageal cancer Neg Hx    Stomach cancer Neg Hx    Rectal cancer Neg Hx     Past Surgical History:  Procedure Laterality Date   BREAST BIOPSY Right 08/20/2023   MM RT BREAST BX W LOC DEV 1ST LESION IMAGE BX SPEC STEREO GUIDE 08/20/2023 [facility]MOGR[facility]   CESAREAN SECTION N/A 06/21/2016   Procedure: CESAREAN SECTION;  Surgeon: Ovid All, MD;  [facility];  Service: Obstetrics;  Laterality:  N/A;   CHOLECYSTECTOMY  2004   CHOLECYSTECTOMY, LAPAROSCOPIC  11/18/2002   COLPOSCOPY  11/18/2000   HPV, laser   ESOPHAGOGASTRODUODENOSCOPY     TONSILLECTOMY AND ADENOIDECTOMY  11/18/2000   Social History   Occupational History    Employer: [employer]  Tobacco Use   Smoking status: Former    Current packs/day: 0.00    Average packs/day: 0.1 packs/day for 5.0 years (0.6 ttl pk-yrs)    Types: Cigarettes    Start date: 06/19/1998    Quit date: 06/20/2003    Years since quitting: 21.0   Smokeless tobacco: Never   Tobacco comments:    in college  Vaping Use   Vaping status: Never Used  Substance and Sexual Activity   Alcohol use: Yes    Comment: Once a month   Drug use: No   Sexual activity: Yes    Partners: Male    Birth control/protection: Condom, Pill

## 2024-07-13 NOTE — Telephone Encounter (Signed)
 Copied from CRM [id number]. Topic: Referral - Status >> Jul 13, 2024  3:17 PM Drema MATSU wrote: Reason for CRM: Alana recieved a referral for patient but only recieved lab notes and she wants to know if her last couple of office notes can be sent. Fax: [phone]

## 2024-07-15 ENCOUNTER — Telehealth: Payer: Self-pay | Admitting: *Deleted

## 2024-07-15 NOTE — Telephone Encounter (Signed)
 Copied from CRM [id number]. Topic: Referral - Question >> Jul 15, 2024 10:03 AM Sophia H wrote: Reason for CRM: Spoke with Alana who received a referral for the patient and is needing the clinic to send over the last few office visit notes for the patient. Referral was to endocrinology - [phone]

## 2024-07-21 NOTE — Telephone Encounter (Signed)
 [name] [name] is waiting for office notes etc

## 2024-07-30 ENCOUNTER — Ambulatory Visit: Admitting: Gastroenterology

## 2024-07-30 ENCOUNTER — Encounter: Payer: Self-pay | Admitting: Gastroenterology

## 2024-07-30 VITALS — BP 108/72 | HR 74 | Ht 62.0 in | Wt 183.2 lb

## 2024-07-30 DIAGNOSIS — R1319 Other dysphagia: Secondary | ICD-10-CM

## 2024-07-30 DIAGNOSIS — K219 Gastro-esophageal reflux disease without esophagitis: Secondary | ICD-10-CM | POA: Diagnosis not present

## 2024-07-30 DIAGNOSIS — Z1211 Encounter for screening for malignant neoplasm of colon: Secondary | ICD-10-CM

## 2024-07-30 NOTE — Progress Notes (Signed)
 [name] [name] [id number] 1981/01/01   Chief Complaint: GERD  Referring Provider: Billy Philippe SAUNDERS, NP Primary GI MD: Dr. 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    Last seen in office 12/25/2023 by Cathryne May, NP for complaint of intermittent esophageal dysphagia.  Noted to have history of GERD with hiatal hernia diagnosed in her 65s, with onset of esophageal dysphagia October/November.  Was on PPI therapy as needed.  Had gained weight being off Wegovy .  She underwent EGD 01/30/2024 with finding of a benign-appearing esophageal stenosis which was dilated, 5 cm hiatal hernia, otherwise normal.  Labs 04/2024 with normal CBC and CMP.  Paternal grandfather with history of colon cancer. Due for colonoscopy 03/2024.   Patient states that since she had upper endoscopy in March, she has been having intermittent, severe acid reflux which occurs at night.  This is only happening 1-2 times a month, but states she has not experienced such severe reflux like this before.  Wakes her up in the middle of the night and states she has a lot of acid in her throat which taste like vomit, throat burning, and can be so uncomfortable that she is in tears.  Has tried taking Tums for relief, drinking something cold, eating ice cream, but is unable to relieve symptoms immediately.  States that she is taking Protonix  40 mg daily, and when this occurs will also take an additional dose of Protonix , but does not start working for a couple hours.  She has tried to identify what foods might be causing this.  States it happened after she had eaten [place] food the day before, happened again after she had been at a concert and was drinking a nonalcoholic raspberry drink.  Overall, has been difficult to pinpoint what might be  triggering her symptoms.  After a couple hours, once she has relief of burning, she has no residual symptoms the following day.  States that her difficulty swallowing is completely resolved since having her esophagus dilated.  Denies any nausea or vomiting.  Denies painful swallowing the next day.  She did start Zepbound in April.  States that initially she was having terrible sulfur burps when starting the medication.  She is doing this about every 1.5 to 2 weeks.  It has been helping with weight loss and she has lost about 13 pounds since May.  She is aware of possibility of GI side effects of the medication, but as she is only having breakthrough reflux 1-2 times a month she would like to continue Zepbound as it has been very helpful for her weight loss.  She is having regular bowel movements and denies any issues from a lower GI standpoint.  Denies any blood in her stool or melena.  Previous GI Procedures/Imaging   EGD 01/30/2024 - Benign- appearing esophageal stenosis. Dilated.  - 5 cm hiatal hernia.  - Normal stomach.  - Normal examined duodenum.  - No specimens collected.   Past Medical History:  Diagnosis Date   Abnormal Pap smear 2002   Colpo and Laser HPV   Allergy    Depression    Former smoker    GERD (gastroesophageal reflux disease)    Headache    migraines   Hiatal hernia    HSV-1 (herpes simplex virus 1) infection 02/2010   positive on peri-anal c & s  Hx of migraines 03/07/2016   Obesity    Thyroid  disease    Vaginal Pap smear, abnormal     Past Surgical History:  Procedure Laterality Date   BREAST BIOPSY Right 08/20/2023   MM RT BREAST BX W LOC DEV 1ST LESION IMAGE BX SPEC STEREO GUIDE 08/20/2023 [facility]   CESAREAN SECTION N/A 06/21/2016   Procedure: CESAREAN SECTION;  Surgeon: Ovid All, MD;  [facility];  Service: Obstetrics;  Laterality: N/A;   CHOLECYSTECTOMY  2004   CHOLECYSTECTOMY, LAPAROSCOPIC  11/18/2002    COLPOSCOPY  11/18/2000   HPV, laser   ESOPHAGOGASTRODUODENOSCOPY     TONSILLECTOMY AND ADENOIDECTOMY  11/18/2000    Current Outpatient Medications  Medication Sig Dispense Refill   Cholecalciferol (VITAMIN D ) 50 MCG (2000 UT) tablet Take 5,000 Units by mouth daily.     Drospirenone (SLYND) 4 MG TABS Take by mouth daily.     fluticasone (FLONASE) 50 MCG/ACT nasal spray 1 spray in each nostril Nasally Once a day; Duration: 30 days     metroNIDAZOLE  (METROGEL ) 0.75 % gel Apply topically.     pantoprazole  (PROTONIX ) 40 MG tablet Take 1 tablet (40 mg total) by mouth daily. 90 tablet 3   selenium 50 MCG TABS tablet Take 200 mcg by mouth daily.      tirzepatide (ZEPBOUND) 7.5 MG/0.5ML Pen Inject 7.5 mg into the skin once a week.     topiramate  (TOPAMAX ) 50 MG tablet Take 1 tablet (50 mg total) by mouth daily. 30 tablet 2   tretinoin  (RETIN-A ) 0.05 % cream APPLY TOPICALLY TO THE AFFECTED AREA AT BEDTIME 45 g 0   EPINEPHrine 0.3 mg/0.3 mL IJ SOAJ injection Inject 0.3 mg into the muscle as needed for anaphylaxis.     No current facility-administered medications for this visit.    Allergies as of 07/30/2024 - Review Complete 07/30/2024  Allergen Reaction Noted   Other Other (See Comments) 03/07/2016   Tobramycin-dexamethasone  Other (See Comments) 07/30/2024    Family History  Problem Relation Age of Onset   Depression Mother    Suicidality Mother    Alcohol abuse Mother    Thyroid  cancer Maternal Aunt    Cancer Maternal Aunt        CLL   ALS Maternal Uncle 43   Leukemia Paternal Aunt 7       CLL   ALS Maternal Grandmother    Breast cancer Maternal Grandmother 55   Cancer Maternal Grandmother    Alzheimer's disease Maternal Grandfather    Prostate cancer Maternal Grandfather    Cancer Maternal Grandfather    Lung cancer Paternal Grandfather    Colon cancer Neg Hx    Esophageal cancer Neg Hx    Stomach cancer Neg Hx    Rectal cancer Neg Hx     Social History   Tobacco Use    Smoking status: Former    Current packs/day: 0.00    Average packs/day: 0.1 packs/day for 5.0 years (0.6 ttl pk-yrs)    Types: Cigarettes    Start date: 06/19/1998    Quit date: 06/20/2003    Years since quitting: 21.1   Smokeless tobacco: Never   Tobacco comments:    in college  Vaping Use   Vaping status: Never Used  Substance Use Topics   Alcohol use: Yes    Comment: Once a month   Drug use: No     Review of Systems:    Constitutional: No unintentional weight loss, fever, chills Cardiovascular: No  chest pain Respiratory: No SOB  Gastrointestinal: See HPI and otherwise negative Hematologic: No bleeding    Physical Exam:  Vital signs: BP 108/72   Pulse 74   Ht 5' 2 (1.575 m)   Wt 183 lb 4 oz (83.1 kg)   BMI 33.52 kg/m   Wt Readings from Last 3 Encounters:  07/30/24 183 lb 4 oz (83.1 kg)  05/13/24 189 lb (85.7 kg)  04/03/24 196 lb 6.9 oz (89.1 kg)   Constitutional: Pleasant, overweight female in NAD, alert and cooperative Head:  Normocephalic and atraumatic.  Eyes: No scleral icterus.  Respiratory: Respirations even and unlabored. Lungs clear to auscultation bilaterally.  No wheezes, crackles, or rhonchi.  Cardiovascular:  Regular rate and rhythm. No murmurs. No peripheral edema. Gastrointestinal:  Soft, nondistended, nontender. No rebound or guarding. Normal bowel sounds. No appreciable masses or hepatomegaly. Rectal:  Not performed.  Neurologic:  Alert and oriented x4;  grossly normal neurologically.  Skin:   Dry and intact without significant lesions or rashes. Psychiatric: Oriented to person, place and time. Demonstrates good judgement and reason without abnormal affect or behaviors.   RELEVANT LABS AND IMAGING: CBC    Component Value Date/Time   WBC 4.9 05/13/2024 1216   RBC 4.70 05/13/2024 1216   HGB 14.1 05/13/2024 1216   HGB 14.5 11/24/2014 1609   HCT 42.0 05/13/2024 1216   PLT 293.0 05/13/2024 1216   MCV 89.2 05/13/2024 1216   MCV 97.9 (A)  09/02/2012 1614   MCH 30.4 04/03/2024 0056   MCHC 33.5 05/13/2024 1216   RDW 13.3 05/13/2024 1216   LYMPHSABS 1.6 05/13/2024 1216   MONOABS 0.4 05/13/2024 1216   EOSABS 0.2 05/13/2024 1216   BASOSABS 0.0 05/13/2024 1216    CMP     Component Value Date/Time   NA 140 05/13/2024 1216   K 4.0 05/13/2024 1216   CL 109 05/13/2024 1216   CO2 23 05/13/2024 1216   GLUCOSE 86 05/13/2024 1216   BUN 13 05/13/2024 1216   CREATININE 1.02 05/13/2024 1216   CREATININE 0.72 11/04/2023 1546   CALCIUM 9.6 05/13/2024 1216   PROT 7.3 05/13/2024 1216   ALBUMIN 4.2 05/13/2024 1216   AST 12 05/13/2024 1216   ALT 9 05/13/2024 1216   ALKPHOS 66 05/13/2024 1216   BILITOT 0.6 05/13/2024 1216   GFRNONAA >60 04/03/2024 0056   GFRNONAA 92 08/23/2020 1555   GFRAA 106 08/23/2020 1555     Assessment/Plan:   GERD Esophageal dysphagia Patient seen today for complaint of GERD which is new since endoscopy in March.  Had EGD 01/30/2024 with finding of a benign-appearing esophageal stenosis which was dilated.  She has a 5 cm hiatal hernia which she has been aware of since her 85s.  Over the last several months has been having severe acid reflux which occurs at night, about 1-2 times a month.  Has sensation of acid that taste like vomit in her throat and results in throat burning that can be so severe she is in tears.  Has tried Tums for immediate relief without improvement.  Ultimately ends up taking an additional dose of Protonix  which starts to relieve symptoms in a couple hours.  Has no residual effects the next day.  Has been taking Protonix  40 mg daily as prescribed.  Has been trying to identify food triggers, possibly triggered by acidic drinks and [place] food but she is unsure if there is any clear pattern. Notably, she did start Zepbound in April and I do question  whether this may be worsening symptoms of reflux, as she was not having any issues prior to this.  Since her symptoms are infrequent she would like  to continue Zepbound.  - Continue Protonix  40 mg daily, and increase to twice daily as needed - Suggest trial of OTC Gaviscon for immediate relief of breakthrough reflux and burning throat discomfort - Lifestyle modifications for GERD including elevating head of bed at night and avoiding eating close to bedtime - Consider possible side effect of Zepbound - If symptoms worsen or become more frequent, consider twice daily PPI, possibly repeat EGD - Follow-up 3 months  Screening for colon cancer Patient due for initial screening colonoscopy as she has recently turned 43.  She declines to schedule today but will take a look at her calendar and plans to call the office back to schedule directly.   Camie Furbish, PA-C [facility] 07/30/2024, 8:34 AM  Patient Care Team: Billy Philippe SAUNDERS, NP as PCP - General (Family Medicine) Livingston Rigg, MD as Consulting Physician (Dermatology) Trixie File, MD as Consulting Physician (Internal Medicine)

## 2024-07-30 NOTE — Patient Instructions (Signed)
 Continue Protonix  40 mg daily  Lifestyle modifications for GERD attachment   Try a liquid antacid like Gaviscon for breakthrough reflux as needed  Call the office to schedule a direct recall colonoscopy  _______________________________________________________  If your blood pressure at your visit was 140/90 or greater, please contact your primary care physician to follow up on this.  _______________________________________________________  If you are age 20 or older, your body mass index should be between 23-30. Your Body mass index is 33.52 kg/m. If this is out of the aforementioned range listed, please consider follow up with your Primary Care Provider.  If you are age 21 or younger, your body mass index should be between 19-25. Your Body mass index is 33.52 kg/m. If this is out of the aformentioned range listed, please consider follow up with your Primary Care Provider.   ________________________________________________________  The [facility] providers would like to encourage you to use MYCHART to communicate with providers for non-urgent requests or questions.  Due to long hold times on the telephone, sending your provider a message by [facility] may be a faster and more efficient way to get a response.  Please allow 48 business hours for a response.  Please remember that this is for non-urgent requests.  _______________________________________________________  [facility] is using a team-based approach to care.  Your team is made up of your doctor and two to three APPS. Our APPS (Nurse Practitioners and Physician Assistants) work with your physician to ensure care continuity for you. They are fully qualified to address your health concerns and develop a treatment plan. They communicate directly with your gastroenterologist to care for you. Seeing the Advanced Practice Practitioners on your physician's team can help you by facilitating care more promptly, often allowing for  earlier appointments, access to diagnostic testing, procedures, and other specialty referrals.     I appreciate the  opportunity to care for you  Thank You   Camie Heinz,PA-C

## 2024-07-30 NOTE — Telephone Encounter (Unsigned)
 Copied from CRM [id number]. Topic: Referral - Question >> Jul 15, 2024 10:03 AM Sophia H wrote: Reason for CRM: Spoke with Alana who received a referral for the patient and is needing the clinic to send over the last few office visit notes for the patient. Referral was to endocrinology - [phone] >> Jul 30, 2024  8:31 AM Deleta RAMAN wrote: Paulina is calling regarding office notes please contact [phone]

## 2024-08-02 NOTE — Telephone Encounter (Signed)
 Spoke with referral coordinator and sent over recent lab and office notes to endo office.

## 2024-08-05 ENCOUNTER — Ambulatory Visit: Admitting: Physician Assistant

## 2024-08-05 DIAGNOSIS — M7630 Iliotibial band syndrome, unspecified leg: Secondary | ICD-10-CM | POA: Diagnosis not present

## 2024-08-05 NOTE — Progress Notes (Unsigned)
 Office Visit Note   Patient: [name] [name]           Date of Birth: 10/21/1981           MRN: [id number] Visit Date: 08/05/2024              Requested by: Billy Philippe SAUNDERS, NP [address] [place],  [place] [address] PCP: Billy Philippe SAUNDERS, NP  Chief Complaint  Patient presents with  . Right Leg - Follow-up  . Left Leg - Follow-up      HPI: 43 y/o female with chronic aches and pains sometimes in the low back, hips and upper thighs.  She denies trauma or recent surgeries.  She states she has had a diagnosis of Hashimoto and feels this along with perimenopausal changes may be contributing to her symptoms.  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  She recently got remarried and has a 45 year old son.               Of note she has had periods of weight gain and was managing it on Wegovy .  This was no longer covered by insurance so she had to stop.  Recently she has ordered compounding GLP 1 medications on line and is using it every other week.               She has started walking for exercise  Assessment & Plan: Visit Diagnoses: No diagnosis found.  Plan: ***  Follow-Up Instructions: No follow-ups on file.   Ortho Exam  Patient is alert, oriented, no adenopathy, well-dressed, normal affect, normal respiratory effort. ***    Imaging: No results found. No images are attached to the encounter.  Labs: Lab Results  Component Value Date   HGBA1C 5.3 05/13/2024   HGBA1C 5.5 11/04/2023   HGBA1C 5.2 10/25/2022   ESRSEDRATE 15 05/13/2024   REPTSTATUS 05/11/2017 FINAL 05/08/2017   CULT  05/08/2017    NO GROUP A STREP (S.PYOGENES) ISOLATED Performed at [facility], [address] [place], [place] [address]    LABORGA NO GROWTH 04/03/2015     Lab Results  Component Value Date   ALBUMIN 4.2 05/13/2024   ALBUMIN 4.0 04/03/2024   ALBUMIN 4.0 11/05/2016    Lab Results  Component Value Date   MG 2.0 11/04/2023   MG 2.2  10/25/2022   MG 2.1 10/23/2021   Lab Results  Component Value Date   VD25OH 59.22 05/13/2024   VD25OH 44 11/04/2023   VD25OH 37 10/25/2022    No results found for: PREALBUMIN    Latest Ref Rng & Units 05/13/2024   12:16 PM 04/03/2024   12:56 AM 11/04/2023    3:46 PM  CBC EXTENDED  WBC 4.0 - 10.5 K/uL 4.9  7.5  7.3   RBC 3.87 - 5.11 Mil/uL 4.70  5.07  4.39   Hemoglobin 12.0 - 15.0 g/dL 85.8  84.5  86.5   HCT 36.0 - 46.0 % 42.0  45.9  40.9   Platelets 150.0 - 400.0 K/uL 293.0  361  327   NEUT# 1.4 - 7.7 K/uL 2.7  3.7  4,234   Lymph# 0.7 - 4.0 K/uL 1.6  3.0       There is no height or weight on file to calculate BMI.  Orders:  No orders of the defined types were placed in this encounter.  No orders of the defined types were placed in this encounter.  Procedures: No procedures performed  Clinical Data: No additional findings.  ROS:  All other systems negative, except as noted in the HPI. Review of Systems  Objective: Vital Signs: There were no vitals taken for this visit.  Specialty Comments:  No specialty comments available.  PMFS History: Patient Active Problem List   Diagnosis Date Noted  . GERD (gastroesophageal reflux disease) 12/25/2023  . Thyromegaly 04/23/2019  . Obesity (BMI 35.0-39.9 without comorbidity) 02/03/2019  . Vitamin D  deficiency 08/04/2018  . Hashimoto's thyroiditis 11/06/2016  . Situational stress 11/24/2014  . History of HSV-1 infection 02/16/2010   Past Medical History:  Diagnosis Date  . Abnormal Pap smear 2002   Colpo and Laser HPV  . Allergy   . Depression   . Former smoker   . GERD (gastroesophageal reflux disease)   . Headache    migraines  . Hiatal hernia   . HSV-1 (herpes simplex virus 1) infection 02/2010   positive on peri-anal c & s  . Hx of migraines 03/07/2016  . Obesity   . Thyroid  disease   . Vaginal Pap smear, abnormal     Family History  Problem Relation Age of Onset  . Depression Mother   .  Suicidality Mother   . Alcohol abuse Mother   . Thyroid  cancer Maternal Aunt   . Cancer Maternal Aunt        CLL  . ALS Maternal Uncle 54  . Leukemia Paternal Aunt 19       CLL  . ALS Maternal Grandmother   . Breast cancer Maternal Grandmother 55  . Cancer Maternal Grandmother   . Alzheimer's disease Maternal Grandfather   . Prostate cancer Maternal Grandfather   . Cancer Maternal Grandfather   . Lung cancer Paternal Grandfather   . Colon cancer Neg Hx   . Esophageal cancer Neg Hx   . Stomach cancer Neg Hx   . Rectal cancer Neg Hx     Past Surgical History:  Procedure Laterality Date  . BREAST BIOPSY Right 08/20/2023   MM RT BREAST BX W LOC DEV 1ST LESION IMAGE BX SPEC STEREO GUIDE 08/20/2023 [facility]  . CESAREAN SECTION N/A 06/21/2016   Procedure: CESAREAN SECTION;  Surgeon: Ovid All, MD;  [facility];  Service: Obstetrics;  Laterality: N/A;  . CHOLECYSTECTOMY  2004  . CHOLECYSTECTOMY, LAPAROSCOPIC  11/18/2002  . COLPOSCOPY  11/18/2000   HPV, laser  . ESOPHAGOGASTRODUODENOSCOPY    . TONSILLECTOMY AND ADENOIDECTOMY  11/18/2000   Social History   Occupational History    Employer: [employer]  Tobacco Use  . Smoking status: Former    Current packs/day: 0.00    Average packs/day: 0.1 packs/day for 5.0 years (0.6 ttl pk-yrs)    Types: Cigarettes    Start date: 06/19/1998    Quit date: 06/20/2003    Years since quitting: 21.1  . Smokeless tobacco: Never  . Tobacco comments:    in college  Vaping Use  . Vaping status: Never Used  Substance and Sexual Activity  . Alcohol use: Yes    Comment: Once a month  . Drug use: No  . Sexual activity: Yes    Partners: Male    Birth control/protection: Condom, Pill

## 2024-08-06 ENCOUNTER — Encounter: Payer: Self-pay | Admitting: Physician Assistant

## 2024-08-10 ENCOUNTER — Other Ambulatory Visit: Payer: Self-pay | Admitting: Endocrinology

## 2024-08-10 DIAGNOSIS — E049 Nontoxic goiter, unspecified: Secondary | ICD-10-CM

## 2024-08-13 ENCOUNTER — Ambulatory Visit
Admission: RE | Admit: 2024-08-13 | Discharge: 2024-08-13 | Disposition: A | Discharge status: Home / Self Care | Source: Ambulatory Visit | Attending: Endocrinology | Admitting: Endocrinology

## 2024-08-13 DIAGNOSIS — E049 Nontoxic goiter, unspecified: Secondary | ICD-10-CM

## 2024-09-20 ENCOUNTER — Encounter: Payer: Self-pay | Admitting: Radiology

## 2024-09-24 ENCOUNTER — Ambulatory Visit: Admitting: Physician Assistant

## 2024-10-16 ENCOUNTER — Other Ambulatory Visit: Payer: Self-pay | Admitting: Family Medicine

## 2024-10-16 DIAGNOSIS — E669 Obesity, unspecified: Secondary | ICD-10-CM

## 2024-10-16 DIAGNOSIS — G43009 Migraine without aura, not intractable, without status migrainosus: Secondary | ICD-10-CM

## 2024-10-28 ENCOUNTER — Ambulatory Visit (INDEPENDENT_AMBULATORY_CARE_PROVIDER_SITE_OTHER): Admitting: Family Medicine

## 2024-10-28 ENCOUNTER — Encounter: Payer: Self-pay | Admitting: Family Medicine

## 2024-10-28 VITALS — BP 122/76 | HR 60 | Temp 97.6°F | Ht 62.0 in | Wt 194.0 lb

## 2024-10-28 DIAGNOSIS — G43009 Migraine without aura, not intractable, without status migrainosus: Secondary | ICD-10-CM | POA: Insufficient documentation

## 2024-10-28 DIAGNOSIS — E66812 Obesity, class 2: Secondary | ICD-10-CM | POA: Diagnosis not present

## 2024-10-28 DIAGNOSIS — E6609 Other obesity due to excess calories: Secondary | ICD-10-CM | POA: Diagnosis not present

## 2024-10-28 DIAGNOSIS — Z6835 Body mass index (BMI) 35.0-35.9, adult: Secondary | ICD-10-CM | POA: Diagnosis not present

## 2024-10-28 MED ORDER — TOPIRAMATE 50 MG PO TABS: 50.0000 mg | ORAL_TABLET | Freq: Every day | ORAL | 1 refills | Status: AC

## 2024-10-28 MED ORDER — TIRZEPATIDE-WEIGHT MANAGEMENT 2.5 MG/0.5ML ~~LOC~~ SOLN: 2.5000 mg | SUBCUTANEOUS | 0 refills | Status: AC

## 2024-10-28 NOTE — Progress Notes (Signed)
 Established Patient Office Visit   Subjective:  Patient ID: [name] [name], female    DOB: January 06, 1981  Age: 43 y.o. MRN: [id number]  Chief Complaint  Patient presents with   Medical Management of Chronic Issues    6 month follow up     HPI Migraines: Chronic. Patient is taking Topiramate  50mg  daily, initally was prescribed BID for weight loss. Prior to giving birth years ago, she took medication for migraines at a higher dose. However, she is still taking for migraines now. Not had a migraine in months, at least 2 months.   Since last visit, she tried Zepbound (April-October). She had significant hair loss that started in September. She lost 20-25Ibs while on the medication. However, she has gained some of the weight. She would like to start back on medication until the first of the year to help her lose back the weight. She reports it really helps with food cravings and not eating as much. At the first of the year, she reports her insurance is changing to her spouses and Zepbound is covered. However, she is contemplating on either staying on Zepbound at the first of the year or switching to Wegovy . She has previously done Wegovy  injections with no complications. Reason for stopping Wegovy  is due to insurance coverage.   Since last visit, patient is being care by [facility] with Dr. Littie Caffey. She reports she this provider was supportive of being on Zepbound. She reports she has learned a lot about Hashimoto's Thyroiditis.  ROS See HPI above     Objective:   BP 122/76   Pulse 60   Temp 97.6 F (36.4 C) (Oral)   Ht 5' 2 (1.575 m)   Wt 194 lb (88 kg)   SpO2 99%   BMI 35.48 kg/m  Wt Readings from Last 3 Encounters:  10/28/24 194 lb (88 kg)  07/30/24 183 lb 4 oz (83.1 kg)  05/13/24 189 lb (85.7 kg)     Physical Exam Vitals reviewed.  Constitutional:      General: She is not in acute distress.    Appearance: Normal appearance. She is  obese. She is not ill-appearing, toxic-appearing or diaphoretic.  HENT:     Head: Normocephalic and atraumatic.  Eyes:     General:        Right eye: No discharge.        Left eye: No discharge.     Conjunctiva/sclera: Conjunctivae normal.  Cardiovascular:     Rate and Rhythm: Normal rate and regular rhythm.     Heart sounds: Normal heart sounds. No murmur heard.    No friction rub. No gallop.  Pulmonary:     Effort: Pulmonary effort is normal. No respiratory distress.     Breath sounds: Normal breath sounds.  Musculoskeletal:        General: Normal range of motion.  Skin:    General: Skin is warm and dry.  Neurological:     General: No focal deficit present.     Mental Status: She is alert and oriented to person, place, and time. Mental status is at baseline.  Psychiatric:        Mood and Affect: Mood normal.        Behavior: Behavior normal.        Thought Content: Thought content normal.        Judgment: Judgment normal.      Assessment & Plan:  Migraine without aura and without status migrainosus, not intractable Assessment &  Plan: Effective and controlled. Continue Topiramate  50mg  daily. Refilled medication.   Orders: -     Topiramate ; Take 1 tablet (50 mg total) by mouth daily.  Dispense: 90 tablet; Refill: 1  Class 2 obesity due to excess calories without serious comorbidity with body mass index (BMI) of 35.0 to 35.9 in adult Assessment & Plan: -Discussed about Wegovy  vs. Zepbound. Prescribed Zepbound 0.25mg  injection every 7 days for 4 doses through [facility].  -Recommend to send a message at the first of the year about weight loss medication, either to continue Zepbound through new insurance or go back with Wegovy .   Orders: -     Tirzepatide-Weight Management; Inject 2.5 mg into the skin once a week for 4 doses.  Dispense: 2 mL; Refill: 0  -Continue care with [facility]. Natanael Saladin, NP

## 2024-10-28 NOTE — Assessment & Plan Note (Signed)
-  Discussed about Wegovy  vs. Zepbound. Prescribed Zepbound 0.25mg  injection every 7 days for 4 doses through [facility].  -Recommend to send a message at the first of the year about weight loss medication, either to continue Zepbound through new insurance or go back with Wegovy .

## 2024-10-28 NOTE — Assessment & Plan Note (Signed)
 Effective and controlled. Continue Topiramate  50mg  daily. Refilled medication.

## 2024-10-28 NOTE — Patient Instructions (Addendum)
-  It was great to see you today. -Refilled Topiramate  for migraines.  -Discussed about Wegovy  vs. Zepbound. Prescribed Zepbound 0.25mg  injection every 7 days for 4 doses through [facility].  -Recommend to send a message at the first of the year about weight loss medication, either to continue Zepbound through new insurance or go back with Wegovy .  -Continue care with [facility].

## 2024-11-04 ENCOUNTER — Encounter: Payer: BC Managed Care – PPO | Admitting: Nurse Practitioner

## 2024-11-05 ENCOUNTER — Ambulatory Visit: Admitting: Family Medicine

## 2024-12-07 ENCOUNTER — Encounter: Payer: Self-pay | Admitting: Family Medicine

## 2025-03-28 ENCOUNTER — Ambulatory Visit: Admitting: Internal Medicine
